# Patient Record
Sex: Female | Born: 1945 | Race: White | Hispanic: No | Marital: Married | Smoking: Never smoker
Health system: Southern US, Community
[De-identification: ages and names within clinical notes are randomized; demographics above are authoritative.]

## PROBLEM LIST (undated history)

## (undated) DIAGNOSIS — K589 Irritable bowel syndrome without diarrhea: Secondary | ICD-10-CM

## (undated) DIAGNOSIS — G43909 Migraine, unspecified, not intractable, without status migrainosus: Secondary | ICD-10-CM

## (undated) DIAGNOSIS — L309 Dermatitis, unspecified: Secondary | ICD-10-CM

## (undated) DIAGNOSIS — Z955 Presence of coronary angioplasty implant and graft: Secondary | ICD-10-CM

## (undated) DIAGNOSIS — I341 Nonrheumatic mitral (valve) prolapse: Secondary | ICD-10-CM

## (undated) DIAGNOSIS — E785 Hyperlipidemia, unspecified: Secondary | ICD-10-CM

## (undated) DIAGNOSIS — K219 Gastro-esophageal reflux disease without esophagitis: Secondary | ICD-10-CM

## (undated) DIAGNOSIS — C4490 Unspecified malignant neoplasm of skin, unspecified: Secondary | ICD-10-CM

## (undated) DIAGNOSIS — N189 Chronic kidney disease, unspecified: Secondary | ICD-10-CM

## (undated) DIAGNOSIS — K5909 Other constipation: Secondary | ICD-10-CM

## (undated) DIAGNOSIS — I10 Essential (primary) hypertension: Secondary | ICD-10-CM

## (undated) DIAGNOSIS — I2510 Atherosclerotic heart disease of native coronary artery without angina pectoris: Secondary | ICD-10-CM

## (undated) DIAGNOSIS — D649 Anemia, unspecified: Secondary | ICD-10-CM

## (undated) DIAGNOSIS — K279 Peptic ulcer, site unspecified, unspecified as acute or chronic, without hemorrhage or perforation: Secondary | ICD-10-CM

## (undated) HISTORY — DX: Atherosclerotic heart disease of native coronary artery without angina pectoris: I25.10

## (undated) HISTORY — DX: Unspecified malignant neoplasm of skin, unspecified: C44.90

## (undated) HISTORY — DX: Presence of coronary angioplasty implant and graft: Z95.5

## (undated) HISTORY — PX: CHOLECYSTECTOMY: SHX55

## (undated) HISTORY — DX: Essential (primary) hypertension: I10

## (undated) HISTORY — DX: Peptic ulcer, site unspecified, unspecified as acute or chronic, without hemorrhage or perforation: K27.9

## (undated) HISTORY — DX: Anemia, unspecified: D64.9

## (undated) HISTORY — DX: Dermatitis, unspecified: L30.9

## (undated) HISTORY — PX: ABDOMINAL HYSTERECTOMY: SHX81

## (undated) HISTORY — DX: Gastro-esophageal reflux disease without esophagitis: K21.9

## (undated) HISTORY — PX: APPENDECTOMY: SHX54

## (undated) HISTORY — DX: Other constipation: K59.09

## (undated) HISTORY — DX: Nonrheumatic mitral (valve) prolapse: I34.1

## (undated) HISTORY — DX: Migraine, unspecified, not intractable, without status migrainosus: G43.909

## (undated) HISTORY — PX: TENDON REPAIR: SHX5111

## (undated) HISTORY — DX: Hyperlipidemia, unspecified: E78.5

## (undated) HISTORY — DX: Chronic kidney disease, unspecified: N18.9

## (undated) HISTORY — PX: TRIGGER FINGER RELEASE: SHX641

---

## 1987-05-10 DIAGNOSIS — K279 Peptic ulcer, site unspecified, unspecified as acute or chronic, without hemorrhage or perforation: Secondary | ICD-10-CM

## 1987-05-10 HISTORY — DX: Peptic ulcer, site unspecified, unspecified as acute or chronic, without hemorrhage or perforation: K27.9

## 1996-05-09 HISTORY — PX: HAND SURGERY: SHX662

## 2002-05-09 DIAGNOSIS — K589 Irritable bowel syndrome without diarrhea: Secondary | ICD-10-CM

## 2002-05-09 HISTORY — DX: Irritable bowel syndrome, unspecified: K58.9

## 2003-06-30 DIAGNOSIS — K219 Gastro-esophageal reflux disease without esophagitis: Secondary | ICD-10-CM | POA: Insufficient documentation

## 2004-05-09 HISTORY — PX: SIGMOID RESECTION / RECTOPEXY: SUR1294

## 2004-08-07 ENCOUNTER — Emergency Department: Payer: Self-pay | Admitting: Unknown Physician Specialty

## 2004-08-12 ENCOUNTER — Inpatient Hospital Stay: Payer: Self-pay | Admitting: Endocrinology

## 2004-09-09 ENCOUNTER — Inpatient Hospital Stay: Payer: Self-pay | Admitting: Surgery

## 2005-05-09 HISTORY — PX: CARDIAC SURGERY: SHX584

## 2005-09-28 ENCOUNTER — Other Ambulatory Visit: Payer: Self-pay

## 2005-09-28 ENCOUNTER — Ambulatory Visit: Payer: Self-pay | Admitting: Cardiology

## 2005-09-28 DIAGNOSIS — I2510 Atherosclerotic heart disease of native coronary artery without angina pectoris: Secondary | ICD-10-CM | POA: Insufficient documentation

## 2006-02-07 ENCOUNTER — Ambulatory Visit: Payer: Self-pay | Admitting: Family Medicine

## 2006-05-09 HISTORY — PX: CORONARY ARTERY BYPASS GRAFT: SHX141

## 2006-11-13 ENCOUNTER — Ambulatory Visit: Payer: Self-pay | Admitting: Cardiology

## 2006-11-28 ENCOUNTER — Ambulatory Visit: Payer: Self-pay | Admitting: Thoracic Surgery (Cardiothoracic Vascular Surgery)

## 2006-11-30 ENCOUNTER — Encounter: Payer: Self-pay | Admitting: Thoracic Surgery (Cardiothoracic Vascular Surgery)

## 2006-11-30 ENCOUNTER — Ambulatory Visit (HOSPITAL_COMMUNITY)
Admission: RE | Admit: 2006-11-30 | Discharge: 2006-11-30 | Payer: Self-pay | Admitting: Thoracic Surgery (Cardiothoracic Vascular Surgery)

## 2006-12-04 ENCOUNTER — Inpatient Hospital Stay (HOSPITAL_COMMUNITY)
Admission: RE | Admit: 2006-12-04 | Discharge: 2006-12-08 | Payer: Self-pay | Admitting: Thoracic Surgery (Cardiothoracic Vascular Surgery)

## 2006-12-04 ENCOUNTER — Ambulatory Visit: Payer: Self-pay | Admitting: Thoracic Surgery (Cardiothoracic Vascular Surgery)

## 2006-12-21 ENCOUNTER — Ambulatory Visit: Payer: Self-pay | Admitting: Thoracic Surgery (Cardiothoracic Vascular Surgery)

## 2006-12-21 ENCOUNTER — Encounter
Admission: RE | Admit: 2006-12-21 | Discharge: 2006-12-21 | Payer: Self-pay | Admitting: Thoracic Surgery (Cardiothoracic Vascular Surgery)

## 2007-08-02 DIAGNOSIS — R931 Abnormal findings on diagnostic imaging of heart and coronary circulation: Secondary | ICD-10-CM | POA: Insufficient documentation

## 2007-08-21 ENCOUNTER — Ambulatory Visit: Payer: Self-pay | Admitting: Thoracic Surgery (Cardiothoracic Vascular Surgery)

## 2008-02-19 ENCOUNTER — Inpatient Hospital Stay: Payer: Self-pay | Admitting: Internal Medicine

## 2008-02-22 ENCOUNTER — Emergency Department: Payer: Self-pay | Admitting: Emergency Medicine

## 2008-08-07 IMAGING — CR DG CHEST 1V PORT
1 series · 1 of 1 positions shown · non-contrast
Comparison: 11/30/2006.

Exam: Chest, one view.

HISTORY: A status post CABG.

[view not recorded]
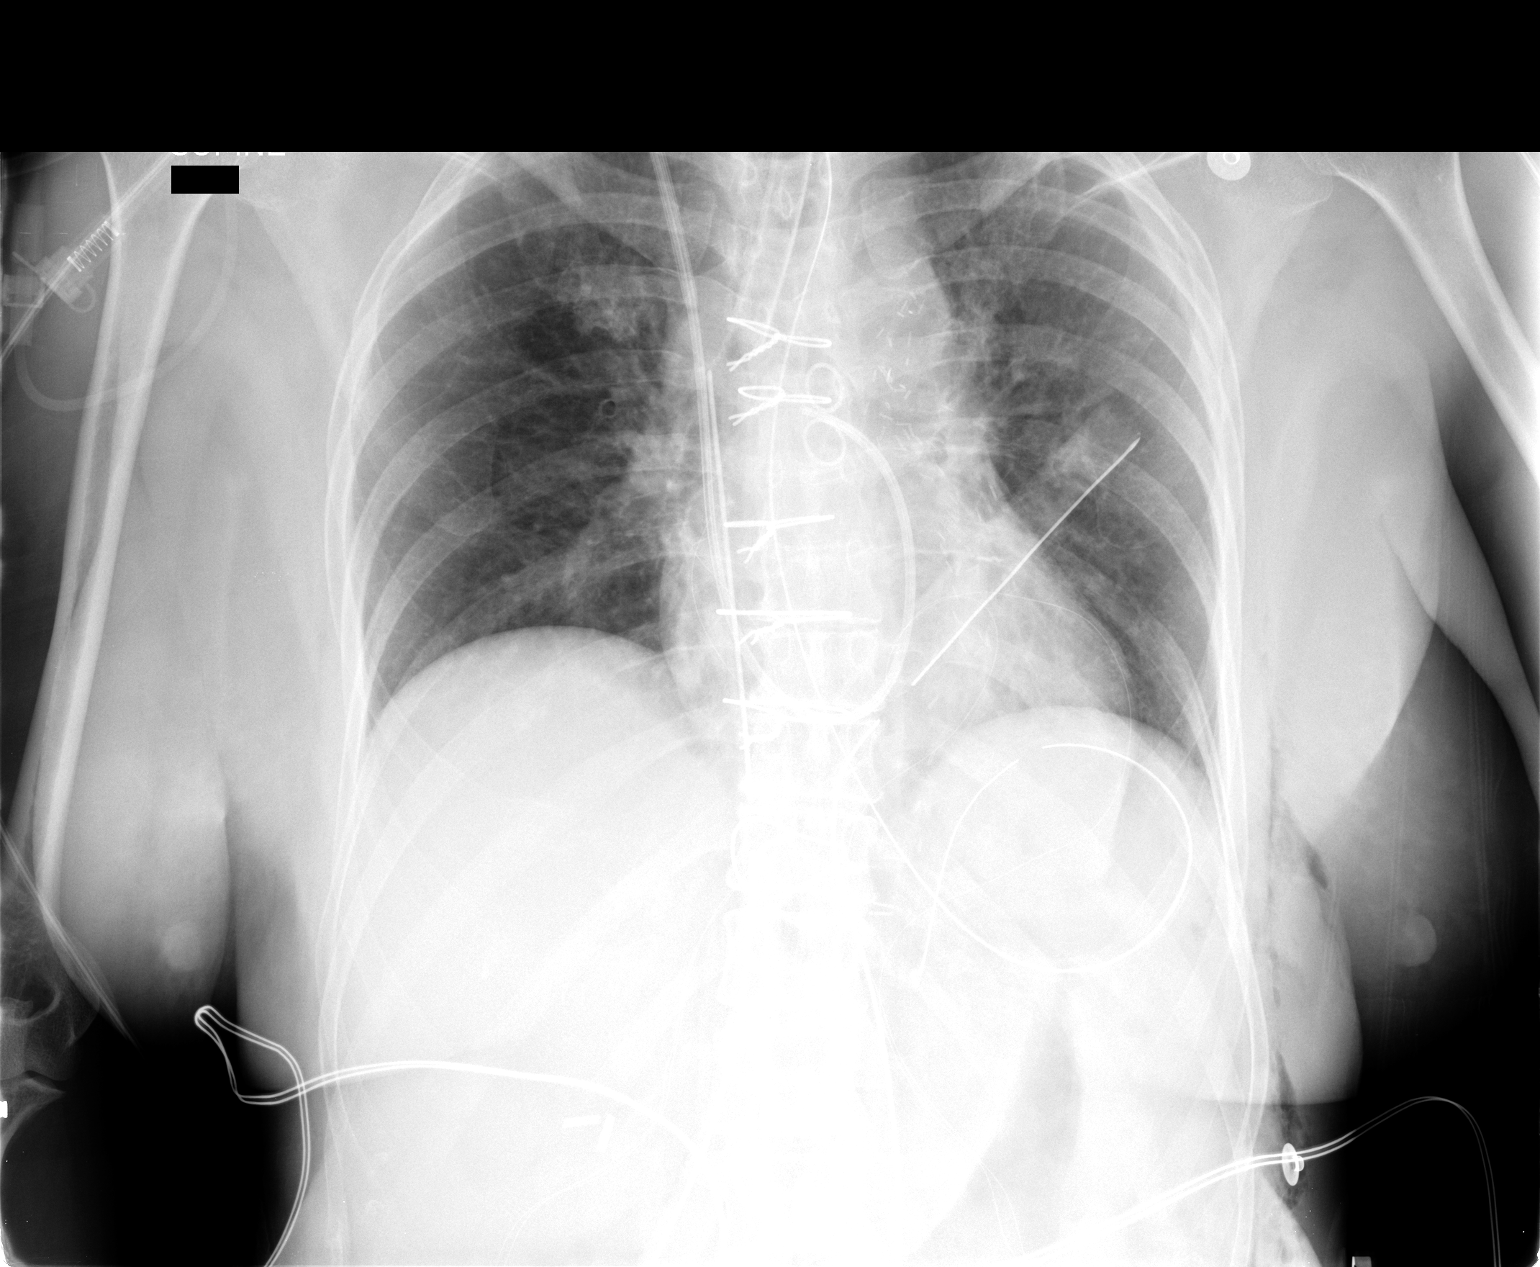

[1 of 1 positions shown; findings below may reference images not displayed]

FINDINGS: The ET tube tip is just above the carina and directed towards the
right mainstem bronchus.

Swan-Ganz catheter tip is in the main pulmonary artery

There is a mediastinal drain and left-sided chest tube in place. No pneumothorax
is identified.

Heart size is normal there is no effusions. No pulmonary edema or airspace
disease is identified.
IMPRESSION: 1. Expected postoperative changes from a CABG procedure.
2. Left-sided chest tube without pneumothorax.

## 2008-08-10 IMAGING — CR DG CHEST 2V
2 series · 2 of 2 positions shown · non-contrast
Comparison: none

CLINICAL DATA: CABG.  
 CHEST - 2 VIEWS: 
 Comparison is made to yesterday?s exam.

[w chest pa]
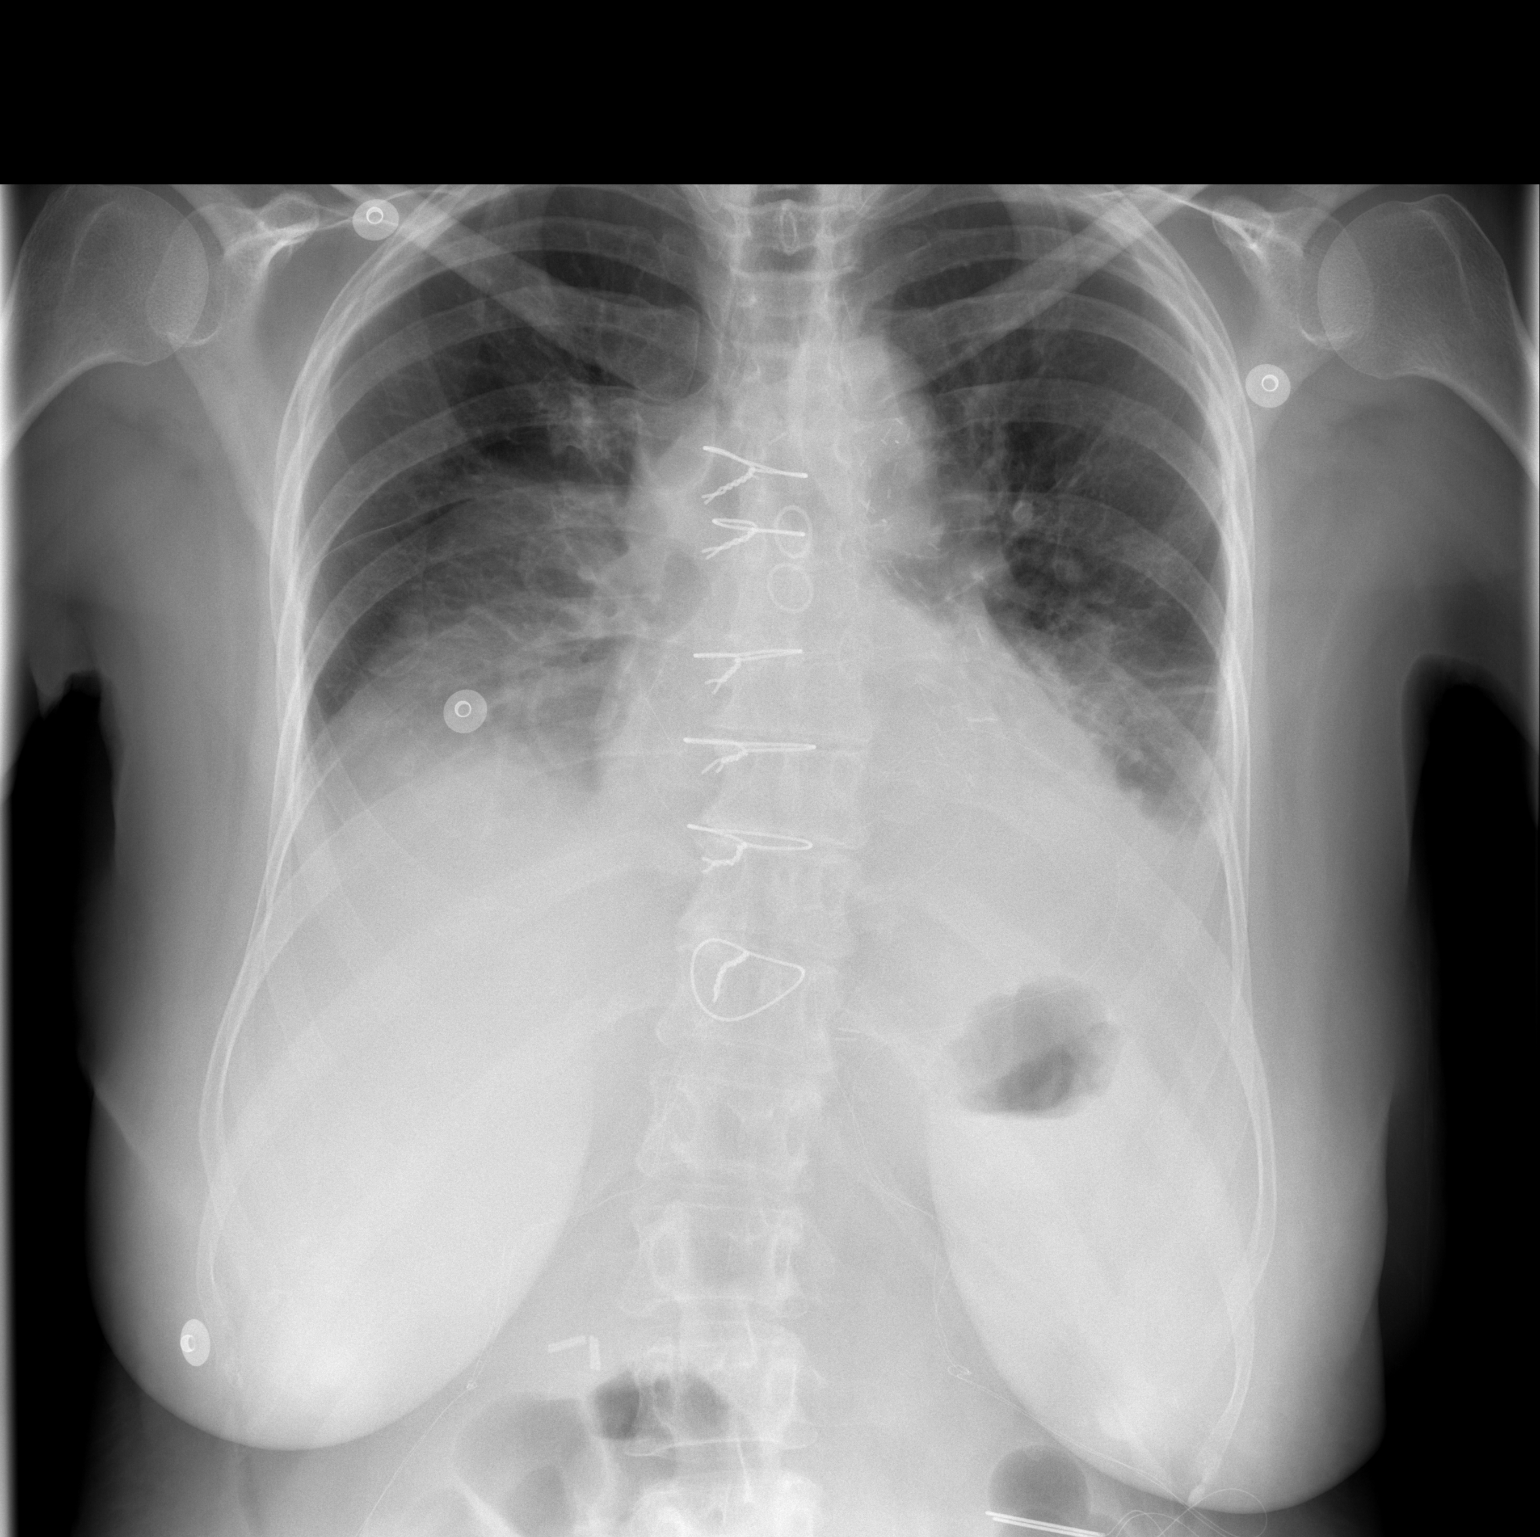

[w chest lat]
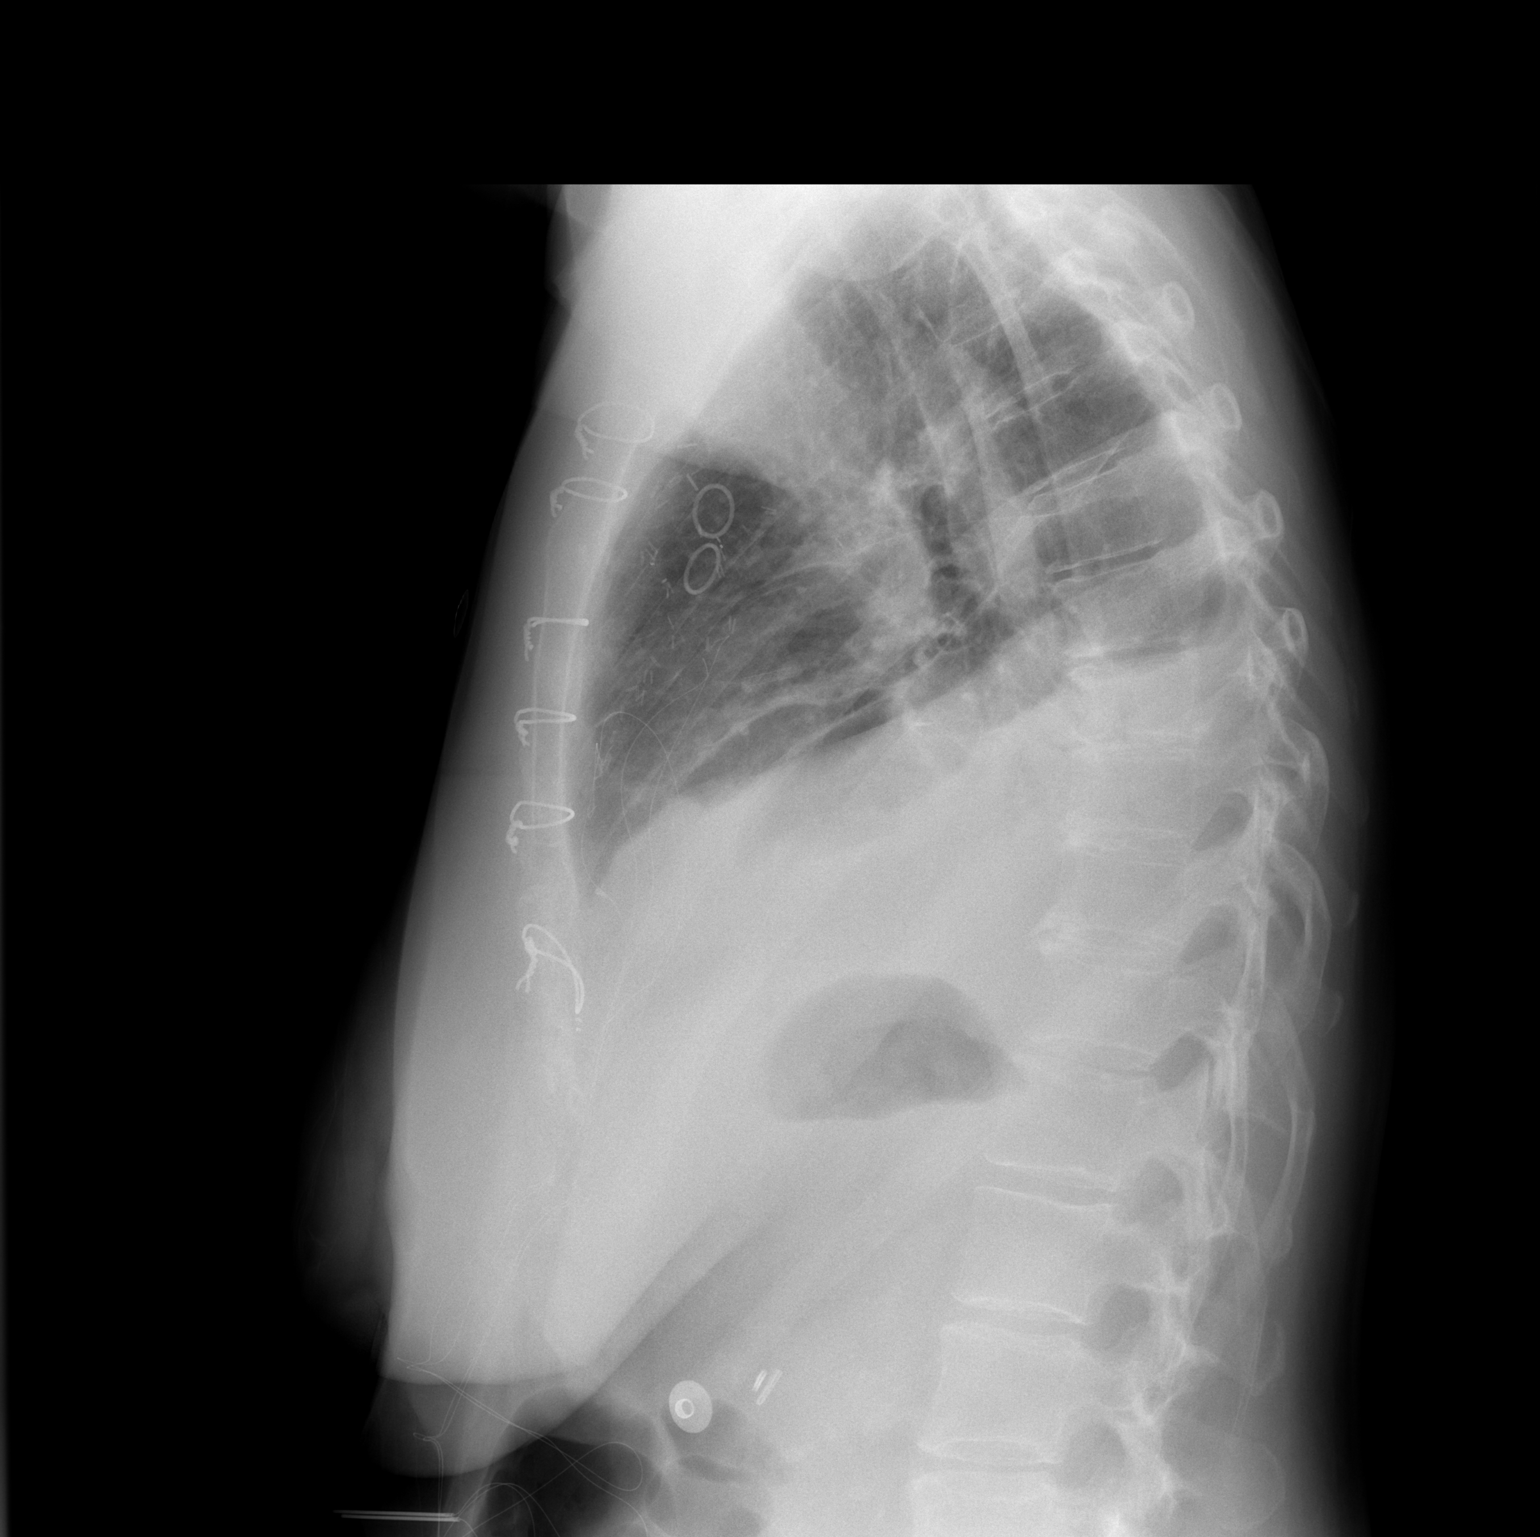

[2 of 2 positions shown; findings below may reference images not displayed]

FINDINGS: The catheter sheath has been removed.  Bilateral lower lobar atelectasis and small pleural effusions are again appreciated.  No pneumothorax.  IMPRESSION: 
 Bilateral lower lobar atelectasis/consolidations and pleural effusions persist.

## 2008-10-13 ENCOUNTER — Ambulatory Visit: Payer: Self-pay | Admitting: Unknown Physician Specialty

## 2008-11-13 ENCOUNTER — Ambulatory Visit: Payer: Self-pay | Admitting: Family Medicine

## 2008-12-01 ENCOUNTER — Ambulatory Visit: Payer: Self-pay | Admitting: Unknown Physician Specialty

## 2009-05-12 ENCOUNTER — Ambulatory Visit: Payer: Self-pay | Admitting: Oncology

## 2009-08-14 ENCOUNTER — Ambulatory Visit: Payer: Self-pay | Admitting: Nephrology

## 2010-02-16 ENCOUNTER — Ambulatory Visit: Payer: Self-pay | Admitting: Internal Medicine

## 2010-05-09 HISTORY — PX: CATARACT EXTRACTION: SUR2

## 2010-05-12 ENCOUNTER — Ambulatory Visit: Payer: Self-pay | Admitting: Oncology

## 2010-05-18 ENCOUNTER — Ambulatory Visit: Payer: Self-pay | Admitting: Unknown Physician Specialty

## 2010-05-19 LAB — PATHOLOGY REPORT

## 2010-06-09 ENCOUNTER — Ambulatory Visit: Payer: Self-pay | Admitting: Oncology

## 2010-07-08 ENCOUNTER — Ambulatory Visit: Payer: Self-pay | Admitting: Oncology

## 2010-07-14 ENCOUNTER — Ambulatory Visit: Payer: Self-pay | Admitting: Unknown Physician Specialty

## 2010-09-21 NOTE — Letter (Signed)
December 21, 2006   Isaias Cowman, M.D.   Re:  MALEENA, KAISER              DOB:  [birth year]   Dear Cristie Hem:   I saw Jontel Hartshorn in the office today.  As you know, she is a very  nice 65 year old woman who was recently found to have an ostial 70% left  main stenosis at catheterization.  I did coronary bypass grafting x3,  grafting her LAD, diagonal, and obtuse marginal on July 28th.  She  returned to the office today for followup, and is really doing well at  this point in time.  She is having some difficulty with her lack of  energy and stamina, but I reassured her that this should improve  dramatically over the next 2 to 3 weeks.  Overall, I think she is on  course for a normal recovery.   Thank you very much for allowing me to see Ms. Reidel, and to  participate in her care.  I would be happy to see her again at any time  if I could be of any further assistance.  Please do not hesitate to  contact me.   Revonda Standard Roxan Hockey, M.D.  Electronically Signed   SCH/MEDQ  D:  12/21/2006  T:  12/22/2006  Job:  [id number]   cc:   Juluis Pitch

## 2010-09-21 NOTE — Assessment & Plan Note (Signed)
OFFICE VISIT   [name], [name]  DOB:  12/09/1945                                        December 21, 2006  CHART #:  [id number]   [name] is a 65 year old woman who underwent coronary artery bypass  grafting x3 on July 28.  She had left main disease.  We did a left  mammary to her LAD, a saphenous vein graft to her first diagonal, as  well as a saphenous vein graft to her first obtuse marginal.  The vein  was harvested endoscopically.  She did well postoperatively and did not  have any significant complications.  She was mildly fluid overloaded and  was discharged home on Lasix for a week.   Since that time, she states that she is having minimal incisional pain  but still is taking 1-2 pain tablets a day.  She also describes a funny  breathing issue that occurs sporadically where she takes short, gasping  breaths for a few seconds and then it resolves spontaneously.  She says  this has happened a couple of times a day for the past 3 to 4 days.   PHYSICAL EXAMINATION:  GENERAL:  Ms. [name] is a 65 year old white  female in no acute distress.  VITAL SIGNS:  Blood pressure 128/76, pulse 76, respirations 18, O2  saturation 99% on room air.  RESPIRATORY:  She has decreased breath sounds at the left base,  otherwise lungs are clear.  CARDIAC:  Regular rate and rhythm, normal S1 and S2.  Her incisions are  healing well with no sign of infection. Her sternum is stable.  She has  no peripheral edema.   Chest x-ray shows a small left effusion.   IMPRESSION:  Ms. [name] really is doing quite well at this point in  time.  She is just about 2-1/2 weeks out from surgery.  She is very  anxious but I tried to reassure her that all of her symptoms were normal  for someone after undergoing a major operation and the she is still very  early in the process of recovery and it would take really several more  months for her to fully recover; although I suspect that  within the next  2 to 3 weeks you will see a significant increase in her energy level and  stamina.   I gave her a prescription for oxycodone 5 mg tablets, 1 to 2 as needed  for pain, 20 tablets.  She was instructed to call during office hours if  she needs additional pain medication; we would be happy to call that in  for her.  She has an appointment to see Dr. Saralyn Pilar next Wednesday.  At this point, with her wounds healing well, unless she has additional  problems, I will not ask her to come back to [facility] to see me but to  continue to follow up with Dr. Saralyn Pilar, but I would be happy to see  her at any time if I can be of any further assistance with her care.   Revonda Standard Roxan Hockey, M.D.  Electronically Signed   SCH/MEDQ  D:  12/21/2006  T:  12/22/2006  Job:  [id number]   cc:   Isaias Cowman, MD  Juluis Pitch

## 2010-09-21 NOTE — H&P (Signed)
NAMEDAMON, RENNISON            ACCOUNT NO.:  [id number]   MEDICAL RECORD NO.:  [id number]          PATIENT TYPE:  OUT   LOCATION:  VASC                         FACILITY:  [place]   PHYSICIAN:  Glynda Jaeger, M.D.  DATE OF BIRTH:  May 04, 1946   DATE OF ADMISSION:  11/30/2006  DATE OF DISCHARGE:  11/30/2006                              HISTORY & PHYSICAL   PROCEDURE:  Intraoperative transesophageal echocardiography.   Ms. [name] is a 65 year old white female with a history of  coronary artery disease and shortness of breath with dyspnea on  exertion.  She was also felt to possibly have mitral valve prolapse.  Intraoperative transesophageal echocardiography which was requested to  evaluate the mitral valve to assess for any other valvular pathology and  to assess left ventricular and right ventricular function.   The patient was brought to the operating room at [facility] and  general anesthesia was induced without difficulty.  The trachea was  intubated without difficulty.  The transesophageal echocardiography  probe was then inserted into the esophagus without difficulty.   IMPRESSION:  1. Mitral valve:  The mitral valve appeared normal.  There was no      evidence of prolapse or fluttering.  There was trace mitral      insufficiency with a central jet.  There was mild mitral annular      calcification.  There was normal leaflet excursion.  2. Aortic valve:  The aortic valve opened normally.  There was mild      thickening of the leaflets.  There was no evidence of aortic      insufficiency.  3. Left ventricle:  There was normal left ventricular function.  Left      ventricular wall thickness measured 0.95 cm in diastole at the mid      papillary level of the anterior and posterior walls.  There was      good contractility in all segments.  Interrogated ejection fraction      was estimated at 60%.  There was no thrombus noted in the left      ventricular  apex.  4. Right ventricle:  The right ventricular function appeared normal.      There was good contractility of the right ventricular free wall and      normal right ventricular size.  5. Tricuspid valve:  There was 2+ tricuspid insufficiency, which      appeared to be in a central jet, but the tricuspid valve      structurally appeared to be intact.  6. Interatrial septum:  The interatrial septum was intact without      evidence of patent foramen ovale by color Doppler and bubble study.  7. Left atrium:  The left atrium showed no evidence of thrombus either      in the left atrial cavity or left atrial appendage.  8. Ascending aorta:  The ascending aorta appeared to have a normal      contour, mild thickening of the wall but no evidence of significant      atheromatous  disease.  9. Descending aorta:  The descending aorta appeared normal and      measured 1.95 cm in diameter with no significant atheromatous      disease.          ______________________________  Glynda Jaeger, M.D.    DCJ/MEDQ  D:  12/04/2006  T:  12/05/2006  Job:  [id number]

## 2010-09-21 NOTE — Op Note (Signed)
[name], [name]            ACCOUNT NO.:  [id number]   MEDICAL RECORD NO.:  [id number]          PATIENT TYPE:  INP   LOCATION:  2312                         FACILITY:  [facility]   PHYSICIAN:  Revonda Standard. Roxan Hockey, M.D.DATE OF BIRTH:  Aug 09, 1945   DATE OF PROCEDURE:  12/04/2006  DATE OF DISCHARGE:                               OPERATIVE REPORT   PREOPERATIVE DIAGNOSIS:  Left main coronary disease with progressive  angina.   POSTOPERATIVE DIAGNOSIS:  Left main coronary disease with progressive  angina.   PROCEDURE:  The median sternotomy, off-pump coronary bypass grafting x3  (left internal mammary artery to LAD, saphenous vein graft to first  diagonal, saphenous vein graft to obtuse marginal one), endoscopic vein  harvest left thigh.   SURGEON:  Dr. Roxan Hockey   ASSISTANT:  Janese Banks, SA   ANESTHESIA:  General.   FINDINGS:  Small but good-quality targets.  Transesophageal  echocardiography revealed no evidence of mitral prolapse.  There was  trace mitral regurgitation, 2+ tricuspid regurgitation.   CLINICAL NOTE:  The patient is a 65 year old woman who presents with  progressive angina.  She has preexisting coronary disease and has  previously had a stent to her LAD.  She underwent cardiac  catheterization was found to have a 70% ostial left main stenosis.  There also was significant stenosis in the takeoff of the first diagonal  branch of the LAD.  The patient was advised to undergo coronary bypass  grafting for survival benefit and relief of symptoms in the setting of  left main disease.  Indications, risks, benefits and alternatives were  discussed in detail with the patient as was the possibility of  proceeding with off-pump grafting if technically possible. The patient  understood, accepted risks of surgery agreed to proceed.   OPERATIVE NOTE:  The patient was brought to the preop holding area on  December 04, 2006.  There lines placed by anesthesia for  monitoring  arterial, central venous and pulmonary arterial pressure.  ECG leads  placed for continuous telemetry.  Intravenous antibiotics were  administered.  She was taken to the operating room, anesthetized and  intubated.  A Foley catheter was placed.  The chest, abdomen and legs  were prepped and draped in the usual sterile fashion.  Transesophageal  echocardiography was performed by Dr. Roberts Gaudy and revealed good  left ventricular function good right ventricular function with no  significant prolapse of the mitral valve.  There was trace mitral  insufficiency.  There was 2+ tricuspid insufficiency.   A median sternotomy was performed and the left internal mammary artery  was harvested using standard technique.  Simultaneously incision in the  medial aspect of the left leg at the level of the knee and greater  saphenous vein was harvested from the left thigh endoscopically.  The  saphenous vein was of good quality as was the mammary artery. The  heparin dose was administered prior to dividing the distal end of the  mammary artery with good flow through the cut end of the vessel.   The pericardium was opened.  The ascending aorta was inspected and was  free of atherosclerotic disease.  The heart itself was normal size with  good left ventricular function.  The patient was placed in steep  Trendelenburg position.  Deep pericardial stay sutures were placed and  covered with Rumel tourniquets to prevent epicardial laceration.  Traction on these prolapsed the heart anteriorly and out of the chest.  The patient tolerated this well hemodynamically. Using the Guidant  Expose off-pump grafting system the following distal anastomoses were  performed.   First the apex of the heart was retracted anteriorly and to the right  exposing the posterior lateral aspect of the heart.  The stabilizing bar  was placed at site selected for anastomosis on the obtuse marginal. The  arteriotomy was  performed, a 2-mm shunt would not pass but 1.5 mm shunt  passed in the vessel easily. Reversed saphenous vein graft was  anastomosed end-to-side with a running 7-0 Prolene suture and there was  good hemostasis.  Good stabilization.  The patient tolerated this well  hemodynamically with no signs of ischemia. At completion anastomosis the  shunt was removed and heparinized saline was infused through the graft  with excellent flow. There was good hemostasis.   Next the apex of the heart was retracted more inferiorly to expose the  anterolateral wall and first diagonal branch LAD was identified and was  a small vessel, too small for a shunt to be used.  Therefore a Silastic  tape was placed proximally.  Traction was placed on the tape to allow  visualization and arteriotomy was performed.  This was a 1 mm vessel.  The vein graft was of good quality and was anastomosed end-to-side with  running 7-0 Prolene suture.  The anastomosis was probed proximally,  distally. At completion there was adequate flow through the graft and  good hemostasis at the anastomosis.   Next the left internal mammary artery was brought through a window in  the pericardium.  The distal end was beveled and was anastomosed end-to-  side to the LAD.  The LAD was stabilized. An arteriotomy was performed  with 1.5 mm shunt was placed in the vessel.  The vessel barely  accommodated the 1.5 mm shunt and the anastomosis was performed with  running 0 Prolene suture.  An attempt to remove the shunt revealed the  suture had traversed the shunt.  Therefore the suture was removed.  The  anastomosis was taken down the mammary was recut. The shunt was removed  Silastic tapes were placed proximally and distally and the anastomosis  was reperformed with a running 8-0 Prolene suture.  This time without a  shunt in place. The anastomosis and mammary probed easily with a 1.5-mm  probe at the completion of the anastomosis. The anastomosis  had good  hemostasis.  The mammary pedicle was tacked epicardial surface with 6-0  Prolene sutures.   The heart then was allowed to rest in its natural position.  The patient  had no hemodynamic instability or signs of ischemia during any the  anastomoses. The vein grafts then were cut to length and the proximal  vein graft anastomoses were performed to 4.3 mm punch aortotomies using  the heart string three proximal anastomotic device.  The patient  remained in Trendelenburg position until after the completion of the  proximal anastomoses and de-airing of the vein grafts. All proximal and  distal anastomoses were inspected for hemostasis, epicardial pacing  wires were placed on the right ventricle and right atrium.  A test dose  of  protamine was administered and was well tolerated. The remaining  protamine was administered without incident.  The pericardium was  reapproximated over the heart with interrupted 3-0 silk sutures.  It  came together easily without tension. The left pleural and single  mediastinal chest tubes were placed through separate subcostal  incisions.  The sternum was closed with interrupted heavy gauge  stainless steel wires.  The patient transiently had drop in the cardiac  output with otherwise normal hemodynamics. She was however bradycardic  and she was atrially paced at 90 beats per minute. Volume administration  resulted in  marked improvement in her cardiac index without need for inotropic  support. The remaining incisions closed in standard fashion.  Subcuticular closure used for the skin.  All sponge, needle and sponge  counts were correct at the end of procedure.  The patient is taken from  the operating room to the surgical intensive care unit in good  condition.      Revonda Standard Roxan Hockey, M.D.  Electronically Signed     SCH/MEDQ  D:  12/04/2006  T:  12/05/2006  Job:  [id number]   cc:   Isaias Cowman, M.D.

## 2010-09-21 NOTE — Consult Note (Signed)
NEW PATIENT CONSULTATION   [name], [name]  DOB:  [birth year]                                        November 28, 2006  CHART #:  [id number]   PRIMARY CARDIOLOGIST:  Dr. Isaias Cowman, [facility].   CHIEF COMPLAINT:  Chest tightness.   HISTORY OF PRESENT ILLNESS:  The patient is a 65 year old, white female  with a history of coronary artery disease dating back to March 2007.  At  that time, she had chest pain symptoms and underwent evaluation  culminating in a bare metal stent placement in the LAD on July 29, 2005.  Most recently she has developed symptoms of increasing chest  tightness symptoms with minimal activity.  She also describes left hand  numbness and tingling and pain that does radiate to the left arm and  sometimes to the jaw region.  This is also associated with shortness of  breath, dyspnea on exertion and two-pillow orthopnea.  She denies  paroxysmal nocturnal dyspnea.  She has a nonproductive cough  intermittently.  She also admits to polyuria and nocturia.  She denies  diaphoresis associated with the symptoms, but does have occasional  nausea and vomiting.  She has persistent reflux symptoms.  She denies  TIA or cerebrovascular accident symptoms.  She denies palpitations,  denies history of arrhythmias.  She does admit to increasing fatigue.  She underwent cardiac catheterization by Dr. Saralyn Pilar on November 13, 2006.  This revealed an ostial 70% left main lesion.  Additionally, there was a  60% lesion diagonal-1 coronary artery.  There was also a proximal 30%,  nonobstructive right coronary lesion.  The stent was felt to be patent.  Due to the increasing symptoms and the severity of the left main  anatomy, she has been recommended surgical revascularization and she was  referred to Dr. Modesto Charon for evaluation.  Dr. Roxan Hockey has  evaluated the patient and her studies and agrees with recommendations to  proceed with surgery.   PAST MEDICAL HISTORY:  1. Coronary artery disease as described above.  2. Hypertension.  3. Hyperlipidemia.  4. Irritable bowel syndrome.  5. History of a heart murmur with a definitive diagnosis in 2005, of      mitral valve prolapse.  She is uncertain of the actual method of      this diagnosis, but states that her heart murmur symptoms date back      to 1991.   PAST SURGICAL HISTORY:  1. Partial sigmoid resection in 2006.  2. Total hysterectomy in 1982.  3. Left carpal tunnel in 2003.  4. Right carpal tunnel in 2004.  5. Cholecystectomy.  6. Three surgeries for trigger fingers bilateral hands.   ALLERGIES:  PENICILLIN which causes a rash.  COMPAZINE she reports  worsening nausea and vomiting.  REGLAN causes neurological symptoms.  She also reports intolerance to all STATINS primarily muscle cramping in  the lower extremities.   CURRENT MEDICATIONS:  1. Aspirin 81 mg daily.  2. Magnesium supplements daily.  3. Metoprolol ER 50 mg daily.  4. MiraLax daily.  5. Omeprazole 20 mg p.r.n.  6. Triamterene/hydrochlorothiazide 37.5/25 mg daily.  7. Vitamin B and vitamin C daily.   REVIEW OF SYSTEMS:  See the history of present illness for pertinent  positives and negative.  Otherwise, it is notable for persistent reflux  symptoms and  chronic constipation.   SOCIAL HISTORY:  She is married with no children.  No tobacco use.  No  alcohol use.  She is currently unemployed.  She formerly worked in  Veterinary surgeon type work.   FAMILY HISTORY:  Mother deceased at age 73 from cerebrovascular  accident.  Father deceased at age 75.  He had both extracranial  cerebrovascular occlusive disease as well as a history of coronary  revascularization and history of stroke.  She has one brother who is  alive.  He does have diabetes mellitus.   PHYSICAL EXAMINATION:  Vital Signs:  Blood pressure 129/76, heart rate  62, respirations 18, oxygen saturations 98% on room air.  General:  This  is a  healthy-appearing, 65 year old, Caucasian female in no acute  distress.  HEENT:  Normocephalic, atraumatic.  Pupils equal round and  reactive to light.  Extraocular movements intact.  Oral mucosa is pink  and moist.  There are no lesions.  Sclerae nonicteric.  She does have  partial top dentition.  Neck:  Supple, no jugular venous distention.  No  bruits.  No lymphadenopathy.  Pulmonary:  Symmetrical on inspiration,  unlabored.  Clear breath sounds without wheezes, rhonchi or crackles.  Cardiac:  Regular rate and rhythm without murmurs, rubs or gallops.  Abdomen:  Soft, nontender, nondistended, normoactive bowel sounds, no  masses, no bruits, no hepatosplenomegaly.  Genitalia/Rectal:  Deferred.  Extremities:  No edema, no varicosities, no venous stasis changes.  She  has a palpable dorsalis pedis and posterior tibial bilaterally.  Neurologic:  Nonfocal.  She is alert and oriented x4.  Gait is steady.  Muscle strength is 5+ and equal bilaterally.  All findings are  symmetrical.   ASSESSMENT:  Left main coronary artery disease with unstable angina  symptoms.   PLAN:  Coronary artery bypass graft by Dr. Roxan Hockey early next week.  She will also undergo a transesophageal echocardiogram at that time to  better evaluate her valves and if there are any findings that require  surgical intervention, this will be included in her procedure.  Dr.  Roxan Hockey has evaluated the patient and discussed all risks, benefits  and alternatives and she agrees to proceed.   John Giovanni, P.A.-C.   Loren Racer  D:  11/28/2006  T:  11/28/2006  Job:  [id number]

## 2010-09-21 NOTE — Letter (Signed)
November 28, 2006   Dr. Peggye Form  [place], [facility]   Re:  [name], [name]              DOB:  Sep 24, 1945   Dear [name]:   I saw [name] in the office today.  As you know, she is a very  nice 65 year old woman who has recently been having significant  exertional anginal symptoms.  At catheterization, she had an ostial 70%  left main stenosis with damping of the catheter.  I agree with your  assessment that coronary bypass grafting is indicated, and we have  scheduled her for surgery on Monday, July 28.  She tells me that she has  had a history of mitral valve prolapse, so we will do a TEE  intraoperatively.  Although, I suspect we will not need to do anything  about the valve at the time of surgery.  Again, thank you very much for  sending [name] to see [name], and I will keep you informed of our  progress.   Sincerely,   Revonda Standard. Roxan Hockey, MD   Revonda Standard Roxan Hockey, M.D.  Electronically Signed   SCH/MEDQ  D:  11/28/2006  T:  11/29/2006  Job:  [id number]

## 2010-09-21 NOTE — Discharge Summary (Signed)
[name], [name]            ACCOUNT NO.:  [id number]   MEDICAL RECORD NO.:  [id number]          PATIENT TYPE:  INP   LOCATION:  2039                         FACILITY:  [facility]   PHYSICIAN:  Revonda Standard. 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   IN-HOSPITAL DIAGNOSES:  1. Acute blood loss anemia postoperatively.  2. Volume overload postoperatively.   SECONDARY DIAGNOSES:  1. History of coronary artery disease dating back March 2007, status      post bare metal stent placement in left anterior descending on      July 29, 2005.  2. Hypertension.  3. Hyperlipidemia.  4. Irritable bowel syndrome.  5. History of heart murmur with a definite diagnosis 2005 of mitral      valve prolapse.  6. Status post partial sigmoid resection in 2006.  7. Status post total hysterectomy in 1982.  8. Status post left carpal tunnel in 2003.  9. Status post right carpal tunnel in 2004.  10.Status post cholecystectomy.  11.Status post three surgeries for trigger fingers bilateral hand.   IN-HOSPITAL OPERATIONS AND PROCEDURES:  Off-pump coronary artery bypass  grafting x3 using a left internal mammary artery to left anterior  descending, saphenous vein graft to first diagonal, saphenous vein graft  to obtuse marginal one.  Endoscopic vein harvesting to left side.   HISTORY AND PHYSICAL AND HOSPITAL COURSE:  The patient is a 59-year  female who presents with progressive angina.  She has pre-existing  coronary disease and has previously had a stent to her LAD.  The patient  underwent cardiac catheterization and was found to have 70% ostial left  main stenosis.  There also was significant stenosis in the takeoff of  the first diagonal branch of the LAD.  Following catheterization, the  patient was evaluated by Dr. Roxan Hockey.   Dr. Roxan Hockey discussed  with the patient undergoing coronary artery bypass grafting.  Risks and  benefits were discussed with the patient.  The patient acknowledged her  understanding and agreed to proceed.  Surgery was scheduled for December 04, 2006.  For details of the patient's past medical history and physical  exam, please see dictated H&P.   The patient was taken to the operating room November 26, 2006, where she  underwent off-pump coronary artery bypass grafting x3 using a left  internal mammary artery to left anterior descending, saphenous vein  graft to first diagonal, saphenous vein graft to obtuse marginal one.  The patient had endoscopic vein harvesting from the left thigh.  The  patient tolerated this procedure well and was transferred to the  intensive care unit in stable condition.  Postoperatively, the patient  was noted to be hemodynamically stable.  She was extubated the evening  of surgery.  Post extubation, the patient noted to be alert and oriented  x4.  The patient's postoperative course was pretty much unremarkable.  The patient was able to be weaned from all drips on postop day #1.  The  chest tubes and Swan-Ganz catheter were discontinued postop day #1.  The  patient was out of bed, ambulating with assistance and tolerating diet  well.  She was able to be transferred out to 2000 postop day #2.  The  patient did develop acute blood loss anemia postoperatively.  Hemoglobin  and hematocrit were 8.8 and 26% postoperative day one.  The patient did  not require any transfusion and hemoglobin and hematocrit were  monitored.  Noted that they did remain stable and by postop day #3  slight decrease, but patient asymptomatic with 8.4 and 24.9%.  Postoperatively, the patient was also need to be volume overloaded.  The  patient was started on diuretics postoperatively.  She is still  currently 10 kg above her preoperative weight.  Plan to continue  diuretics at discharge.   Preoperatively, the patient diagnosed with  urinary tract infection.  She was started on ciprofloxacin and continued  postoperatively for seven-day total course.  The patient remained in  normal sinus rhythm postoperatively.  Her pulmonary status was stable.  She was able to be weaned off oxygen, sating greater than 90% on room  air.  The patient's incisions were clean, dry and intact and healing  well.  She was out of bed, ambulating well.  The patient was tolerating  diet well.  Vital signs were noted to remained stable and she remained  afebrile.  Blood sugars were followed postoperatively.  They did remain  stable.  The patient's hemoglobin A1c preoperatively was 5.5.   LABORATORY DATA:  Postop day #3 showed a white count 7.7, hemoglobin of  8.4, hematocrit 24.9, platelet count 161.  Sodium of 140, potassium 3.8,  chloride of 102, bicarbonate 31, BUN of 12, creatinine of 1.26, glucose  113.   Patient is tentatively ready for discharge home in the next one to two  days pending she remains stable.   FOLLOW-UP APPOINTMENTS:  Follow-up appointment has been arranged with  Dr. Roxan Hockey for December 21, 2006, at 1:00 p.m..  The patient will  need to obtain a PA and lateral chest x-ray 30 minutes prior to this  appointment.  The patient will need to follow up with Dr. Leafy Half in 2  weeks.  She will need to contact his office to arrange this appointment.   ACTIVITY:  Patient instructed no driving until released to do so, no  heavy lifting over 10 pounds.  She is told to ambulate three to four  times per day, progress as tolerated.  Continue breathing exercises.   INCISIONAL CARE:  The patient is told to shower, washing her incisions  using soap and water.  She is to contact the office if she develops any  drainage or opening from any of her incision sites.   DIET:  The patient educated on diet to be low-fat, low-salt.   DISCHARGE MEDICATIONS:  1. Aspirin 325 mg daily.  2. Toprol XL 25  mg daily.  3. Prilosec as needed.  4. Ciprofloxacin 500 mg b.i.d. x2 days.  5. Lasix 40 mg daily x7 days.  6. Potassium chloride 20 mEq daily x7 days.  7. MiraLax 17 grams daily.  8. Vitamin B daily.  9. Vitamin C daily.  10.Magnesium oxide 400 mg daily.  11.Dulcolax as used at home.  12.Oxycodone 5 mg 1-2 tablets q.4-6h. p.r.n. pain.      Darlin Coco, PA      Revonda Standard. Roxan Hockey, M.D.  Electronically Signed    KMD/MEDQ  D:  12/07/2006  T:  12/08/2006  Job:  [id number]   cc:   [employer]

## 2010-10-13 ENCOUNTER — Ambulatory Visit: Payer: Self-pay | Admitting: Oncology

## 2010-11-07 ENCOUNTER — Ambulatory Visit: Payer: Self-pay | Admitting: Oncology

## 2011-02-21 LAB — POCT I-STAT 4, (NA,K, GLUC, HGB,HCT)
Glucose, Bld: 86
Glucose, Bld: 87
Glucose, Bld: 95
Glucose, Bld: 95
HCT: 15 — ABNORMAL LOW
HCT: 18 — ABNORMAL LOW
HCT: 19 — ABNORMAL LOW
HCT: 26 — ABNORMAL LOW
Hemoglobin: 5.1 — CL
Hemoglobin: 6.1 — CL
Hemoglobin: 6.5 — CL
Operator id: 241461
Operator id: 3402
Potassium: 2.9 — ABNORMAL LOW
Potassium: 3.2 — ABNORMAL LOW
Potassium: 3.5
Potassium: 3.7
Sodium: 141
Sodium: 143
Sodium: 143

## 2011-02-21 LAB — I-STAT EC8
Acid-base deficit: 2
BUN: 13
BUN: 13
Bicarbonate: 23.7
Chloride: 101
Chloride: 107
Glucose, Bld: 128 — ABNORMAL HIGH
HCT: 23 — ABNORMAL LOW
HCT: 24 — ABNORMAL LOW
Hemoglobin: 7.8 — CL
Hemoglobin: 8.2 — ABNORMAL LOW
Operator id: 252761
Operator id: 274841
Potassium: 3.5
Sodium: 140
Sodium: 141
TCO2: 25
pCO2 arterial: 45.4 — ABNORMAL HIGH
pH, Arterial: 7.326 — ABNORMAL LOW

## 2011-02-21 LAB — CBC
HCT: 24.9 — ABNORMAL LOW
HCT: 25 — ABNORMAL LOW
HCT: 25.5 — ABNORMAL LOW
Hemoglobin: 12.1
Hemoglobin: 7.2 — CL
Hemoglobin: 8.4 — ABNORMAL LOW
Hemoglobin: 8.5 — ABNORMAL LOW
Hemoglobin: 8.6 — ABNORMAL LOW
MCHC: 33.6
MCHC: 33.6
MCHC: 34
MCHC: 34.5
MCV: 84
MCV: 87.6
MCV: 87.8
MCV: 88
Platelets: 158
Platelets: 161
Platelets: 182
Platelets: 190
RBC: 2.49 — ABNORMAL LOW
RBC: 2.83 — ABNORMAL LOW
RBC: 2.86 — ABNORMAL LOW
RBC: 2.91 — ABNORMAL LOW
RBC: 4.21
RDW: 12.3
RDW: 12.6
RDW: 13.3
RDW: 13.3
RDW: 13.6
WBC: 7.3
WBC: 7.7
WBC: 8.3
WBC: 8.7

## 2011-02-21 LAB — TYPE AND SCREEN: Antibody Screen: NEGATIVE

## 2011-02-21 LAB — APTT: aPTT: 22 — ABNORMAL LOW

## 2011-02-21 LAB — POCT I-STAT 3, ART BLOOD GAS (G3+)
Acid-base deficit: 2
Acid-base deficit: 6 — ABNORMAL HIGH
Bicarbonate: 20
Bicarbonate: 20.9
O2 Saturation: 100
O2 Saturation: 84
Operator id: 288291
Patient temperature: 33.1
TCO2: 21
TCO2: 22
TCO2: 22
pCO2 arterial: 25.3 — ABNORMAL LOW
pCO2 arterial: 28.3 — ABNORMAL LOW
pCO2 arterial: 41.3
pH, Arterial: 7.295 — ABNORMAL LOW
pH, Arterial: 7.46 — ABNORMAL HIGH
pH, Arterial: 7.504 — ABNORMAL HIGH
pO2, Arterial: 176 — ABNORMAL HIGH
pO2, Arterial: 208 — ABNORMAL HIGH
pO2, Arterial: 54 — ABNORMAL LOW

## 2011-02-21 LAB — BASIC METABOLIC PANEL
BUN: 12
BUN: 12
CO2: 30
CO2: 31
Chloride: 109
Creatinine, Ser: 1.06
GFR calc Af Amer: 55 — ABNORMAL LOW
GFR calc Af Amer: 58 — ABNORMAL LOW
GFR calc non Af Amer: 45 — ABNORMAL LOW
GFR calc non Af Amer: 44 — ABNORMAL LOW
GFR calc non Af Amer: 48 — ABNORMAL LOW
Glucose, Bld: 100 — ABNORMAL HIGH
Glucose, Bld: 113 — ABNORMAL HIGH
Potassium: 3.6
Potassium: 3.7
Potassium: 3.8
Sodium: 141
Sodium: 139

## 2011-02-21 LAB — URINE MICROSCOPIC-ADD ON

## 2011-02-21 LAB — MAGNESIUM
Magnesium: 1.9
Magnesium: 2.3

## 2011-02-21 LAB — BASIC METABOLIC PANEL WITH GFR
BUN: 13
CO2: 30
Calcium: 8.7
Chloride: 102
Creatinine, Ser: 1.2
GFR calc non Af Amer: 46 — ABNORMAL LOW
Glucose, Bld: 108 — ABNORMAL HIGH
Potassium: 3.7
Sodium: 139

## 2011-02-21 LAB — CREATININE, SERUM
Creatinine, Ser: 0.98
GFR calc Af Amer: >60
GFR calc non Af Amer: 58 — ABNORMAL LOW

## 2011-02-21 LAB — BLOOD GAS, ARTERIAL
Drawn by: 206361
FIO2: 0.21
pCO2 arterial: 42.6
pH, Arterial: 7.374
pO2, Arterial: 89.9

## 2011-02-21 LAB — URINALYSIS, ROUTINE W REFLEX MICROSCOPIC
Glucose, UA: NEGATIVE
Ketones, ur: NEGATIVE
Nitrite: NEGATIVE
Specific Gravity, Urine: 1.015
pH: 6

## 2011-02-21 LAB — POCT I-STAT GLUCOSE
Glucose, Bld: 86
Operator id: 3402

## 2011-02-21 LAB — COMPREHENSIVE METABOLIC PANEL
ALT: 11
AST: 19
Alkaline Phosphatase: 51
CO2: 21
GFR calc Af Amer: 59 — ABNORMAL LOW
Glucose, Bld: 89
Potassium: 4.2
Sodium: 138
Total Protein: 7

## 2011-02-21 LAB — ABO/RH: ABO/RH(D): A POS

## 2011-02-21 LAB — HEMOGLOBIN A1C: Hgb A1c MFr Bld: 5.5

## 2011-02-22 ENCOUNTER — Ambulatory Visit: Payer: Self-pay | Admitting: Internal Medicine

## 2011-05-10 HISTORY — PX: SKIN CANCER EXCISION: SHX779

## 2011-05-10 HISTORY — PX: CORONARY ANGIOPLASTY WITH STENT PLACEMENT: SHX49

## 2011-11-23 ENCOUNTER — Ambulatory Visit: Payer: Self-pay | Admitting: Cardiology

## 2011-11-23 LAB — CK TOTAL AND CKMB (NOT AT ARMC)
CK, Total: 47 U/L (ref 21–215)
CK-MB: 1.3 ng/mL (ref 0.5–3.6)

## 2011-11-24 LAB — BASIC METABOLIC PANEL
BUN: 15 mg/dL (ref 7–18)
Calcium, Total: 8.2 mg/dL — ABNORMAL LOW (ref 8.5–10.1)
EGFR (African American): >60
EGFR (Non-African Amer.): 56 — ABNORMAL LOW
Glucose: 90 mg/dL (ref 65–99)
Osmolality: 285 (ref 275–301)
Potassium: 3.9 mmol/L (ref 3.5–5.1)
Sodium: 143 mmol/L (ref 136–145)

## 2012-03-27 ENCOUNTER — Ambulatory Visit: Payer: Self-pay | Admitting: Internal Medicine

## 2012-04-25 ENCOUNTER — Ambulatory Visit: Payer: Self-pay | Admitting: Cardiology

## 2012-04-25 LAB — BASIC METABOLIC PANEL
Anion Gap: 10 (ref 7–16)
Co2: 22 mmol/L (ref 21–32)
Creatinine: 1.26 mg/dL (ref 0.60–1.30)
EGFR (African American): 52 — ABNORMAL LOW
Osmolality: 283 (ref 275–301)

## 2012-12-27 ENCOUNTER — Ambulatory Visit: Payer: Self-pay | Admitting: Orthopedic Surgery

## 2013-04-01 ENCOUNTER — Ambulatory Visit: Payer: Self-pay | Admitting: Internal Medicine

## 2013-05-06 ENCOUNTER — Ambulatory Visit: Payer: Self-pay | Admitting: Cardiology

## 2013-05-23 ENCOUNTER — Ambulatory Visit: Payer: Self-pay | Admitting: Cardiology

## 2013-05-23 LAB — CK TOTAL AND CKMB (NOT AT ARMC)
CK, Total: 41 U/L (ref 21–215)
CK-MB: 0.9 ng/mL (ref 0.5–3.6)

## 2013-05-24 LAB — BASIC METABOLIC PANEL
ANION GAP: 4 — AB (ref 7–16)
BUN: 21 mg/dL — AB (ref 7–18)
CALCIUM: 7.9 mg/dL — AB (ref 8.5–10.1)
Chloride: 97 mmol/L — ABNORMAL LOW (ref 98–107)
Co2: 36 mmol/L — ABNORMAL HIGH (ref 21–32)
Creatinine: 1.32 mg/dL — ABNORMAL HIGH (ref 0.60–1.30)
EGFR (African American): 49 — ABNORMAL LOW
GFR CALC NON AF AMER: 42 — AB
GLUCOSE: 91 mg/dL (ref 65–99)
Osmolality: 276 (ref 275–301)
Potassium: 2.3 mmol/L — CL (ref 3.5–5.1)
Sodium: 137 mmol/L (ref 136–145)

## 2013-06-20 ENCOUNTER — Encounter: Payer: Self-pay | Admitting: Cardiology

## 2013-07-07 ENCOUNTER — Encounter: Payer: Self-pay | Admitting: Cardiology

## 2013-08-07 ENCOUNTER — Encounter: Payer: Self-pay | Admitting: Cardiology

## 2013-09-06 ENCOUNTER — Encounter: Payer: Self-pay | Admitting: Cardiology

## 2013-09-16 DIAGNOSIS — N184 Chronic kidney disease, stage 4 (severe): Secondary | ICD-10-CM | POA: Insufficient documentation

## 2013-09-16 DIAGNOSIS — K589 Irritable bowel syndrome without diarrhea: Secondary | ICD-10-CM | POA: Insufficient documentation

## 2013-09-16 DIAGNOSIS — N189 Chronic kidney disease, unspecified: Secondary | ICD-10-CM | POA: Insufficient documentation

## 2013-09-16 DIAGNOSIS — N390 Urinary tract infection, site not specified: Secondary | ICD-10-CM | POA: Insufficient documentation

## 2013-09-16 DIAGNOSIS — D649 Anemia, unspecified: Secondary | ICD-10-CM | POA: Insufficient documentation

## 2013-10-07 ENCOUNTER — Encounter: Payer: Self-pay | Admitting: Cardiology

## 2013-12-11 DIAGNOSIS — IMO0001 Reserved for inherently not codable concepts without codable children: Secondary | ICD-10-CM | POA: Insufficient documentation

## 2013-12-11 DIAGNOSIS — E785 Hyperlipidemia, unspecified: Secondary | ICD-10-CM | POA: Insufficient documentation

## 2013-12-11 DIAGNOSIS — I25718 Atherosclerosis of autologous vein coronary artery bypass graft(s) with other forms of angina pectoris: Secondary | ICD-10-CM | POA: Insufficient documentation

## 2013-12-11 DIAGNOSIS — Z789 Other specified health status: Secondary | ICD-10-CM | POA: Insufficient documentation

## 2013-12-11 DIAGNOSIS — I25810 Atherosclerosis of coronary artery bypass graft(s) without angina pectoris: Secondary | ICD-10-CM | POA: Insufficient documentation

## 2013-12-11 DIAGNOSIS — Z955 Presence of coronary angioplasty implant and graft: Secondary | ICD-10-CM | POA: Insufficient documentation

## 2013-12-11 DIAGNOSIS — T50905A Adverse effect of unspecified drugs, medicaments and biological substances, initial encounter: Secondary | ICD-10-CM | POA: Insufficient documentation

## 2013-12-11 DIAGNOSIS — I10 Essential (primary) hypertension: Secondary | ICD-10-CM | POA: Insufficient documentation

## 2013-12-11 DIAGNOSIS — R1110 Vomiting, unspecified: Secondary | ICD-10-CM

## 2014-04-16 ENCOUNTER — Ambulatory Visit: Payer: Self-pay | Admitting: Nephrology

## 2014-04-18 ENCOUNTER — Ambulatory Visit: Payer: Self-pay | Admitting: Internal Medicine

## 2014-08-26 NOTE — Discharge Summary (Signed)
PATIENT NAME:  [name], LOFT MR#:  [id number] DATE OF BIRTH:  [birth year]  DATE OF ADMISSION:  11/23/2011 DATE OF DISCHARGE:  11/24/2011  FINAL DIAGNOSES:  1. Coronary artery disease.  2. Gastroesophageal reflux disease.   DISCHARGE MEDICATIONS:  1. Enteric aspirin 325 mg daily.  2. Plavix 75 mg daily.  3. Metoprolol succinate 25 mg daily.  4. Furosemide 40 mg daily.  5. Hyoscyamine 0.125 mg t.i.d. p.r.n.  6. Magnesium 500 mg daily.  7. Dulcolax 5 mg q.h. p.r.n.  8. Dexilant 60 mg daily.  9. Ranitidine 150 mg at bedtime.  10. Ondansetron 4 mg q.8 p.r.n.  11. Probiotic 1 capsule daily.   PROCEDURES:  1. Cardiac catheterization with selective coronary arteriography on 10/24/2011.  2. Percutaneous coronary intervention with coronary stent mid right coronary artery 11/23/2011.   HISTORY: Please see admission history and physical.   HOSPITAL COURSE: The patient underwent elective cardiac catheterization on 11/22/2011. Coronary arteriography revealed patent LIMA to LAD, patent saphenous vein graft to first obtuse marginal branch and occluded saphenous vein graft to first diagonal branch. There was a 75% stenosis in mid right coronary artery. The patient underwent percutaneous coronary intervention. The patient received a 2.25 x 16 mm drug-eluting Promus stent with an excellent angiographic result. Postprocedural CPK-MB were 47 and 1.3, respectively. On the morning of    11/24/2011 the patient was ambulating without difficulty and was discharged home in stable condition. She is scheduled to see me in follow-up in one week.   ____________________________ Isaias Cowman, MD ap:ap D: 11/24/2011 08:00:32 ET T: 11/24/2011 14:41:17 ET JOB#: [id number]  cc: Isaias Cowman, MD, <Dictator> Isaias Cowman MD ELECTRONICALLY SIGNED 12/06/2011 8:26

## 2014-08-30 NOTE — Discharge Summary (Signed)
PATIENT NAME:  [name], [name] MR#:  [id number] DATE OF BIRTH:  1945/07/29  DATE OF ADMISSION:  05/23/2013 DATE OF DISCHARGE:  05/24/2013  PRIMARY CARE PHYSICIAN:  Dr. Ginette Pitman.    FINAL DIAGNOSES: 1.  Coronary artery disease.  2. Hypertension.  3.  Hyperlipidemia.  4.  Statin intolerance.   MEDICATIONS:   1.  Aspirin 325 mg daily. 2.  Clopidogrel 75 mg daily. 3.  Furosemide 40 mg daily. 4.  Isosorbide mononitrate 60 mg daily. 5.  Metoprolol succinate 50 mg daily. 6.  Potassium chloride 10 mEq daily. 7.  Ranexa 500 mg b.i.d. 8.  Dulcolax 5 mg daily p.r.n. 9.  Dexilant 60 mg daily. 10.  Probiotic 1 capsule daily.  12.  Magnesium gluconate 1 tab daily. 13.  Promethazine 25 mg p.r.n. 14.  Nitroglycerin 0.4 mg p.r.n.   PROCEDURES: Percutaneous coronary intervention with coronary stent 05/23/2013.   HISTORY OF PRESENT ILLNESS: Please see admission H and [place] COURSE: The patient underwent elective percutaneous coronary intervention on 05/23/2013. The patient received a 2.75 x 12 mm Xience EX coronary stent in the ostium of a protected left main coronary artery. The patient tolerated the procedure well. Post procedural CPK-MB were 41 and 0.9 respectively. Following the procedure, the patient had an uneventful course in the CCU without recurrent chest pain. On the morning of 01/16/ 2015, the patient was ambulating without difficulty and was discharged home. She is scheduled to see me in follow-up in one week.    ____________________________ Isaias Cowman, MD ap:dp D: 05/24/2013 08:40:07 ET T: 05/24/2013 09:04:59 ET JOB#: [id number]  cc: Isaias Cowman, MD, <Dictator> Isaias Cowman MD ELECTRONICALLY SIGNED 06/25/2013 12:27

## 2014-11-04 ENCOUNTER — Encounter (INDEPENDENT_AMBULATORY_CARE_PROVIDER_SITE_OTHER): Payer: Self-pay

## 2014-11-04 ENCOUNTER — Ambulatory Visit (INDEPENDENT_AMBULATORY_CARE_PROVIDER_SITE_OTHER): Payer: Medicare Other | Admitting: Urology

## 2014-11-04 ENCOUNTER — Encounter: Payer: Self-pay | Admitting: Urology

## 2014-11-04 ENCOUNTER — Telehealth: Payer: Self-pay | Admitting: Urology

## 2014-11-04 VITALS — BP 129/72 | HR 74 | Ht 60.0 in | Wt 102.1 lb

## 2014-11-04 DIAGNOSIS — K279 Peptic ulcer, site unspecified, unspecified as acute or chronic, without hemorrhage or perforation: Secondary | ICD-10-CM | POA: Insufficient documentation

## 2014-11-04 DIAGNOSIS — N952 Postmenopausal atrophic vaginitis: Secondary | ICD-10-CM | POA: Diagnosis not present

## 2014-11-04 DIAGNOSIS — T7840XA Allergy, unspecified, initial encounter: Secondary | ICD-10-CM | POA: Insufficient documentation

## 2014-11-04 DIAGNOSIS — I341 Nonrheumatic mitral (valve) prolapse: Secondary | ICD-10-CM | POA: Insufficient documentation

## 2014-11-04 DIAGNOSIS — R339 Retention of urine, unspecified: Secondary | ICD-10-CM | POA: Diagnosis not present

## 2014-11-04 DIAGNOSIS — R310 Gross hematuria: Secondary | ICD-10-CM | POA: Diagnosis not present

## 2014-11-04 DIAGNOSIS — Z889 Allergy status to unspecified drugs, medicaments and biological substances status: Secondary | ICD-10-CM | POA: Insufficient documentation

## 2014-11-04 LAB — MICROSCOPIC EXAMINATION: Bacteria, UA: NONE SEEN

## 2014-11-04 LAB — URINALYSIS, COMPLETE
BILIRUBIN UA: NEGATIVE
Glucose, UA: NEGATIVE
Ketones, UA: NEGATIVE
Leukocytes, UA: NEGATIVE
NITRITE UA: NEGATIVE
PH UA: 5.5 (ref 5.0–7.5)
Protein, UA: NEGATIVE
RBC, UA: NEGATIVE
Specific Gravity, UA: 1.025 (ref 1.005–1.030)
UUROB: 0.2 mg/dL (ref 0.2–1.0)

## 2014-11-04 LAB — BLADDER SCAN AMB NON-IMAGING: SCAN RESULT: 0

## 2014-11-04 NOTE — Telephone Encounter (Signed)
Please call in the compounded estrogen for the patient to [facility].

## 2014-11-04 NOTE — Progress Notes (Signed)
11/04/2014 11:20 AM   Ellene Route 24-Dec-1945 [id number]  Referring provider: Murlean Iba, MD [address] [place], [place] [address]  Chief Complaint  Patient presents with  . Hematuria    referred by Dr. Candiss Norse nephrologist for hematuria     HPI: [name] is a 69 year old white female who is referred to [name] for microscopic hematuria.  Patient states she had microscopic hematuria on two occasions in the last 6 months.  She has not experienced any gross hematuria.  She has seen blood on the toilet paper after she wiped two months ago.  She has never smoked, she does not have a h/o recurrent UTI's, she does not have a history of stones, but she has been diagnosed with gout.  She has not experienced any flank pain, but she does experience suprapubic pain off and on during the day for the last several months.  She has a FM Hx of stones with her father and brothers having stones.    One month ago, she developed a red and swollen perineum.  She described it as an "orangutan's butt."  Her husband was prescribed a cream for balanitis.  She used the cream as well and the swelling in her perineum abated.    PMH: Past Medical History  Diagnosis Date  . Acid reflux   . CAD (coronary artery disease)   . Chronic kidney disease     Surgical History: Past Surgical History  Procedure Laterality Date  . Abdominal hysterectomy    . Tendon repair      right elbow  . Hand surgery  1998  . Trigger finger release    . Cholecystectomy    . Sigmoid resection / rectopexy  2006  . Cardiac surgery  2007    cardiac stent place  . Coronary artery bypass graft      triple  . Cataract extraction  2012  . Skin cancer excision  2013  . Coronary angioplasty with stent placement  2013    Home Medications:    Medication List       This list is accurate as of: 11/04/14 11:20 AM.  Always use your most recent med list.               aspirin 325 MG tablet  Take 325 mg by  mouth.     clopidogrel 75 MG tablet  Commonly known as:  PLAVIX  TAKE 1 TABLET BY MOUTH EVERY DAY     [employer] STOOL SOFTENER 50 MG capsule  Generic drug:  docusate sodium  Take 50 mg by mouth.     DEXILANT 60 MG capsule  Generic drug:  dexlansoprazole  Take 60 mg by mouth.     DULCOLAX 5 MG EC tablet  Generic drug:  bisacodyl  Take 40 mg by mouth.     furosemide 40 MG tablet  Commonly known as:  LASIX  Take 40 mg by mouth.     hyoscyamine 0.125 MG SL tablet  Commonly known as:  LEVSIN SL  0.125 mg.     isosorbide mononitrate 60 MG 24 hr tablet  Commonly known as:  IMDUR  TAKE 1 TABLET BY MOUTH EVERY MORNING     Magnesium Oxide 500 MG Tabs  Take 400 mg by mouth.     metoprolol succinate 25 MG 24 hr tablet  Commonly known as:  TOPROL-XL  Take 25 mg by mouth.     nitroGLYCERIN 0.4 MG SL tablet  Commonly  known as:  NITROSTAT  Place under the tongue.     potassium chloride 10 MEQ CR capsule  Commonly known as:  MICRO-K  Take by mouth.     promethazine 25 MG tablet  Commonly known as:  PHENERGAN  Take 25 mg by mouth.     riboflavin 100 MG Tabs tablet  Commonly known as:  VITAMIN B-2  Take 100 mg by mouth.     senna 8.6 MG tablet  Commonly known as:  SENOKOT  Take by mouth.     SUPER PROBIOTIC Caps  Take by mouth.     TYLENOL 500 MG tablet  Generic drug:  acetaminophen  Take 500 mg by mouth.     Vitamin D (Ergocalciferol) 50000 UNITS Caps capsule  Commonly known as:  DRISDOL  Take by mouth.        Allergies:  Allergies  Allergen Reactions  . Ezetimibe     Other reaction(s): Muscle Pain  . Statins     Other reaction(s): Muscle Pain  . Cephalexin Rash  . Metoclopramide Rash    Elevates BP, face draws to one side  . Penicillin G Rash  . Penicillins Rash  . Prochlorperazine Diarrhea and Nausea Only    GI Upset    Family History: Family History  Problem Relation Age of Onset  . Kidney Stones Father   . Kidney Stones Brother   . Prostate  cancer Father   . Heart disease Father   . Heart disease Mother     Social History:  reports that she has never smoked. She does not have any smokeless tobacco history on file. She reports that she does not use illicit drugs. Her alcohol history is not on file.  ROS: Urological Symptom Review  Patient is experiencing the following symptoms: Frequent urination Burning/pain with urination   Review of Systems  Gastrointestinal (upper)  : Nausea Vomiting Indigestion/heartburn  Gastrointestinal (lower) : Constipation  Constitutional : Negative for symptoms  Skin: Negative for skin symptoms  Eyes: Negative for eye symptoms  Ear/Nose/Throat : Negative for Ear/Nose/Throat symptoms  Hematologic/Lymphatic: Easy bruising  Cardiovascular : Leg swelling Chest pain  Respiratory : Shortness of breath  Endocrine: Negative for endocrine symptoms  Musculoskeletal: Negative for musculoskeletal symptoms  Neurological: Headaches  Psychologic: Negative for psychiatric symptoms   Physical Exam: BP 129/72 mmHg  Pulse 74  Ht 5' (1.524 m)  Wt 102 lb 1.6 oz (46.312 kg)  BMI 19.94 kg/m2  LMP 11/03/1980  Constitutional:  Alert and oriented, No acute distress. HEENT: [place] AT, moist mucus membranes.  Trachea midline, no masses. Cardiovascular: No clubbing, cyanosis, or edema. Respiratory: Normal respiratory effort, no increased work of breathing. GI: Abdomen is soft, nontender, nondistended, no abdominal masses GU: No CVA tenderness. Atrophic external genitalia.  Labia are fused together anterior ly.  Atrophic vaginal mucosa.  Perineum with satellite lesion around the anus. Skin: No rashes, bruises or suspicious lesions. Lymph: No cervical or inguinal adenopathy. Neurologic: Grossly intact, no focal deficits, moving all 4 extremities. Psychiatric: Normal mood and affect.  Laboratory Data: Results for orders placed or performed in visit on 11/04/14  Microscopic  Examination  Result Value Ref Range   WBC, UA 0-5 0 -  5 /hpf   RBC, UA 0-2 0 -  2 /hpf   Epithelial Cells (non renal) 0-10 0 - 10 /hpf   Casts Present (A) None seen /lpf   Cast Type Hyaline casts N/A   Crystals Present (A) N/A   Crystal Type  Calcium Oxalate N/A   Bacteria, UA None seen None seen/Few  Urinalysis, Complete  Result Value Ref Range   Specific Gravity, UA 1.025 1.005 - 1.030   pH, UA 5.5 5.0 - 7.5   Color, UA Yellow Yellow   Appearance Ur Clear Clear   Leukocytes, UA Negative Negative   Protein, UA Negative Negative/Trace   Glucose, UA Negative Negative   Ketones, UA Negative Negative   RBC, UA Negative Negative   Bilirubin, UA Negative Negative   Urobilinogen, Ur 0.2 0.2 - 1.0 mg/dL   Nitrite, UA Negative Negative   Microscopic Examination See below:   Bladder Scan (Post Void Residual) in office  Result Value Ref Range   Scan Result 0    Lab Results  Component Value Date   WBC 7.7 12/07/2006   HGB 8.4* 12/07/2006   HCT 24.9* 12/07/2006   MCV 87.8 12/07/2006   PLT 161 12/07/2006    Lab Results  Component Value Date   CREATININE 1.32* 05/24/2013    No results found for: PSA  No results found for: TESTOSTERONE  Lab Results  Component Value Date   HGBA1C  11/30/2006    5.5 (NOTE)   The ADA recommends the following therapeutic goals for glycemic   control related to Hgb A1C measurement:   Goal of Therapy:   < 7.0% Hgb A1C   Action Suggested:  > 8.0% Hgb A1C   Ref:  Diabetes Care, 22, Suppl. 1, 1999    Urinalysis    Component Value Date/Time   COLORURINE YELLOW 11/30/2006 1230   APPEARANCEUR CLOUDY* 11/30/2006 1230   LABSPEC 1.015 11/30/2006 1230   PHURINE 6.0 11/30/2006 1230   GLUCOSEU NEGATIVE 11/30/2006 1230   HGBUR NEGATIVE 11/30/2006 1230   BILIRUBINUR NEGATIVE 11/30/2006 1230   KETONESUR NEGATIVE 11/30/2006 1230   PROTEINUR NEGATIVE 11/30/2006 1230   UROBILINOGEN 0.2 11/30/2006 1230   NITRITE NEGATIVE 11/30/2006 1230   LEUKOCYTESUR  SMALL* 11/30/2006 1230    Pertinent Imaging: REASON FOR EXAM:    Hematuria CKD3 COMMENTS: PROCEDURE:     [name]  - US KIDNEY  - Apr 16 2014 10:05AM CLINICAL DATA:  Hematuria.  Chronic renal disease. EXAM: RENAL/URINARY TRACT ULTRASOUND COMPLETE COMPARISON:  CT 10/13/2008. FINDINGS: Right Kidney: Length: 8.6 cm. Mild renal cortical thinning. Echogenicity within normal limits. No mass or hydronephrosis visualized. Left Kidney: Length: 8.8 cm. Mild renal cortical thinning. Echogenicity within normal limits. No mass or hydronephrosis visualized. Bladder: Appears normal for degree of bladder distention. IMPRESSION: Mild renal cortical thinning, otherwise negative exam. No hydronephrosis. No bladder distention . Electronically Signed By: [place]:    1. Gross hematuria:  We cannot pursue a CT urogram due to patient's CKD. Her recent estimated glomerular filtration rate was 39 on 10/07/2014.  She had a normal renal ultrasound in September 2015. We'll obtain a noncontrast CT scan of the abdomen and pelvis. Pending these findings, we will pursue a cystoscopy with bilateral retrogrades.  - Urinalysis, Complete  2. Atrophic vaginitis:  Patient was given a sample of vaginal estrogen cream (Premarin) and instructed to apply 0.5mg  (pea-sized amount)  just inside the vaginal introitus with a finger-tip every night for two weeks and then Monday, Wednesday and Friday nights.  I explained to the patient that vaginally administered estrogen, which causes only a slight increase in the blood estrogen levels, have fewer contraindications and adverse systemic effects that oral HT.  We will reexamine her vaginal mucosa when she returns  for her CT report.  - Bladder Scan (Post Void Residual) in office   No Follow-up on file.  Zara Council, [facility] [address], [place], [place] [address] [phone]

## 2014-11-04 NOTE — Telephone Encounter (Signed)
Medication called into pharmacy. Pharmacy will call pt when medication is ready. Cw,lpn  

## 2014-11-04 NOTE — Telephone Encounter (Signed)
Please call Dr. Nolon Lennert Singh's office for the microscopic urinalysis performed in December 2015.

## 2014-11-07 ENCOUNTER — Telehealth: Payer: Self-pay

## 2014-11-07 DIAGNOSIS — N390 Urinary tract infection, site not specified: Secondary | ICD-10-CM

## 2014-11-07 LAB — CULTURE, URINE COMPREHENSIVE

## 2014-11-07 MED ORDER — SULFAMETHOXAZOLE-TRIMETHOPRIM 800-160 MG PO TABS
1.0000 | ORAL_TABLET | Freq: Two times a day (BID) | ORAL | Status: AC
Start: 2014-11-07 — End: 2014-11-14

## 2014-11-07 NOTE — Telephone Encounter (Signed)
Spoke with pt in reference to infection and needing a cath specimen after abt therapy. Pt voiced understanding. Pt is coming 11/17/14 @ 10:30am. Medication called into pt pharmacy. Cw,lpn

## 2014-11-07 NOTE — Telephone Encounter (Signed)
-----   Message from Nori Riis, PA-C sent at 11/06/2014 10:23 PM EDT ----- Patient has a +UCx.  They need to start Bactrim DS one tablet twice daily for seven days and then we need to check a cath specimen in 3 to 5 days after they complete their antibiotics.

## 2014-11-08 ENCOUNTER — Other Ambulatory Visit: Payer: Self-pay | Admitting: Urology

## 2014-11-11 ENCOUNTER — Telehealth: Payer: Self-pay

## 2014-11-11 ENCOUNTER — Ambulatory Visit
Admission: RE | Admit: 2014-11-11 | Discharge: 2014-11-11 | Disposition: A | Discharge status: Home / Self Care | Payer: Medicare Other | Source: Ambulatory Visit | Attending: Urology | Admitting: Urology

## 2014-11-11 DIAGNOSIS — R310 Gross hematuria: Secondary | ICD-10-CM

## 2014-11-11 DIAGNOSIS — Z9049 Acquired absence of other specified parts of digestive tract: Secondary | ICD-10-CM | POA: Diagnosis not present

## 2014-11-11 DIAGNOSIS — I700 Atherosclerosis of aorta: Secondary | ICD-10-CM | POA: Insufficient documentation

## 2014-11-11 DIAGNOSIS — Z90710 Acquired absence of both cervix and uterus: Secondary | ICD-10-CM | POA: Diagnosis not present

## 2014-11-11 NOTE — Telephone Encounter (Signed)
Spoke with pt in reference to results. Pt was transferred to the front to make an appt. Cw,lpn

## 2014-11-11 NOTE — Telephone Encounter (Signed)
-----   Message from Nori Riis, PA-C sent at 11/11/2014  4:07 PM EDT ----- Patient's non contrast CT did not identify any stones.  She will need a RUS and an appointment to discuss cystoscopy with retrogrades to complete her hematuria work up.

## 2014-11-17 ENCOUNTER — Ambulatory Visit: Payer: Medicare Other

## 2014-11-17 DIAGNOSIS — R319 Hematuria, unspecified: Principal | ICD-10-CM

## 2014-11-17 DIAGNOSIS — N390 Urinary tract infection, site not specified: Secondary | ICD-10-CM

## 2014-11-17 NOTE — Progress Notes (Signed)
In and Out Catheterization  Patient is present today for a I & O catheterization due to recent urinary tract infection. Patient was cleaned and prepped in a sterile fashion with betadine and Lidocaine 2% jelly was instilled into the urethra.  A 14 FR red rubber cath was inserted no complications were noted , 20 ml of urine return was noted, urine was yellow in color. A clean urine sample was collected for urine culture. Bladder was drained  and catheter was removed with out difficulty.    Preformed by: Bonnye Fava, CMA

## 2014-11-17 NOTE — Addendum Note (Signed)
Addended by: Wilson Singer on: 11/17/2014 11:16 AM   Modules accepted: Orders

## 2014-11-20 ENCOUNTER — Telehealth: Payer: Self-pay | Admitting: *Deleted

## 2014-11-20 LAB — CULTURE, URINE COMPREHENSIVE

## 2014-11-20 NOTE — Telephone Encounter (Signed)
Spoke with patient and gave results. Urine Culture had no growth.

## 2014-11-26 ENCOUNTER — Encounter: Payer: Self-pay | Admitting: Urology

## 2014-11-26 ENCOUNTER — Telehealth: Payer: Self-pay

## 2014-11-26 NOTE — Telephone Encounter (Signed)
Pt called wanting an oral antifungal for possible yeast. Per Larene Beach pt should use an otc monistat until next appt. Pt voiced understanding.

## 2014-12-01 ENCOUNTER — Encounter: Payer: Self-pay | Admitting: Urology

## 2014-12-01 ENCOUNTER — Ambulatory Visit (INDEPENDENT_AMBULATORY_CARE_PROVIDER_SITE_OTHER): Payer: Medicare Other | Admitting: Urology

## 2014-12-01 VITALS — BP 122/68 | HR 61 | Resp 18 | Ht 60.0 in | Wt 102.8 lb

## 2014-12-01 DIAGNOSIS — R310 Gross hematuria: Secondary | ICD-10-CM | POA: Diagnosis not present

## 2014-12-01 LAB — MICROSCOPIC EXAMINATION
Bacteria, UA: NONE SEEN
RBC, UA: NONE SEEN /hpf (ref 0–?)

## 2014-12-01 LAB — URINALYSIS, COMPLETE
BILIRUBIN UA: NEGATIVE
Glucose, UA: NEGATIVE
Leukocytes, UA: NEGATIVE
NITRITE UA: NEGATIVE
PH UA: 5.5 (ref 5.0–7.5)
RBC, UA: NEGATIVE
Specific Gravity, UA: 1.025 (ref 1.005–1.030)
Urobilinogen, Ur: 0.2 mg/dL (ref 0.2–1.0)

## 2014-12-01 NOTE — Progress Notes (Signed)
12/01/2014 11:24 AM   [name] Jul 17, 1945 [id number]  Referring provider: Tracie Harrier, MD [place], [place] [id number]  Chief Complaint  Patient presents with  . Hematuria    HPI: Patient presents today for review of her CT scan results and to discuss her symptoms after using her vaginal estrogen cream.    She feels she had subsequently developed a vaginal yeast infection since her last visit with [name]. She used over-the-counter Monistat suppositories over the weekend. The vaginal discharge that she was experiencing has abated.  The directions for the Monistat cream advised her not to use any other creams while using this medication, so she has not used her vaginal estrogen cream since Thursday, 10/28/2014.  She denies any gross hematuria or dysuria at this time.  She is still experiencing suprapubic pain and pressure. Her UA today was unremarkable.  Her PVR at her visit 2 weeks ago was 0 mL.  PMH: Past Medical History  Diagnosis Date  . Acid reflux   . CAD (coronary artery disease)   . Chronic kidney disease     Surgical History: Past Surgical History  Procedure Laterality Date  . Abdominal hysterectomy    . Tendon repair      right elbow  . Hand surgery  1998  . Trigger finger release    . Cholecystectomy    . Sigmoid resection / rectopexy  2006  . Cardiac surgery  2007    cardiac stent place  . Coronary artery bypass graft      triple  . Cataract extraction  2012  . Skin cancer excision  2013  . Coronary angioplasty with stent placement  2013    Home Medications:    Medication List       This list is accurate as of: 12/01/14 11:24 AM.  Always use your most recent med list.               aspirin 325 MG tablet  Take 325 mg by mouth.     clopidogrel 75 MG tablet  Commonly known as:  PLAVIX  TAKE 1 TABLET BY MOUTH EVERY DAY     [employer] STOOL SOFTENER 50 MG capsule  Generic drug:  docusate sodium  Take 50 mg by mouth.     DEXILANT 60 MG capsule  Generic drug:  dexlansoprazole  Take 60 mg by mouth.     DULCOLAX 5 MG EC tablet  Generic drug:  bisacodyl  Take 40 mg by mouth.     furosemide 40 MG tablet  Commonly known as:  LASIX  Take 40 mg by mouth.     hyoscyamine 0.125 MG SL tablet  Commonly known as:  LEVSIN SL  0.125 mg.     isosorbide mononitrate 60 MG 24 hr tablet  Commonly known as:  IMDUR  TAKE 1 TABLET BY MOUTH EVERY MORNING     MAGNESIUM CITRATE PO  Take 200 mg by mouth daily.     metoprolol succinate 25 MG 24 hr tablet  Commonly known as:  TOPROL-XL  Take 25 mg by mouth.     nitroGLYCERIN 0.4 MG SL tablet  Commonly known as:  NITROSTAT  Place under the tongue.     potassium chloride 10 MEQ CR capsule  Commonly known as:  MICRO-K  Take by mouth.     promethazine 25 MG tablet  Commonly known as:  PHENERGAN  Take 25 mg by mouth.     riboflavin 100 MG Tabs tablet  Commonly known as:  VITAMIN B-2  Take 100 mg by mouth.     senna 8.6 MG tablet  Commonly known as:  SENOKOT  Take by mouth.     SUPER PROBIOTIC Caps  Take by mouth.     TYLENOL 500 MG tablet  Generic drug:  acetaminophen  Take 500 mg by mouth.     Vitamin D (Ergocalciferol) 50000 UNITS Caps capsule  Commonly known as:  DRISDOL  Take by mouth.        Allergies:  Allergies  Allergen Reactions  . Ezetimibe     Other reaction(s): Muscle Pain  . Statins     Other reaction(s): Muscle Pain  . Cephalexin Rash  . Metoclopramide Rash    Elevates BP, face draws to one side  . Penicillin G Rash  . Penicillins Rash  . Prochlorperazine Diarrhea and Nausea Only    GI Upset    Family History: Family History  Problem Relation Age of Onset  . Kidney Stones Father   . Kidney Stones Brother   . Prostate cancer Father   . Heart disease Father   . Heart disease Mother     Social History:  reports that she has never smoked. She does not have any smokeless tobacco history on file. She reports that she does  not use illicit drugs. Her alcohol history is not on file.  ROS: UROLOGY Frequent Urination?: No Hard to postpone urination?: No Burning/pain with urination?: No Get up at night to urinate?: No Leakage of urine?: No Urine stream starts and stops?: No Trouble starting stream?: No Do you have to strain to urinate?: No Blood in urine?: No Urinary tract infection?: No Sexually transmitted disease?: No Injury to kidneys or bladder?: No Painful intercourse?: No Weak stream?: No Currently pregnant?: No Vaginal bleeding?: No Last menstrual period?: n  Gastrointestinal Nausea?: No Vomiting?: No Indigestion/heartburn?: No Diarrhea?: No Constipation?: No  Constitutional Fever: No Night sweats?: No Weight loss?: No Fatigue?: No  Skin Skin rash/lesions?: No Itching?: No  Eyes Blurred vision?: No Double vision?: No  Ears/Nose/Throat Sore throat?: No Sinus problems?: No  Hematologic/Lymphatic Swollen glands?: No Easy bruising?: No  Cardiovascular Leg swelling?: No Chest pain?: No  Respiratory Cough?: No Shortness of breath?: No  Endocrine Excessive thirst?: No  Musculoskeletal Back pain?: Yes Joint pain?: No  Neurological Headaches?: Yes Dizziness?: No  Psychologic Depression?: No Anxiety?: No  Physical Exam: BP 122/68 mmHg  Pulse 61  Resp 18  Ht 5' (1.524 m)  Wt 102 lb 12.8 oz (46.63 kg)  BMI 20.08 kg/m2  LMP 11/03/1980  GU:  Atrophic external genitalia.  Labia are still fused together anterior ly.  Normal urethral meatus. No urethral masses and/or tenderness. No bladder fullness or masses. No vaginal lesions or discharge. Normal rectal tone, no masses. Normal anus and perineum.  The rash with satellite lesions that was previously found in the perineum and anal region has abated.  Laboratory Data: Results for orders placed or performed in visit on 12/01/14  Microscopic Examination  Result Value Ref Range   WBC, UA 0-5 0 -  5 /hpf   RBC, UA  None seen 0 -  2 /hpf   Epithelial Cells (non renal) 0-10 0 - 10 /hpf   Casts Present (A) None seen /lpf   Cast Type Hyaline casts N/A   Bacteria, UA None seen None seen/Few   Urinalysis Comments Comment   Urinalysis, Complete  Result Value Ref Range   Specific Gravity, UA 1.025  1.005 - 1.030   pH, UA 5.5 5.0 - 7.5   Color, UA Yellow Yellow   Appearance Ur Clear Clear   Leukocytes, UA Negative Negative   Protein, UA 1+ (A) Negative/Trace   Glucose, UA Negative Negative   Ketones, UA Trace (A) Negative   RBC, UA Negative Negative   Bilirubin, UA Negative Negative   Urobilinogen, Ur 0.2 0.2 - 1.0 mg/dL   Nitrite, UA Negative Negative   Microscopic Examination See below:    Lab Results  Component Value Date   WBC 7.7 12/07/2006   HGB 8.4* 12/07/2006   HCT 24.9* 12/07/2006   MCV 87.8 12/07/2006   PLT 161 12/07/2006    Lab Results  Component Value Date   CREATININE 1.32* 05/24/2013    No results found for: PSA  No results found for: TESTOSTERONE  Lab Results  Component Value Date   HGBA1C  11/30/2006    5.5 (NOTE)   The ADA recommends the following therapeutic goals for glycemic   control related to Hgb A1C measurement:   Goal of Therapy:   < 7.0% Hgb A1C   Action Suggested:  > 8.0% Hgb A1C   Ref:  Diabetes Care, 22, Suppl. 1, 1999    Urinalysis    Component Value Date/Time   COLORURINE YELLOW 11/30/2006 1230   APPEARANCEUR CLOUDY* 11/30/2006 1230   LABSPEC 1.015 11/30/2006 1230   PHURINE 6.0 11/30/2006 1230   GLUCOSEU Negative 11/04/2014 1121   HGBUR NEGATIVE 11/30/2006 1230   BILIRUBINUR Negative 11/04/2014 1121   BILIRUBINUR NEGATIVE 11/30/2006 1230   KETONESUR NEGATIVE 11/30/2006 1230   PROTEINUR NEGATIVE 11/30/2006 1230   UROBILINOGEN 0.2 11/30/2006 1230   NITRITE Negative 11/04/2014 1121   NITRITE NEGATIVE 11/30/2006 1230   LEUKOCYTESUR Negative 11/04/2014 1121   LEUKOCYTESUR SMALL* 11/30/2006 1230    Pertinent Imaging: CLINICAL DATA: Pelvic  pain  EXAM: CT ABDOMEN AND PELVIS WITHOUT CONTRAST  TECHNIQUE: Multidetector CT imaging of the abdomen and pelvis was performed following the standard protocol without IV contrast.  COMPARISON: 10/13/2008  FINDINGS: Lower chest: No pleural or pericardial effusion noted.  Hepatobiliary: No suspicious liver abnormality. Previous cholecystectomy. No biliary dilatation.  Pancreas: Normal appearance of the pancreas.  Spleen: The spleen is negative.  Adrenals/Urinary Tract: The adrenal glands are both normal. The right kidney is normal. The left kidney is also normal. There is no hydronephrosis or hydroureter identified.  Stomach/Bowel: The stomach is normal. The small bowel loops have a normal course and caliber. No obstruction. Normal appearance of the proximal colon. Postoperative changes involving the distal colon and rectum noted.  Vascular/Lymphatic: Calcified atherosclerotic disease involves the abdominal aorta. No aneurysm. No enlarged retroperitoneal or mesenteric adenopathy. No enlarged pelvic or inguinal lymph nodes.  Reproductive: Previous hysterectomy. No adnexal mass.  Other: There is no ascites or focal fluid collections within the abdomen or pelvis. Surgical clips are identified within the pelvis.  Musculoskeletal: Review of the visualized bony structures is significant for lumbar spondylosis. There is an anterolisthesis of L5 on S1.  IMPRESSION: 1. No evidence for nephrolithiasis or hydronephrosis.  2. Previous hysterectomy. 3. Prior sigmoid colon resection with colorectal anastomosis. 4. Aortic atherosclerosis.   Electronically Signed  By: Kerby Moors M.D.  On: 11/11/2014 15:22   Assessment & Plan:   1. Gross hematuria:   CT did not demonstrate any GU pathology at this time, but it is limited due to the contrast not being used because of patient's CKD.  We will obtain a RUS and cystoscopy  with RTG's to complete the  hematuria work up.  I described to the patient how the procedure is performed and the risk associated with the procedure, such as: Infection, bleeding, uncomfortableness for the first few days after the procedure, the possibility of a biopsy of an area of concern in the ureters or bladder and the possibility of a stent placement.  I also explained the risks of general anesthesia, such as: MI, CVA, paralysis, coma and/or death.  - Urinalysis, Complete  2. Atrophic vaginitis: Patient will continue the vaginal estrogen cream (Premarin) and instructed to apply 0.5mg  (pea-sized amount) just inside the vaginal introitus with a finger-tip on Monday, Wednesday and Friday nights.  No Follow-up on file.  Zara Council, [facility] [address], [place], [place] [address] [phone]

## 2014-12-02 ENCOUNTER — Encounter: Payer: Self-pay | Admitting: Urology

## 2014-12-03 ENCOUNTER — Encounter: Payer: Self-pay | Admitting: Urology

## 2014-12-08 ENCOUNTER — Encounter: Payer: Self-pay | Admitting: Urology

## 2014-12-08 ENCOUNTER — Telehealth: Payer: Self-pay | Admitting: Urology

## 2014-12-08 NOTE — Telephone Encounter (Signed)
Sent the pt a message through My Chart.

## 2014-12-08 NOTE — Telephone Encounter (Signed)
I sent the pt a message through My Chart to remind her of stopping her Plavix and ASA.

## 2014-12-08 NOTE — Telephone Encounter (Signed)
Attempted to contact pt, no answer to notify her of cardiac clearance and the need to stop ASA & Plavix prior to procedure.

## 2014-12-09 ENCOUNTER — Telehealth: Payer: Self-pay

## 2014-12-17 DIAGNOSIS — R319 Hematuria, unspecified: Secondary | ICD-10-CM | POA: Insufficient documentation

## 2014-12-22 ENCOUNTER — Encounter
Admission: RE | Admit: 2014-12-22 | Discharge: 2014-12-22 | Disposition: A | Discharge status: Home / Self Care | Payer: Medicare Other | Source: Ambulatory Visit | Attending: Urology | Admitting: Urology

## 2014-12-22 DIAGNOSIS — Z01810 Encounter for preprocedural cardiovascular examination: Secondary | ICD-10-CM | POA: Diagnosis present

## 2014-12-22 HISTORY — DX: Irritable bowel syndrome without diarrhea: K58.9

## 2014-12-22 NOTE — Patient Instructions (Addendum)
  Your procedure is scheduled on: Monday December 29, 2014. Report to Same Day Surgery. To find out your arrival time please call [phone] between 1PM - 3PM on  Friday August 19,2016 .  Remember: Instructions that are not followed completely may result in serious medical risk, up to and including death, or upon the discretion of your surgeon and anesthesiologist your surgery may need to be rescheduled.    _x___ 1. Do not eat food or drink liquids after midnight. No gum chewing or hard candies.     ____ 2. No Alcohol for 24 hours before or after surgery.   ____ 3. Bring all medications with you on the day of surgery if instructed.    _x___ 4. Notify your doctor if there is any change in your medical condition     (cold, fever, infections).     Do not wear jewelry, make-up, hairpins, clips or nail polish.  Do not wear lotions, powders, or perfumes. You may wear deodorant.  Do not shave 48 hours prior to surgery. Men may shave face and neck.  Do not bring valuables to the hospital.    [facility] is not responsible for any belongings or valuables.               Contacts, dentures or bridgework may not be worn into surgery.  Leave your suitcase in the car. After surgery it may be brought to your room.  For patients admitted to the hospital, discharge time is determined by your treatment team.   Patients discharged the day of surgery will not be allowed to drive home.    Please read over the following fact sheets that you were given:   [facility] Preparing for Surgery  _x__ Take these medicines the morning of surgery with A SIP OF WATER:    1. isosorbide mononitrate (IMDUR)  2. dexlansoprazole (DEXILANT)    ____ Fleet Enema (as directed)   ____ Use CHG Soap as directed  ____ Use inhalers on the day of surgery  ____ Stop metformin 2 days prior to surgery    ____ Take 1/2 of usual insulin dose the night before surgery and none on the morning of surgery.   __x__ Stop Plavix  & aspirin on Wednesday December 24, 2014.  ____ Stop Anti-inflammatories on Does not apply. Tylenol OK to take for pain.   ____ Stop supplements until after surgery.    ____ Bring C-Pap to the hospital.

## 2014-12-22 NOTE — Pre-Procedure Instructions (Addendum)
Spoke with [name] at Dr. Guinevere Ferrari office regarding recent lab work done at Dr. Linton Ham office, CBC with diff and met B.  OK to use those labs,  Do need to do urine culture.

## 2014-12-24 LAB — URINE CULTURE: Culture: 20000

## 2014-12-29 ENCOUNTER — Ambulatory Visit
Admission: RE | Admit: 2014-12-29 | Discharge: 2014-12-29 | Disposition: A | Discharge status: Home / Self Care | Payer: Medicare Other | Source: Ambulatory Visit | Attending: Urology | Admitting: Urology

## 2014-12-29 ENCOUNTER — Encounter: Payer: Self-pay | Admitting: *Deleted

## 2014-12-29 ENCOUNTER — Telehealth: Payer: Self-pay

## 2014-12-29 ENCOUNTER — Ambulatory Visit: Payer: Medicare Other | Admitting: Anesthesiology

## 2014-12-29 ENCOUNTER — Encounter
Admission: RE | Disposition: A | Discharge status: Home / Self Care | Payer: Self-pay | Source: Ambulatory Visit | Attending: Urology

## 2014-12-29 DIAGNOSIS — K219 Gastro-esophageal reflux disease without esophagitis: Secondary | ICD-10-CM | POA: Diagnosis not present

## 2014-12-29 DIAGNOSIS — N3289 Other specified disorders of bladder: Secondary | ICD-10-CM | POA: Insufficient documentation

## 2014-12-29 DIAGNOSIS — Z888 Allergy status to other drugs, medicaments and biological substances status: Secondary | ICD-10-CM | POA: Insufficient documentation

## 2014-12-29 DIAGNOSIS — Z9049 Acquired absence of other specified parts of digestive tract: Secondary | ICD-10-CM | POA: Insufficient documentation

## 2014-12-29 DIAGNOSIS — Z881 Allergy status to other antibiotic agents status: Secondary | ICD-10-CM | POA: Insufficient documentation

## 2014-12-29 DIAGNOSIS — R310 Gross hematuria: Secondary | ICD-10-CM | POA: Diagnosis not present

## 2014-12-29 DIAGNOSIS — Z955 Presence of coronary angioplasty implant and graft: Secondary | ICD-10-CM | POA: Diagnosis not present

## 2014-12-29 DIAGNOSIS — Z8249 Family history of ischemic heart disease and other diseases of the circulatory system: Secondary | ICD-10-CM | POA: Insufficient documentation

## 2014-12-29 DIAGNOSIS — N3081 Other cystitis with hematuria: Secondary | ICD-10-CM | POA: Insufficient documentation

## 2014-12-29 DIAGNOSIS — Z90710 Acquired absence of both cervix and uterus: Secondary | ICD-10-CM | POA: Diagnosis not present

## 2014-12-29 DIAGNOSIS — Z951 Presence of aortocoronary bypass graft: Secondary | ICD-10-CM | POA: Diagnosis not present

## 2014-12-29 DIAGNOSIS — Z841 Family history of disorders of kidney and ureter: Secondary | ICD-10-CM | POA: Diagnosis not present

## 2014-12-29 DIAGNOSIS — K589 Irritable bowel syndrome without diarrhea: Secondary | ICD-10-CM | POA: Diagnosis not present

## 2014-12-29 DIAGNOSIS — Z8042 Family history of malignant neoplasm of prostate: Secondary | ICD-10-CM | POA: Insufficient documentation

## 2014-12-29 DIAGNOSIS — Z85828 Personal history of other malignant neoplasm of skin: Secondary | ICD-10-CM | POA: Diagnosis not present

## 2014-12-29 DIAGNOSIS — I2510 Atherosclerotic heart disease of native coronary artery without angina pectoris: Secondary | ICD-10-CM | POA: Diagnosis not present

## 2014-12-29 DIAGNOSIS — Z9889 Other specified postprocedural states: Secondary | ICD-10-CM | POA: Diagnosis not present

## 2014-12-29 DIAGNOSIS — Z9849 Cataract extraction status, unspecified eye: Secondary | ICD-10-CM | POA: Diagnosis not present

## 2014-12-29 DIAGNOSIS — N183 Chronic kidney disease, stage 3 (moderate): Secondary | ICD-10-CM | POA: Diagnosis not present

## 2014-12-29 DIAGNOSIS — Z880 Allergy status to penicillin: Secondary | ICD-10-CM | POA: Diagnosis not present

## 2014-12-29 DIAGNOSIS — Z91048 Other nonmedicinal substance allergy status: Secondary | ICD-10-CM | POA: Diagnosis not present

## 2014-12-29 DIAGNOSIS — N390 Urinary tract infection, site not specified: Secondary | ICD-10-CM

## 2014-12-29 DIAGNOSIS — N3080 Other cystitis without hematuria: Secondary | ICD-10-CM

## 2014-12-29 HISTORY — PX: CYSTOSCOPY WITH BIOPSY: SHX5122

## 2014-12-29 HISTORY — PX: CYSTOSCOPY W/ RETROGRADES: SHX1426

## 2014-12-29 SURGERY — CYSTOSCOPY, WITH RETROGRADE PYELOGRAM
Anesthesia: General | Wound class: Clean Contaminated

## 2014-12-29 MED ORDER — DEXAMETHASONE SODIUM PHOSPHATE 4 MG/ML IJ SOLN
INTRAMUSCULAR | Status: DC | PRN
  Administered 2014-12-29: 10 mg via INTRAVENOUS

## 2014-12-29 MED ORDER — SODIUM CHLORIDE 0.9 % IV SOLN
INTRAVENOUS | Status: DC
  Administered 2014-12-29 (×2): via INTRAVENOUS

## 2014-12-29 MED ORDER — FENTANYL CITRATE (PF) 100 MCG/2ML IJ SOLN
INTRAMUSCULAR | Status: DC | PRN
  Administered 2014-12-29: 50 ug via INTRAVENOUS

## 2014-12-29 MED ORDER — ONDANSETRON HCL 4 MG/2ML IJ SOLN
4.0000 mg | Freq: Once | INTRAMUSCULAR | Status: AC | PRN
  Administered 2014-12-29: 4 mg via INTRAVENOUS

## 2014-12-29 MED ORDER — CIPROFLOXACIN IN D5W 400 MG/200ML IV SOLN: 400.0000 mg | Freq: Two times a day (BID) | INTRAVENOUS | Status: DC

## 2014-12-29 MED ORDER — PROPOFOL 10 MG/ML IV BOLUS
INTRAVENOUS | Status: DC | PRN
  Administered 2014-12-29: 170 mg via INTRAVENOUS

## 2014-12-29 MED ORDER — IOTHALAMATE MEGLUMINE 43 % IV SOLN
INTRAVENOUS | Status: DC | PRN
  Administered 2014-12-29: 15 mL

## 2014-12-29 MED ORDER — FENTANYL CITRATE (PF) 100 MCG/2ML IJ SOLN
INTRAMUSCULAR | Status: AC
  Filled 2014-12-29: qty 2

## 2014-12-29 MED ORDER — CIPROFLOXACIN IN D5W 400 MG/200ML IV SOLN
INTRAVENOUS | Status: AC
  Administered 2014-12-29: 400 mg via INTRAVENOUS
  Filled 2014-12-29: qty 200

## 2014-12-29 MED ORDER — OXYCODONE-ACETAMINOPHEN 5-325 MG PO TABS: 1.0000 | ORAL_TABLET | Freq: Four times a day (QID) | ORAL | Status: DC | PRN

## 2014-12-29 MED ORDER — SULFAMETHOXAZOLE-TRIMETHOPRIM 800-160 MG PO TABS: 1.0000 | ORAL_TABLET | Freq: Two times a day (BID) | ORAL | Status: AC

## 2014-12-29 MED ORDER — BELLADONNA ALKALOIDS-OPIUM 16.2-60 MG RE SUPP
RECTAL | Status: DC | PRN
  Administered 2014-12-29: 1 via RECTAL

## 2014-12-29 MED ORDER — ONDANSETRON HCL 4 MG/2ML IJ SOLN
INTRAMUSCULAR | Status: AC
  Administered 2014-12-29: 4 mg via INTRAVENOUS
  Filled 2014-12-29: qty 2

## 2014-12-29 MED ORDER — BELLADONNA ALKALOIDS-OPIUM 16.2-60 MG RE SUPP
RECTAL | Status: AC
  Filled 2014-12-29: qty 1

## 2014-12-29 MED ORDER — FENTANYL CITRATE (PF) 100 MCG/2ML IJ SOLN
25.0000 ug | INTRAMUSCULAR | Status: DC | PRN
  Administered 2014-12-29 (×4): 25 ug via INTRAVENOUS

## 2014-12-29 MED ORDER — BUPIVACAINE HCL 0.5 % IJ SOLN
INTRAMUSCULAR | Status: DC | PRN
  Administered 2014-12-29: 30 mL

## 2014-12-29 MED ORDER — ONDANSETRON HCL 4 MG/2ML IJ SOLN
INTRAMUSCULAR | Status: DC | PRN
  Administered 2014-12-29: 4 mg via INTRAVENOUS

## 2014-12-29 MED ORDER — BUPIVACAINE HCL (PF) 0.5 % IJ SOLN
INTRAMUSCULAR | Status: AC
  Filled 2014-12-29: qty 30

## 2014-12-29 MED ORDER — STERILE WATER FOR IRRIGATION IR SOLN
Status: DC | PRN
  Administered 2014-12-29: 300 mL

## 2014-12-29 MED ORDER — EPHEDRINE SULFATE 50 MG/ML IJ SOLN
INTRAMUSCULAR | Status: DC | PRN
  Administered 2014-12-29 (×2): 5 mg via INTRAVENOUS

## 2014-12-29 MED ORDER — MIDAZOLAM HCL 2 MG/2ML IJ SOLN
INTRAMUSCULAR | Status: DC | PRN
Start: 2014-12-29 — End: 2014-12-29
  Administered 2014-12-29: 2 mg via INTRAVENOUS

## 2014-12-29 SURGICAL SUPPLY — 32 items
BAG DRAIN CYSTO-URO LG1000N (MISCELLANEOUS) ×3 IMPLANT
BAG URO DRAIN 2000ML W/SPOUT (MISCELLANEOUS) ×3 IMPLANT
CATH FOL LEG HOLDER (MISCELLANEOUS) ×3 IMPLANT
CATH FOLEY 2WAY  5CC 16FR (CATHETERS)
CATH URETL 5X70 OPEN END (CATHETERS) ×3 IMPLANT
CATH URTH 16FR FL 2W BLN LF (CATHETERS) IMPLANT
CONRAY 43 FOR UROLOGY 50M (MISCELLANEOUS) ×3 IMPLANT
DRESSING TELFA 4X3 1S ST N-ADH (GAUZE/BANDAGES/DRESSINGS) ×3 IMPLANT
ELECT URO BUGBEE 5F (MISCELLANEOUS) ×3
ELECTRODE URO BUGBEE 5F (MISCELLANEOUS) ×2 IMPLANT
GLOVE BIO SURGEON STRL SZ7 (GLOVE) ×6 IMPLANT
GLOVE BIO SURGEON STRL SZ7.5 (GLOVE) ×3 IMPLANT
GOWN STRL REUS W/ TWL LRG LVL3 (GOWN DISPOSABLE) ×2 IMPLANT
GOWN STRL REUS W/ TWL XL LVL3 (GOWN DISPOSABLE) ×2 IMPLANT
GOWN STRL REUS W/TWL LRG LVL3 (GOWN DISPOSABLE) ×1
GOWN STRL REUS W/TWL XL LVL3 (GOWN DISPOSABLE) ×1
GUIDEWIRE STR ZIPWIRE 035X150 (MISCELLANEOUS) IMPLANT
JELLY LUB 2OZ STRL (MISCELLANEOUS) ×1
JELLY LUBE 2OZ STRL (MISCELLANEOUS) ×2 IMPLANT
KIT RM TURNOVER CYSTO AR (KITS) ×3 IMPLANT
PACK CYSTO AR (MISCELLANEOUS) ×3 IMPLANT
PAD GROUND ADULT SPLIT (MISCELLANEOUS) IMPLANT
PREP PVP WINGED SPONGE (MISCELLANEOUS) ×3 IMPLANT
SET CYSTO W/LG BORE CLAMP LF (SET/KITS/TRAYS/PACK) ×3 IMPLANT
SET IRRIG Y TYPE TUR BLADDER L (SET/KITS/TRAYS/PACK) ×3 IMPLANT
SOL .9 NS 3000ML IRR  AL (IV SOLUTION) ×1
SOL .9 NS 3000ML IRR UROMATIC (IV SOLUTION) ×2 IMPLANT
SOL PREP PVP 2OZ (MISCELLANEOUS) ×3
SOLUTION PREP PVP 2OZ (MISCELLANEOUS) ×2 IMPLANT
SYRINGE IRR TOOMEY STRL 70CC (SYRINGE) ×3 IMPLANT
WATER STERILE IRR 1000ML POUR (IV SOLUTION) ×3 IMPLANT
WATER STERILE IRR 3000ML UROMA (IV SOLUTION) ×3 IMPLANT

## 2014-12-29 NOTE — Discharge Instructions (Addendum)
General Anesthesia °General anesthesia is a sleep-like state of non-feeling produced by medicines (anesthetics). General anesthesia prevents you from being alert and feeling pain during a medical procedure. Your caregiver may recommend general anesthesia if your procedure: °· Is long. °· Is painful or uncomfortable. °· Would be frightening to see or hear. °· Requires you to be still. °· Affects your breathing. °· Causes significant blood loss. °LET YOUR CAREGIVER KNOW ABOUT: °· Allergies to food or medicine. °· Medicines taken, including vitamins, herbs, eyedrops, over-the-counter medicines, and creams. °· Use of steroids (by mouth or creams). °· Previous problems with anesthetics or numbing medicines, including problems experienced by relatives. °· History of bleeding problems or blood clots. °· Previous surgeries and types of anesthetics received. °· Possibility of pregnancy, if this applies. °· Use of cigarettes, alcohol, or illegal drugs. °· Any health condition(s), especially diabetes, sleep apnea, and high blood pressure. °RISKS AND COMPLICATIONS °General anesthesia rarely causes complications. However, if complications do occur, they can be life threatening. Complications include: °· A lung infection. °· A stroke. °· A heart attack. °· Waking up during the procedure. When this occurs, the patient may be unable to move and communicate that he or she is awake. The patient may feel severe pain. °Older adults and adults with serious medical problems are more likely to have complications than adults who are young and healthy. Some complications can be prevented by answering all of your caregiver's questions thoroughly and by following all pre-procedure instructions. It is important to tell your caregiver if any of the pre-procedure instructions, especially those related to diet, were not followed. Any food or liquid in the stomach can cause problems when you are under general anesthesia. °BEFORE THE  PROCEDURE °· Ask your caregiver if you will have to spend the night at the hospital. If you will not have to spend the night, arrange to have an adult drive you and stay with you for 24 hours. °· Follow your caregiver's instructions if you are taking dietary supplements or medicines. Your caregiver may tell you to stop taking them or to reduce your dosage. °· Do not smoke for as long as possible before your procedure. If possible, stop smoking 3-6 weeks before the procedure. °· Do not take new dietary supplements or medicines within 1 week of your procedure unless your caregiver approves them. °· Do not eat within 8 hours of your procedure or as directed by your caregiver. Drink only clear liquids, such as water, black coffee (without milk or cream), and fruit juices (without pulp). °· Do not drink within 3 hours of your procedure or as directed by your caregiver. °· You may brush your teeth on the morning of the procedure, but make sure to spit out the toothpaste and water when finished. °PROCEDURE  °You will receive anesthetics through a mask, through an intravenous (IV) access tube, or through both. A doctor who specializes in anesthesia (anesthesiologist) or a nurse who specializes in anesthesia (nurse anesthetist) or both will stay with you throughout the procedure to make sure you remain unconscious. He or she will also watch your blood pressure, pulse, and oxygen levels to make sure that the anesthetics do not cause any problems. Once you are asleep, a breathing tube or mask may be used to help you breathe. °AFTER THE PROCEDURE °You will wake up after the procedure is complete. You may be in the room where the procedure was performed or in a recovery area. You may have a sore throat if   a breathing tube was used. You may also feel:  Dizzy.  Weak.  Drowsy.  Confused.  Nauseous.  Cold. These are all normal responses and can be expected to last for up to 24 hours after the procedure is complete. A  caregiver will tell you when you are ready to go home. This will usually be when you are fully awake and in stable condition. Document Released: 08/02/2007 Document Revised: 09/09/2013 Document Reviewed: 08/24/2011 [facility] Patient Information 2015 [place], [place]. This information is not intended to replace advice given to you by your health care provider. Make sure you discuss any questions you have with your health care provider. Cystoscopy, Care After Refer to this sheet in the next few weeks. These instructions provide you with information on caring for yourself after your procedure. Your caregiver may also give you more specific instructions. Your treatment has been planned according to current medical practices, but problems sometimes occur. Call your caregiver if you have any problems or questions after your procedure. HOME CARE INSTRUCTIONS  Things you can do to ease any discomfort after your procedure include:  Drinking enough water and fluids to keep your urine clear or pale yellow.  Taking a warm bath to relieve any burning feelings. SEEK IMMEDIATE MEDICAL CARE IF:   You have an increase in blood in your urine.  You notice blood clots in your urine.  You have difficulty passing urine.  You have the chills.  You have abdominal pain.  You have a fever or persistent symptoms for more than 2-3 days.  You have a fever and your symptoms suddenly get worse. MAKE SURE YOU:   Understand these instructions.  Will watch your condition.  Will get help right away if you are not doing well or get worse. Document Released: 11/12/2004 Document Revised: 12/26/2012 Document Reviewed: 10/17/2011 [facility] Patient Information 2015 [place], [place]. This information is not intended to replace advice given to you by your health care provider. Make sure you discuss any questions you have with your health care provider.

## 2014-12-29 NOTE — Telephone Encounter (Signed)
-----   Message from Nori Riis, PA-C sent at 12/28/2014  9:50 PM EDT ----- Patient has a +UCx.  They need to start Bactrim one twice daily for seven days and then we need to check a clean catch specimen in 3 to 5 days after they complete their antibiotics.  She is having cystoscopy with RTG's on Monday.

## 2014-12-29 NOTE — Op Note (Signed)
Postoperative note  Preop diagnosis gross hematuria  Postoperative same with small cystic solid lesion in the trigone  Cystoscopy bilateral retrograde pyelogram with bladder biopsy and fulguration of possible tumor  Anesthesia Gen.  Estimated blood loss 0  Patient sterilely prepped and draped in the supine lithotomy position and after an appropriate timeout procedures (21 [place] Stortz cystoscope sheath with 30 lens was inserted through the urethra into the bladder and the trigone the small white cystic type lesion was seen appears solid and is biopsied with a Stortz rigid biopsy forceps. The base of this is fulgurated with a 5 [place] Bugbee electrode. Bilateral retrograde pyelograms were done utilizing a 5 French ureteral catheter. No tumors meniscus gross abnormalities are seen in either ureter and the architecture of both kidneys is normal. The end of the procedure patient's bladder is emptied and 30 mL a half percent plain Marcaine were placed in the bladder and a B&O suppository was placed in the rectum rectal exam is negative tumors mass screws of fixation of the pelvis.

## 2014-12-29 NOTE — OR Nursing (Signed)
Pt still slightly nauseated but wanting to go home.

## 2014-12-29 NOTE — Anesthesia Preprocedure Evaluation (Signed)
Anesthesia Evaluation  Patient identified by MRN, date of birth, ID band Patient awake    Reviewed: Allergy & Precautions, H&P , NPO status , Patient's Chart, lab work & pertinent test results, reviewed documented beta blocker date and time   History of Anesthesia Complications Negative for: history of anesthetic complications  Airway Mallampati: II  TM Distance: >3 FB Neck ROM: full    Dental no notable dental hx. (+) Partial Upper, Poor Dentition   Pulmonary neg pulmonary ROS,  breath sounds clear to auscultation  Pulmonary exam normal       Cardiovascular Exercise Tolerance: Good hypertension, On Home Beta Blockers - angina+ CAD, + Past MI, + Cardiac Stents (Last stent in January of 2015, 3 totals stents) and + CABG Normal cardiovascular exam- dysrhythmias + Valvular Problems/Murmurs MVP Rhythm:regular Rate:Normal     Neuro/Psych negative neurological ROS  negative psych ROS   GI/Hepatic Neg liver ROS, PUD, GERD-  Medicated,  Endo/Other  negative endocrine ROS  Renal/GU CRFRenal disease  negative genitourinary   Musculoskeletal   Abdominal   Peds  Hematology negative hematology ROS (+)   Anesthesia Other Findings Past Medical History:   Acid reflux                                                  CAD (coronary artery disease)                                Chronic kidney disease                                         Comment:stage 3 chronic kidney disease   IBS (irritable bowel syndrome)                  2004         Reproductive/Obstetrics negative OB ROS                             Anesthesia Physical Anesthesia Plan  ASA: III  Anesthesia Plan: General   Post-op Pain Management:    Induction:   Airway Management Planned:   Additional Equipment:   Intra-op Plan:   Post-operative Plan:   Informed Consent: I have reviewed the patients History and Physical, chart, labs  and discussed the procedure including the risks, benefits and alternatives for the proposed anesthesia with the patient or authorized representative who has indicated his/her understanding and acceptance.   Dental Advisory Given  Plan Discussed with: Anesthesiologist, CRNA and Surgeon  Anesthesia Plan Comments:         Anesthesia Quick Evaluation

## 2014-12-29 NOTE — Transfer of Care (Signed)
Immediate Anesthesia Transfer of Care Note  Patient: [name]  Procedure(s) Performed: Procedure(s): CYSTOSCOPY WITH RETROGRADE PYELOGRAM (Bilateral) CYSTOSCOPY WITH BIOPSY (N/A) CYSTOSCOPY WITH STENT PLACEMENT (Bilateral)  Patient Location: PACU  Anesthesia Type:General  Level of Consciousness: sedated and responds to stimulation  Airway & Oxygen Therapy: Patient Spontanous Breathing and Patient connected to nasal cannula oxygen  Post-op Assessment: Report given to RN and Post -op Vital signs reviewed and stable  Post vital signs: Reviewed and stable  Last Vitals:  Filed Vitals:   12/29/14 1407  BP: 119/64  Pulse: 86  Temp: 36.5 C  Resp: 23    Complications: No apparent anesthesia complications

## 2014-12-29 NOTE — Anesthesia Procedure Notes (Signed)
Procedure Name: LMA Insertion Date/Time: 12/29/2014 1:31 PM Performed by: Jonna Clark Pre-anesthesia Checklist: Patient identified, Patient being monitored, Timeout performed, Emergency Drugs available and Suction available Patient Re-evaluated:Patient Re-evaluated prior to inductionOxygen Delivery Method: Circle system utilized Preoxygenation: Pre-oxygenation with 100% oxygen Intubation Type: IV induction Ventilation: Mask ventilation without difficulty LMA: LMA inserted LMA Size: 3.0 Tube type: Oral Number of attempts: 1 Placement Confirmation: positive ETCO2 and breath sounds checked- equal and bilateral Tube secured with: Tape Dental Injury: Teeth and Oropharynx as per pre-operative assessment

## 2014-12-29 NOTE — H&P (Signed)
@ENCDATE @ 11:35 AM   [name] 02/23/1946 [id number]  Referring provider: No referring provider defined for this encounter.  No chief complaint on file.   [id number] hematuria without ability to image patient HPI   PMH: Past Medical History  Diagnosis Date  . Acid reflux   . CAD (coronary artery disease)   . Chronic kidney disease     stage 3 chronic kidney disease  . IBS (irritable bowel syndrome) 2004    Surgical History: Past Surgical History  Procedure Laterality Date  . Abdominal hysterectomy    . Tendon repair      right elbow  . Hand surgery  1998  . Trigger finger release    . Cholecystectomy    . Sigmoid resection / rectopexy  2006  . Cardiac surgery  2007    cardiac stent place  . Coronary artery bypass graft      triple  . Cataract extraction  2012  . Skin cancer excision  2013  . Coronary angioplasty with stent placement  2013    Home Medications:    Medication List    Notice    Cannot display discharge medications because the patient has not yet been admitted.      Allergies:  Allergies  Allergen Reactions  . Prochlorperazine Nausea And Vomiting    Severe vomiting  . Ezetimibe     Other reaction(s): Muscle Pain  . Statins     Other reaction(s): Muscle Pain  . Tape Other (See Comments)    Pulls my skin off when the tape is removed. Use paper tape only.  . Cephalexin Rash  . Metoclopramide Rash    Elevates BP, face draws to one side  . Penicillin G Rash  . Penicillins Rash    Family History: Family History  Problem Relation Age of Onset  . Kidney Stones Father   . Kidney Stones Brother   . Prostate cancer Father   . Heart disease Father   . Heart disease Mother     Social History:  reports that she has never smoked. She does not have any smokeless tobacco history on file. She reports that she does not drink alcohol or use illicit drugs.  ROS:  GU: no urgency frequency  Previous UTI    GI : No reflux, nausea  or vomiting   Cardiac: No chest pain or peripheral edema   Respiratory: No cyanosis, SOB or asthma   Skin:  No rashes itching   Lymphatic:  No lymph node swelling                       Physical Exam: LMP 11/03/1980  Constitutional:  Alert and oriented, No acute distress. HEENT: [place] AT, moist mucus membranes.  Trachea midline, no masses. Cardiovascular: No clubbing, cyanosis, or edema.HS RRR Respiratory: Normal respiratory effort, no increased work of breathing. GI: Abdomen is soft, nontender, nondistended, no abdominal masses GU: No CVA tenderness.  Skin: No rashes, bruises or suspicious lesions. Lymph: No cervical or inguinal adenopathy. Neurologic: Grossly intact, no focal deficits, moving all 4 extremities. Psychiatric: Normal mood and affect.  Laboratory Data: Lab Results  Component Value Date   WBC 7.7 12/07/2006   HGB 8.4* 12/07/2006   HCT 24.9* 12/07/2006   MCV 87.8 12/07/2006   PLT 161 12/07/2006    Lab Results  Component Value Date   CREATININE 1.32* 05/24/2013    No results found for: PSA  No results found for: TESTOSTERONE  Lab  Results  Component Value Date   HGBA1C  11/30/2006    5.5 (NOTE)   The ADA recommends the following therapeutic goals for glycemic   control related to Hgb A1C measurement:   Goal of Therapy:   < 7.0% Hgb A1C   Action Suggested:  > 8.0% Hgb A1C   Ref:  Diabetes Care, 22, Suppl. 1, 1999    Urinalysis    Component Value Date/Time   COLORURINE YELLOW 11/30/2006 1230   APPEARANCEUR CLOUDY* 11/30/2006 1230   LABSPEC 1.015 11/30/2006 1230   PHURINE 6.0 11/30/2006 1230   GLUCOSEU Negative 12/01/2014 [place] 11/30/2006 1230   BILIRUBINUR Negative 12/01/2014 [place] 11/30/2006 1230   KETONESUR NEGATIVE 11/30/2006 1230   PROTEINUR NEGATIVE 11/30/2006 1230   UROBILINOGEN 0.2 11/30/2006 1230   NITRITE Negative 12/01/2014 1045   NITRITE NEGATIVE 11/30/2006 1230   LEUKOCYTESUR Negative  12/01/2014 1045   LEUKOCYTESUR SMALL* 11/30/2006 1230    Pertinent Imaging: None because of decreased renal function  Assessment and Plan: Gross Hematuria  Bilateral retrogrades      Problem List Items Addressed This Visit    None      No Follow-up on file.  Collier Flowers, [facility] [address], [place], [place] [address] [phone]

## 2014-12-29 NOTE — Telephone Encounter (Signed)
LMOM-medication sent to pharmacy 

## 2014-12-30 ENCOUNTER — Other Ambulatory Visit: Payer: Self-pay

## 2014-12-30 DIAGNOSIS — Z09 Encounter for follow-up examination after completed treatment for conditions other than malignant neoplasm: Secondary | ICD-10-CM

## 2014-12-30 MED ORDER — SULFAMETHOXAZOLE-TRIMETHOPRIM 800-160 MG PO TABS: 1.0000 | ORAL_TABLET | Freq: Every day | ORAL | Status: AC

## 2014-12-30 NOTE — Telephone Encounter (Signed)
-----   Message from Nori Riis, PA-C sent at 12/28/2014  9:50 PM EDT ----- Patient has a +UCx.  They need to start Bactrim one twice daily for seven days and then we need to check a clean catch specimen in 3 to 5 days after they complete their antibiotics.  She is having cystoscopy with RTG's on Monday.

## 2014-12-30 NOTE — Progress Notes (Signed)
Per Dr. Elnoria Howard pt was supposed to have bactrim once dialy for 60 days. [facility] called stating they never received the script. New script was sent.

## 2014-12-30 NOTE — Telephone Encounter (Signed)
Spoke with pt in reference to infection. Pt stated Dr. Elnoria Howard and Larene Beach both called in bactrim. Per Dr. Elnoria Howard pt should take bactrim once a day for 60 days. Pt voiced understanding.

## 2015-01-01 NOTE — Anesthesia Postprocedure Evaluation (Signed)
  Anesthesia Post-op Note  Patient: [name]  Procedure(s) Performed: Procedure(s): CYSTOSCOPY WITH RETROGRADE PYELOGRAM (Bilateral) CYSTOSCOPY WITH BIOPSY (N/A)  Anesthesia type:General  Patient location: PACU  Post pain: Pain level controlled  Post assessment: Post-op Vital signs reviewed, Patient's Cardiovascular Status Stable, Respiratory Function Stable, Patent Airway and No signs of Nausea or vomiting  Post vital signs: Reviewed and stable  Last Vitals:  Filed Vitals:   12/29/14 1622  BP: 129/57  Pulse: 66  Temp:   Resp: 16    Level of consciousness: awake, alert  and patient cooperative  Complications: No apparent anesthesia complications

## 2015-01-02 LAB — SURGICAL PATHOLOGY

## 2015-01-13 ENCOUNTER — Ambulatory Visit (INDEPENDENT_AMBULATORY_CARE_PROVIDER_SITE_OTHER): Payer: Medicare Other | Admitting: Urology

## 2015-01-13 VITALS — BP 127/70 | HR 82 | Ht 60.0 in | Wt 101.8 lb

## 2015-01-13 DIAGNOSIS — R310 Gross hematuria: Secondary | ICD-10-CM

## 2015-01-13 LAB — URINALYSIS, COMPLETE
Bilirubin, UA: POSITIVE — AB
GLUCOSE, UA: NEGATIVE
KETONES UA: NEGATIVE
Nitrite, UA: NEGATIVE
PROTEIN UA: NEGATIVE
SPEC GRAV UA: 1.02 (ref 1.005–1.030)
Urobilinogen, Ur: 0.2 mg/dL (ref 0.2–1.0)
pH, UA: 5.5 (ref 5.0–7.5)

## 2015-01-13 MED ORDER — NITROFURANTOIN MONOHYD MACRO 100 MG PO CAPS: 100.0000 mg | ORAL_CAPSULE | Freq: Every day | ORAL | Status: DC

## 2015-01-13 NOTE — Progress Notes (Signed)
01/13/2015 9:39 AM   Ellene Route 1946/03/23 [id number]  Referring provider: Tracie Harrier, MD [place], [place] [address]  Chief Complaint  Patient presents with  . 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 Presently no complaint of urgency frequency dysuria or pain has been on Bactrim DS twice a day. HPI   PMH: Past Medical History  Diagnosis Date  . Acid reflux   . CAD (coronary artery disease)   . Chronic kidney disease     stage 3 chronic kidney disease  . IBS (irritable bowel syndrome) 2004    Surgical History: Past Surgical History  Procedure Laterality Date  . Abdominal hysterectomy    . Tendon repair      right elbow  . Hand surgery  1998  . Trigger finger release    . Cholecystectomy    . Sigmoid resection / rectopexy  2006  . Cardiac surgery  2007    cardiac stent place  . Coronary artery bypass graft      triple  . Cataract extraction  2012  . Skin cancer excision  2013  . Coronary angioplasty with stent placement  2013  . Cystoscopy w/ retrogrades Bilateral 12/29/2014    Procedure: CYSTOSCOPY WITH RETROGRADE PYELOGRAM;  Surgeon: Collier Flowers, MD;  Location: [facility] ORS;  Service: Urology;  Laterality: Bilateral;  . Cystoscopy with biopsy N/A 12/29/2014    Procedure: CYSTOSCOPY WITH BIOPSY;  Surgeon: Collier Flowers, MD;  Location: [facility] ORS;  Service: Urology;  Laterality: N/A;    Home Medications:    Medication List       This list is accurate as of: 01/13/15  9:39 AM.  Always use your most recent med list.               aspirin 325 MG tablet  Take 325 mg by mouth daily with lunch.     clopidogrel 75 MG tablet  Commonly known as:  PLAVIX  TAKE 1 TABLET BY MOUTH EVERY DAY at lunch time     [facility] STOOL SOFTENER 50 MG capsule  Generic drug:  docusate sodium  Take 50 mg by mouth daily after lunch.     DEXILANT  60 MG capsule  Generic drug:  dexlansoprazole  Take 60 mg by mouth every morning.     DULCOLAX 5 MG EC tablet  Generic drug:  bisacodyl  Take 40 mg by mouth at bedtime. Takes 8 tablets every night to prevent a bowel obstruction.     furosemide 40 MG tablet  Commonly known as:  LASIX  Take 40 mg by mouth daily with lunch. As needed.     hyoscyamine 0.125 MG SL tablet  Commonly known as:  LEVSIN SL  0.125 mg 2 (two) times daily before lunch and supper.     isosorbide mononitrate 60 MG 24 hr tablet  Commonly known as:  IMDUR  TAKE 1 TABLET BY MOUTH EVERY MORNING     MAGNESIUM CITRATE PO  Take 200 mg by mouth PC lunch.     metoprolol succinate 25 MG 24 hr tablet  Commonly known as:  TOPROL-XL  Take 25 mg by mouth daily with lunch.     nitroGLYCERIN 0.4 MG SL tablet  Commonly known as:  NITROSTAT  Place 0.4 mg under the tongue every 5 (five) minutes as needed.     oxyCODONE-acetaminophen  5-325 MG per tablet  Commonly known as:  ROXICET  Take 1 tablet by mouth every 6 (six) hours as needed for moderate pain or severe pain.     potassium chloride 10 MEQ CR capsule  Commonly known as:  MICRO-K  Take 10 mEq by mouth 2 (two) times daily. 2 capsules     promethazine 25 MG tablet  Commonly known as:  PHENERGAN  Take 25 mg by mouth every 8 (eight) hours as needed.     riboflavin 100 MG Tabs tablet  Commonly known as:  VITAMIN B-2  Take 100 mg by mouth daily with lunch.     senna 8.6 MG tablet  Commonly known as:  SENOKOT  Take by mouth.     sulfamethoxazole-trimethoprim 800-160 MG per tablet  Commonly known as:  BACTRIM DS,SEPTRA DS  Take 1 tablet by mouth daily.     SUPER PROBIOTIC Caps  Take 1 capsule by mouth daily with lunch.     TYLENOL 500 MG tablet  Generic drug:  acetaminophen  Take 500 mg by mouth every 6 (six) hours as needed for moderate pain.     Vitamin D (Ergocalciferol) 50000 UNITS Caps capsule  Commonly known as:  DRISDOL  Take 50,000 Units by mouth  every 14 (fourteen) days.        Allergies:  Allergies  Allergen Reactions  . Prochlorperazine Nausea And Vomiting    Severe vomiting  . Ezetimibe     Other reaction(s): Muscle Pain  . Statins     Other reaction(s): Muscle Pain  . Tape Other (See Comments)    Pulls my skin off when the tape is removed. Use paper tape only.  . Cephalexin Rash  . Metoclopramide Rash    Elevates BP, face draws to one side  . Penicillin G Rash  . Penicillins Rash    Family History: Family History  Problem Relation Age of Onset  . Kidney Stones Father   . Kidney Stones Brother   . Prostate cancer Father   . Heart disease Father   . Heart disease Mother     Social History:  reports that she has never smoked. She does not have any smokeless tobacco history on file. She reports that she does not drink alcohol or use illicit drugs.  ROS:   GU-no urgency frequency dysuria pyuria    GI no nausea vomiting.      integumentary frequent bruising secondary probably to her anticholinergics. Cardiovascular previous history of coronary artery bypass with 3 stents and use of beta blockers for coronary artery disease.                            Physical Exam: LMP 11/03/1980  Constitutional:  Alert and oriented, No acute distress. HEENT: [place] AT, moist mucus membranes.  Trachea midline, no masses. Cardiovascular: No clubbing, cyanosis, or edema. Respiratory: Normal respiratory effort, no increased work of breathing. GI: Abdomen is soft, nontender, nondistended, no abdominal masses GU: No CVA tenderness.  Skin: No rashes, bruises or suspicious lesions. Lymph: No cervical or inguinal adenopathy. Neurologic: Grossly intact, no focal deficits, moving all 4 extremities. Psychiatric: Normal mood and affect.  Laboratory Data: Lab Results  Component Value Date   WBC 7.7 12/07/2006   HGB 8.4* 12/07/2006   HCT 24.9* 12/07/2006   MCV 87.8 12/07/2006   PLT 161 12/07/2006    Lab  Results  Component Value Date   CREATININE 1.32* 05/24/2013  No results found for: PSA  No results found for: TESTOSTERONE  Lab Results  Component Value Date   HGBA1C  11/30/2006    5.5 (NOTE)   The ADA recommends the following therapeutic goals for glycemic   control related to Hgb A1C measurement:   Goal of Therapy:   < 7.0% Hgb A1C   Action Suggested:  > 8.0% Hgb A1C   Ref:  Diabetes Care, 22, Suppl. 1, 1999    Urinalysis    Component Value Date/Time   COLORURINE YELLOW 11/30/2006 1230   APPEARANCEUR CLOUDY* 11/30/2006 1230   LABSPEC 1.015 11/30/2006 1230   PHURINE 6.0 11/30/2006 1230   GLUCOSEU Negative 12/01/2014 [place] 11/30/2006 1230   BILIRUBINUR Negative 12/01/2014 [place] 11/30/2006 1230   KETONESUR NEGATIVE 11/30/2006 1230   PROTEINUR NEGATIVE 11/30/2006 1230   UROBILINOGEN 0.2 11/30/2006 1230   NITRITE Negative 12/01/2014 1045   NITRITE NEGATIVE 11/30/2006 1230   LEUKOCYTESUR Negative 12/01/2014 1045   LEUKOCYTESUR SMALL* 11/30/2006 1230    Pertinent Imaging: GFR is presently varying between 31 and 41. 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 Path report reviewed with the patient. We will stop her Bactrim DS daily twice a day and begin her on Macrobid 100 mg daily see her in follow-up in 2-3 months for recheck of her urinalysis. Recheck in 2 months or urinalysis      Problem List Items Addressed This Visit      Genitourinary   Gross hematuria - Primary   Relevant Orders   Urinalysis, Complete      No Follow-up on file.  Collier Flowers, [facility] [address] [place], [place]  [address] [phone]

## 2015-01-15 ENCOUNTER — Encounter: Payer: Self-pay | Admitting: Urology

## 2015-02-18 NOTE — Telephone Encounter (Signed)
Everything taken care of

## 2015-05-13 ENCOUNTER — Ambulatory Visit (INDEPENDENT_AMBULATORY_CARE_PROVIDER_SITE_OTHER): Payer: Medicare Other | Admitting: Urology

## 2015-05-13 ENCOUNTER — Encounter: Payer: Self-pay | Admitting: Urology

## 2015-05-13 VITALS — BP 95/62 | HR 67 | Ht 60.0 in | Wt 103.5 lb

## 2015-05-13 DIAGNOSIS — R3129 Other microscopic hematuria: Secondary | ICD-10-CM | POA: Diagnosis not present

## 2015-05-13 DIAGNOSIS — Z87440 Personal history of urinary (tract) infections: Secondary | ICD-10-CM

## 2015-05-13 LAB — URINALYSIS, COMPLETE
Bilirubin, UA: NEGATIVE
Glucose, UA: NEGATIVE
KETONES UA: NEGATIVE
LEUKOCYTES UA: NEGATIVE
NITRITE UA: NEGATIVE
PH UA: 5.5 (ref 5.0–7.5)
Protein, UA: NEGATIVE
RBC, UA: NEGATIVE
SPEC GRAV UA: 1.01 (ref 1.005–1.030)
Urobilinogen, Ur: 0.2 mg/dL (ref 0.2–1.0)

## 2015-05-13 LAB — MICROSCOPIC EXAMINATION: Bacteria, UA: NONE SEEN

## 2015-05-13 NOTE — Progress Notes (Signed)
05/13/2015 11:38 AM   Ellene Route Jul 11, 1945 [id number]  Referring provider: Tracie Harrier, MD [address] [facility] [place], [place] [address]  Chief Complaint  Patient presents with  . Hematuria    3-31month    HPI: 70 year old female initially referred to me [facility] for workup of microscopic hematuria.  She was eventually taken to the OR on 12/29/2014 for cystoscopy with bilateral retrogrades. She was found to have a small cystic-type lesion which was biopsied and consistent with cystitis cystica. Bilateral retrogrades were negative.  She also has history of recurrent urinary tract infections, approximately 4 UTIs over the past year.  Over the past month, she reports that her renal function has decompensated and she is following up with her nephrologist in the near future. She does have a history of renal artery stenosis.  Since her last visit, she has been taking chronic daily Macrobid for UTI prophylaxis. She is not had urinary tract infection in the interim within at her preop.  No gross hematuria or voiding complaints.  She has been treated for 2 vaginal yeast infections with Monistat since her last visit. She has not been using vaginal estrogen cream.   PMH: Past Medical History  Diagnosis Date  . Acid reflux   . CAD (coronary artery disease)   . Chronic kidney disease     stage 3 chronic kidney disease  . IBS (irritable bowel syndrome) 2004    Surgical History: Past Surgical History  Procedure Laterality Date  . Abdominal hysterectomy    . Tendon repair      right elbow  . Hand surgery  1998  . Trigger finger release    . Cholecystectomy    . Sigmoid resection / rectopexy  2006  . Cardiac surgery  2007    cardiac stent place  . Coronary artery bypass graft      triple  . Cataract extraction  2012  . Skin cancer excision  2013  . Coronary angioplasty with stent placement  2013  . Cystoscopy w/ retrogrades Bilateral 12/29/2014   Procedure: CYSTOSCOPY WITH RETROGRADE PYELOGRAM;  Surgeon: Collier Flowers, MD;  Location: [facility] ORS;  Service: Urology;  Laterality: Bilateral;  . Cystoscopy with biopsy N/A 12/29/2014    Procedure: CYSTOSCOPY WITH BIOPSY;  Surgeon: Collier Flowers, MD;  Location: [facility] ORS;  Service: Urology;  Laterality: N/A;    Home Medications:    Medication List       This list is accurate as of: 05/13/15 11:38 AM.  Always use your most recent med list.               aspirin 325 MG tablet  Take 325 mg by mouth daily with lunch.     clopidogrel 75 MG tablet  Commonly known as:  PLAVIX  TAKE 1 TABLET BY MOUTH EVERY DAY at lunch time     [facility] STOOL SOFTENER 50 MG capsule  Generic drug:  docusate sodium  Take 50 mg by mouth daily after lunch.     DEXILANT 60 MG capsule  Generic drug:  dexlansoprazole  Take 60 mg by mouth every morning.     DULCOLAX 5 MG EC tablet  Generic drug:  bisacodyl  Take 40 mg by mouth at bedtime. Takes 8 tablets every night to prevent a bowel obstruction.     furosemide 40 MG tablet  Commonly known as:  LASIX  Take 40 mg by mouth daily with lunch. As needed.     hyoscyamine  0.125 MG SL tablet  Commonly known as:  LEVSIN SL  0.125 mg 2 (two) times daily before lunch and supper.     isosorbide mononitrate 60 MG 24 hr tablet  Commonly known as:  IMDUR  TAKE 1 TABLET BY MOUTH EVERY MORNING     MAGNESIUM CITRATE PO  Take 200 mg by mouth PC lunch.     metoprolol succinate 25 MG 24 hr tablet  Commonly known as:  TOPROL-XL  Take 25 mg by mouth daily with lunch.     nitroGLYCERIN 0.4 MG SL tablet  Commonly known as:  NITROSTAT  Place 0.4 mg under the tongue every 5 (five) minutes as needed.     potassium chloride 10 MEQ CR capsule  Commonly known as:  MICRO-K  Take 10 mEq by mouth 2 (two) times daily. 2 capsules     promethazine 25 MG tablet  Commonly known as:  PHENERGAN  Take 25 mg by mouth every 8 (eight) hours as needed.     riboflavin 100 MG Tabs tablet   Commonly known as:  VITAMIN B-2  Take 100 mg by mouth daily with lunch.     senna 8.6 MG tablet  Commonly known as:  SENOKOT  Take by mouth.     SUPER PROBIOTIC Caps  Take 1 capsule by mouth daily with lunch.     TYLENOL 500 MG tablet  Generic drug:  acetaminophen  Take 500 mg by mouth every 6 (six) hours as needed for moderate pain.     Vitamin D (Ergocalciferol) 50000 units Caps capsule  Commonly known as:  DRISDOL  Take 50,000 Units by mouth every 14 (fourteen) days.        Allergies:  Allergies  Allergen Reactions  . Prochlorperazine Nausea And Vomiting    Severe vomiting  . Ezetimibe     Other reaction(s): Muscle Pain  . Statins     Other reaction(s): Muscle Pain  . Tape Other (See Comments)    Pulls my skin off when the tape is removed. Use paper tape only.  . Cephalexin Rash  . Metoclopramide Rash    Elevates BP, face draws to one side  . Penicillin G Rash  . Penicillins Rash    Family History: Family History  Problem Relation Age of Onset  . Kidney Stones Father   . Kidney Stones Brother   . Prostate cancer Father   . Heart disease Father   . Heart disease Mother     Social History:  reports that she has never smoked. She does not have any smokeless tobacco history on file. She reports that she does not drink alcohol or use illicit drugs.  ROS: UROLOGY Frequent Urination?: No Hard to postpone urination?: No Burning/pain with urination?: No Get up at night to urinate?: No Leakage of urine?: No Urine stream starts and stops?: No Trouble starting stream?: No Do you have to strain to urinate?: No Blood in urine?: No Urinary tract infection?: No Sexually transmitted disease?: No Injury to kidneys or bladder?: No Painful intercourse?: No Weak stream?: No Currently pregnant?: No Vaginal bleeding?: No Last menstrual period?: n  Gastrointestinal Nausea?: No Vomiting?: No Indigestion/heartburn?: Yes Diarrhea?: No Constipation?:  No  Constitutional Fever: No Night sweats?: No Weight loss?: No Fatigue?: No  Skin Skin rash/lesions?: No Itching?: No  Eyes Blurred vision?: No Double vision?: No  Ears/Nose/Throat Sore throat?: No Sinus problems?: No  Hematologic/Lymphatic Swollen glands?: No Easy bruising?: Yes  Cardiovascular Leg swelling?: No Chest pain?: No  Respiratory Cough?: Yes Shortness of breath?: Yes  Endocrine Excessive thirst?: No  Musculoskeletal Back pain?: Yes Joint pain?: No  Neurological Headaches?: Yes Dizziness?: Yes  Psychologic Depression?: No Anxiety?: No  Physical Exam: BP 95/62 mmHg  Pulse 67  Ht 5' (1.524 m)  Wt 103 lb 8 oz (46.947 kg)  BMI 20.21 kg/m2  LMP 11/03/1980  Constitutional:  Alert and oriented, No acute distress. HEENT: [place] AT, moist mucus membranes.  Trachea midline, no masses. Cardiovascular: No clubbing, cyanosis, or edema. Respiratory: Normal respiratory effort, no increased work of breathing. GI: Abdomen is soft, nontender, nondistended, no abdominal masses GU: No CVA tenderness or suprapubic tenderness. Skin: No rashes, bruises or suspicious lesions. Neurologic: Grossly intact, no focal deficits, moving all 4 extremities. Psychiatric: Normal mood and affect.  Laboratory Data: Lab Results  Component Value Date   WBC 7.7 12/07/2006   HGB 8.4* 12/07/2006   HCT 24.9* 12/07/2006   MCV 87.8 12/07/2006   PLT 161 12/07/2006    Lab Results  Component Value Date   CREATININE 1.32* 05/24/2013     Lab Results  Component Value Date   HGBA1C  11/30/2006    5.5 (NOTE)   The ADA recommends the following therapeutic goals for glycemic   control related to Hgb A1C measurement:   Goal of Therapy:   < 7.0% Hgb A1C   Action Suggested:  > 8.0% Hgb A1C   Ref:  Diabetes Care, 22, Suppl. 1, 1999    Urinalysis UA today negative  Pertinent Imaging: n/a  Assessment & Plan:    1. History of microscopic hematuria S/p work up including CT  stone, B RTG and cysto/ biopsy which was negative in 12/2014.  UA today negative.  No further work up indicated.   - Urinalysis, Complete  2. History of recurrent UTIs Stop abx ppx Recommend increasing water intake Recommend addition of cranberry tabs bid No currently using estrace cream but consider in future if UTI frequency increases RTC sooner if signs, symptoms of infection   Return in about 6 months (around 11/10/2015) for with [facility], recheck in recurrent UTIs.  Hollice Espy, MD  [facility] [address], [place], [place] [address] [phone]

## 2015-05-15 ENCOUNTER — Ambulatory Visit: Payer: Medicare Other | Admitting: Urology

## 2015-08-20 DIAGNOSIS — R079 Chest pain, unspecified: Secondary | ICD-10-CM | POA: Insufficient documentation

## 2015-09-14 ENCOUNTER — Inpatient Hospital Stay: Payer: Medicare Other | Attending: Oncology | Admitting: Oncology

## 2015-09-14 ENCOUNTER — Encounter: Payer: Self-pay | Admitting: Oncology

## 2015-09-14 ENCOUNTER — Ambulatory Visit: Payer: Medicare Other

## 2015-09-14 VITALS — Wt 104.3 lb

## 2015-09-14 DIAGNOSIS — Z955 Presence of coronary angioplasty implant and graft: Secondary | ICD-10-CM | POA: Diagnosis not present

## 2015-09-14 DIAGNOSIS — K589 Irritable bowel syndrome without diarrhea: Secondary | ICD-10-CM | POA: Insufficient documentation

## 2015-09-14 DIAGNOSIS — D509 Iron deficiency anemia, unspecified: Secondary | ICD-10-CM | POA: Diagnosis not present

## 2015-09-14 DIAGNOSIS — I341 Nonrheumatic mitral (valve) prolapse: Secondary | ICD-10-CM | POA: Insufficient documentation

## 2015-09-14 DIAGNOSIS — N183 Chronic kidney disease, stage 3 (moderate): Secondary | ICD-10-CM | POA: Diagnosis not present

## 2015-09-14 DIAGNOSIS — Z7982 Long term (current) use of aspirin: Secondary | ICD-10-CM | POA: Diagnosis not present

## 2015-09-14 DIAGNOSIS — Z79899 Other long term (current) drug therapy: Secondary | ICD-10-CM

## 2015-09-14 DIAGNOSIS — K219 Gastro-esophageal reflux disease without esophagitis: Secondary | ICD-10-CM | POA: Diagnosis not present

## 2015-09-14 DIAGNOSIS — E785 Hyperlipidemia, unspecified: Secondary | ICD-10-CM | POA: Insufficient documentation

## 2015-09-14 DIAGNOSIS — K5909 Other constipation: Secondary | ICD-10-CM | POA: Diagnosis not present

## 2015-09-14 DIAGNOSIS — I2510 Atherosclerotic heart disease of native coronary artery without angina pectoris: Secondary | ICD-10-CM | POA: Insufficient documentation

## 2015-09-14 DIAGNOSIS — I129 Hypertensive chronic kidney disease with stage 1 through stage 4 chronic kidney disease, or unspecified chronic kidney disease: Secondary | ICD-10-CM | POA: Insufficient documentation

## 2015-09-14 DIAGNOSIS — G43909 Migraine, unspecified, not intractable, without status migrainosus: Secondary | ICD-10-CM | POA: Insufficient documentation

## 2015-09-14 DIAGNOSIS — Z951 Presence of aortocoronary bypass graft: Secondary | ICD-10-CM | POA: Diagnosis not present

## 2015-09-14 DIAGNOSIS — Z85828 Personal history of other malignant neoplasm of skin: Secondary | ICD-10-CM | POA: Insufficient documentation

## 2015-09-14 DIAGNOSIS — Z8711 Personal history of peptic ulcer disease: Secondary | ICD-10-CM | POA: Diagnosis not present

## 2015-09-14 LAB — CBC
HEMATOCRIT: 38.5 % (ref 35.0–47.0)
HEMOGLOBIN: 12.8 g/dL (ref 12.0–16.0)
MCH: 26.8 pg (ref 26.0–34.0)
MCHC: 33.1 g/dL (ref 32.0–36.0)
MCV: 80.8 fL (ref 80.0–100.0)
Platelets: 366 10*3/uL (ref 150–440)
RBC: 4.77 MIL/uL (ref 3.80–5.20)
RDW: 14.8 % — ABNORMAL HIGH (ref 11.5–14.5)
WBC: 6.2 10*3/uL (ref 3.6–11.0)

## 2015-09-14 LAB — LACTATE DEHYDROGENASE: LDH: 158 U/L (ref 98–192)

## 2015-09-14 LAB — IRON AND TIBC
Iron: 31 ug/dL (ref 28–170)
SATURATION RATIOS: 7 % — AB (ref 10.4–31.8)
TIBC: 466 ug/dL — ABNORMAL HIGH (ref 250–450)
UIBC: 435 ug/dL

## 2015-09-14 LAB — DAT, POLYSPECIFIC AHG (ARMC ONLY): Polyspecific AHG test: NEGATIVE

## 2015-09-14 LAB — FERRITIN: Ferritin: 7 ng/mL — ABNORMAL LOW (ref 11–307)

## 2015-09-14 LAB — VITAMIN B12: Vitamin B-12: 314 pg/mL (ref 180–914)

## 2015-09-14 LAB — FOLATE: Folate: 44 ng/mL (ref >5.9)

## 2015-09-14 NOTE — Progress Notes (Signed)
Patient last seen in 2012 for anemia and iron infusions.  Has been feeling more tired that her usual also having frequent headaches.  GI MD checked labs and her ferritin and iron were low.

## 2015-09-15 LAB — PROTEIN ELECTROPHORESIS, SERUM
A/G Ratio: 1.5 (ref 0.7–1.7)
ALPHA-1-GLOBULIN: 0.3 g/dL (ref 0.0–0.4)
ALPHA-2-GLOBULIN: 0.8 g/dL (ref 0.4–1.0)
Albumin ELP: 4.2 g/dL (ref 2.9–4.4)
BETA GLOBULIN: 1.3 g/dL (ref 0.7–1.3)
GLOBULIN, TOTAL: 2.8 g/dL (ref 2.2–3.9)
Gamma Globulin: 0.4 g/dL (ref 0.4–1.8)
Total Protein ELP: 7 g/dL (ref 6.0–8.5)

## 2015-09-15 LAB — ERYTHROPOIETIN: ERYTHROPOIETIN: 10.2 m[IU]/mL (ref 2.6–18.5)

## 2015-09-15 LAB — ANA W/REFLEX: Anti Nuclear Antibody(ANA): NEGATIVE

## 2015-09-21 ENCOUNTER — Inpatient Hospital Stay: Payer: Medicare Other

## 2015-09-21 VITALS — BP 115/66 | HR 67 | Temp 98.5°F

## 2015-09-21 DIAGNOSIS — D509 Iron deficiency anemia, unspecified: Secondary | ICD-10-CM

## 2015-09-21 MED ORDER — SODIUM CHLORIDE 0.9 % IV SOLN
Freq: Once | INTRAVENOUS | Status: AC
  Administered 2015-09-21: 14:00:00 via INTRAVENOUS
  Filled 2015-09-21: qty 1000

## 2015-09-21 MED ORDER — SODIUM CHLORIDE 0.9 % IV SOLN
510.0000 mg | Freq: Once | INTRAVENOUS | Status: AC
  Administered 2015-09-21: 510 mg via INTRAVENOUS
  Filled 2015-09-21: qty 17

## 2015-09-23 NOTE — Progress Notes (Signed)
[place]  Telephone:([phone]) [phone] [phone]) [phone]  ID: [name] OB: December 13, 1945  MR#: [id number]  [id number]  Patient Care Team: Tracie Harrier, MD as PCP - General (Internal Medicine)  CHIEF COMPLAINT:  Chief Complaint  Patient presents with  . New Evaluation    INTERVAL HISTORY: Patient is a 70 year old female whose last evaluated greater than 5 years ago for iron deficiency anemia. She received IV iron at that time. Recently she has become more weak and fatigued and also having frequent headaches which she attributes to anemia. She was seen by her GI physician and found to have a decreased hemoglobin as well as iron stores. She has no recent fevers or illnesses. She denies any other neurologic complaints. She has a good appetite and denies weight loss. She has no chest pain or shortness of breath. She denies any nausea, vomiting, constipation, or diarrhea. She has no melena or hematochezia. Patient otherwise feels well and offers no further specific complaints.  REVIEW OF SYSTEMS:   Review of Systems  Constitutional: Positive for malaise/fatigue. Negative for fever and weight loss.  Respiratory: Negative for cough and shortness of breath.   Cardiovascular: Negative.  Negative for chest pain and leg swelling.  Gastrointestinal: Negative.  Negative for blood in stool and melena.  Genitourinary: Negative.  Negative for hematuria.  Neurological: Positive for weakness.  Psychiatric/Behavioral: Negative.     As per HPI. Otherwise, a complete review of systems is negatve.  PAST MEDICAL HISTORY: Past Medical History  Diagnosis Date  . Acid reflux   . CAD (coronary artery disease)   . Chronic kidney disease     stage 3 chronic kidney disease  . IBS (irritable bowel syndrome) 2004  . Peptic ulcer 1989  . Chronic constipation   . Migraine   . Mitral valve prolapse   . Eczema   . Skin cancer   . Hypertension   . Anemia   . Hyperlipidemia    . H/O heart artery stent     PAST SURGICAL HISTORY: Past Surgical History  Procedure Laterality Date  . Abdominal hysterectomy    . Tendon repair      right elbow  . Hand surgery  1998  . Trigger finger release    . Cholecystectomy    . Sigmoid resection / rectopexy  2006  . Cardiac surgery  2007    cardiac stent place  . Coronary artery bypass graft      triple  . Cataract extraction  2012  . Skin cancer excision  2013  . Coronary angioplasty with stent placement  2013  . Cystoscopy w/ retrogrades Bilateral 12/29/2014    Procedure: CYSTOSCOPY WITH RETROGRADE PYELOGRAM;  Surgeon: Collier Flowers, MD;  Location: [facility] ORS;  Service: Urology;  Laterality: Bilateral;  . Cystoscopy with biopsy N/A 12/29/2014    Procedure: CYSTOSCOPY WITH BIOPSY;  Surgeon: Collier Flowers, MD;  Location: [facility] ORS;  Service: Urology;  Laterality: N/A;    FAMILY HISTORY Family History  Problem Relation Age of Onset  . Kidney Stones Father   . Kidney Stones Brother   . Prostate cancer Father   . Heart disease Father   . Heart disease Mother        ADVANCED DIRECTIVES:    HEALTH MAINTENANCE: Social History  Substance Use Topics  . Smoking status: Never Smoker   . Smokeless tobacco: Not on file  . Alcohol Use: No     Colonoscopy:  PAP:  Bone density:  Lipid panel:  Allergies  Allergen Reactions  . Prochlorperazine Nausea And Vomiting    Severe vomiting  . Ezetimibe     Other reaction(s): Muscle Pain  . Statins     Other reaction(s): Muscle Pain  . Tape Other (See Comments)    Pulls my skin off when the tape is removed. Use paper tape only.  . Cephalexin Rash  . Metoclopramide Rash    Elevates BP, face draws to one side  . Penicillin G Rash  . Penicillins Rash    Current Outpatient Prescriptions  Medication Sig Dispense Refill  . acetaminophen (TYLENOL) 500 MG tablet Take 500 mg by mouth every 6 (six) hours as needed for moderate pain.     Marland Kitchen aspirin 325 MG tablet Take 325  mg by mouth daily with lunch.     . bisacodyl (DULCOLAX) 5 MG EC tablet Take 40 mg by mouth at bedtime. Takes 8 tablets every night to prevent a bowel obstruction.    . clopidogrel (PLAVIX) 75 MG tablet TAKE 1 TABLET BY MOUTH EVERY DAY at lunch time    . dexlansoprazole (DEXILANT) 60 MG capsule Take 60 mg by mouth every morning.     . docusate sodium (CVS STOOL SOFTENER) 50 MG capsule Take 50 mg by mouth daily after lunch.     . furosemide (LASIX) 40 MG tablet Take 40 mg by mouth daily with lunch. As needed.    . hyoscyamine (LEVSIN SL) 0.125 MG SL tablet 0.125 mg 2 (two) times daily before lunch and supper.     . isosorbide mononitrate (IMDUR) 60 MG 24 hr tablet TAKE 1 TABLET BY MOUTH EVERY MORNING    . MAGNESIUM CITRATE PO Take 200 mg by mouth PC lunch.     . metoprolol succinate (TOPROL-XL) 25 MG 24 hr tablet Take 25 mg by mouth daily with lunch.     . nitroGLYCERIN (NITROSTAT) 0.4 MG SL tablet Place 0.4 mg under the tongue every 5 (five) minutes as needed.     . potassium chloride (MICRO-K) 10 MEQ CR capsule Take 10 mEq by mouth 2 (two) times daily. 2 capsules    . Probiotic Product (SUPER PROBIOTIC) CAPS Take 1 capsule by mouth daily with lunch.     . promethazine (PHENERGAN) 25 MG tablet Take 25 mg by mouth every 8 (eight) hours as needed.     . riboflavin (VITAMIN B-2) 100 MG TABS tablet Take 100 mg by mouth daily with lunch.     . senna (SENOKOT) 8.6 MG tablet Take by mouth.    . Vitamin D, Ergocalciferol, (DRISDOL) 50000 UNITS CAPS capsule Take 50,000 Units by mouth every 14 (fourteen) days.      No current facility-administered medications for this visit.    OBJECTIVE: There were no vitals filed for this visit.   Body mass index is 20.37 kg/(m^2).    ECOG FS:0 - Asymptomatic  General: Well-developed, well-nourished, no acute distress. Eyes: Pink conjunctiva, anicteric sclera. HEENT: Normocephalic, moist mucous membranes, clear oropharnyx. Lungs: Clear to auscultation  bilaterally. Heart: Regular rate and rhythm. No rubs, murmurs, or gallops. Abdomen: Soft, nontender, nondistended. No organomegaly noted, normoactive bowel sounds. Musculoskeletal: No edema, cyanosis, or clubbing. Neuro: Alert, answering all questions appropriately. Cranial nerves grossly intact. Skin: No rashes or petechiae noted. Psych: Normal affect. Lymphatics: No cervical, calvicular, axillary or inguinal LAD.   LAB RESULTS:  Lab Results  Component Value Date   NA 137 05/24/2013   K 2.3* 05/24/2013   CL 97* 05/24/2013   CO2  36* 05/24/2013   GLUCOSE 91 05/24/2013   BUN 21* 05/24/2013   CREATININE 1.32* 05/24/2013   CALCIUM 7.9* 05/24/2013   PROT 7.0 11/30/2006   ALBUMIN 4.4 11/30/2006   AST 19 11/30/2006   ALT 11 11/30/2006   ALKPHOS 51 11/30/2006   BILITOT 0.7 11/30/2006   GFRNONAA 42* 05/24/2013   GFRAA 49* 05/24/2013    Lab Results  Component Value Date   WBC 6.2 09/14/2015   HGB 12.8 09/14/2015   HCT 38.5 09/14/2015   MCV 80.8 09/14/2015   PLT 366 09/14/2015   Lab Results  Component Value Date   IRON 31 09/14/2015   TIBC 466* 09/14/2015   IRONPCTSAT 7* 09/14/2015    Lab Results  Component Value Date   FERRITIN 7* 09/14/2015     STUDIES: No results found.  ASSESSMENT:  Iron deficiency.  PLAN:    1. Iron deficiency: Patient's iron stores are significantly decreased, although her hemoglobin is within normal limits at 12.8. She is symptomatic. The remainder of her laboratory work is either negative or within normal limits. Patient will return to clinic in one and 2 weeks to receive 510 mg IV Feraheme. She will then return to clinic in 3 months with repeat laboratory work and further evaluation.  Patient expressed understanding and was in agreement with this plan. She also understands that She can call clinic at any time with any questions, concerns, or complaints.     Lloyd Huger, MD   09/23/2015 9:16 AM

## 2015-09-28 ENCOUNTER — Inpatient Hospital Stay: Payer: Medicare Other

## 2015-09-28 VITALS — BP 109/71 | HR 80 | Temp 98.5°F | Resp 18

## 2015-09-28 DIAGNOSIS — D509 Iron deficiency anemia, unspecified: Secondary | ICD-10-CM | POA: Diagnosis not present

## 2015-09-28 MED ORDER — SODIUM CHLORIDE 0.9 % IV SOLN
Freq: Once | INTRAVENOUS | Status: AC
  Administered 2015-09-28: 13:00:00 via INTRAVENOUS
  Filled 2015-09-28: qty 1000

## 2015-09-28 MED ORDER — SODIUM CHLORIDE 0.9 % IV SOLN
510.0000 mg | Freq: Once | INTRAVENOUS | Status: AC
  Administered 2015-09-28: 510 mg via INTRAVENOUS
  Filled 2015-09-28: qty 17

## 2015-10-21 ENCOUNTER — Ambulatory Visit
Admission: RE | Admit: 2015-10-21 | Discharge: 2015-10-22 | Disposition: A | Discharge status: Home / Self Care | Payer: Medicare Other | Source: Ambulatory Visit | Attending: Cardiology | Admitting: Cardiology

## 2015-10-21 ENCOUNTER — Encounter
Admission: RE | Disposition: A | Discharge status: Home / Self Care | Payer: Self-pay | Source: Ambulatory Visit | Attending: Cardiology

## 2015-10-21 ENCOUNTER — Encounter: Payer: Self-pay | Admitting: Cardiology

## 2015-10-21 DIAGNOSIS — G43909 Migraine, unspecified, not intractable, without status migrainosus: Secondary | ICD-10-CM | POA: Diagnosis not present

## 2015-10-21 DIAGNOSIS — Z8249 Family history of ischemic heart disease and other diseases of the circulatory system: Secondary | ICD-10-CM | POA: Insufficient documentation

## 2015-10-21 DIAGNOSIS — Z833 Family history of diabetes mellitus: Secondary | ICD-10-CM | POA: Diagnosis not present

## 2015-10-21 DIAGNOSIS — I259 Chronic ischemic heart disease, unspecified: Secondary | ICD-10-CM | POA: Diagnosis not present

## 2015-10-21 DIAGNOSIS — M810 Age-related osteoporosis without current pathological fracture: Secondary | ICD-10-CM | POA: Diagnosis not present

## 2015-10-21 DIAGNOSIS — E785 Hyperlipidemia, unspecified: Secondary | ICD-10-CM | POA: Diagnosis not present

## 2015-10-21 DIAGNOSIS — N183 Chronic kidney disease, stage 3 (moderate): Secondary | ICD-10-CM | POA: Insufficient documentation

## 2015-10-21 DIAGNOSIS — Z880 Allergy status to penicillin: Secondary | ICD-10-CM | POA: Diagnosis not present

## 2015-10-21 DIAGNOSIS — I129 Hypertensive chronic kidney disease with stage 1 through stage 4 chronic kidney disease, or unspecified chronic kidney disease: Secondary | ICD-10-CM | POA: Diagnosis not present

## 2015-10-21 DIAGNOSIS — Z825 Family history of asthma and other chronic lower respiratory diseases: Secondary | ICD-10-CM | POA: Insufficient documentation

## 2015-10-21 DIAGNOSIS — Z79899 Other long term (current) drug therapy: Secondary | ICD-10-CM | POA: Insufficient documentation

## 2015-10-21 DIAGNOSIS — Z7902 Long term (current) use of antithrombotics/antiplatelets: Secondary | ICD-10-CM | POA: Insufficient documentation

## 2015-10-21 DIAGNOSIS — X58XXXA Exposure to other specified factors, initial encounter: Secondary | ICD-10-CM | POA: Insufficient documentation

## 2015-10-21 DIAGNOSIS — Z9049 Acquired absence of other specified parts of digestive tract: Secondary | ICD-10-CM | POA: Insufficient documentation

## 2015-10-21 DIAGNOSIS — I341 Nonrheumatic mitral (valve) prolapse: Secondary | ICD-10-CM | POA: Insufficient documentation

## 2015-10-21 DIAGNOSIS — Z23 Encounter for immunization: Secondary | ICD-10-CM | POA: Diagnosis not present

## 2015-10-21 DIAGNOSIS — Z955 Presence of coronary angioplasty implant and graft: Secondary | ICD-10-CM | POA: Diagnosis not present

## 2015-10-21 DIAGNOSIS — R0789 Other chest pain: Secondary | ICD-10-CM | POA: Diagnosis present

## 2015-10-21 DIAGNOSIS — T82858A Stenosis of vascular prosthetic devices, implants and grafts, initial encounter: Secondary | ICD-10-CM | POA: Diagnosis not present

## 2015-10-21 DIAGNOSIS — Z888 Allergy status to other drugs, medicaments and biological substances status: Secondary | ICD-10-CM | POA: Diagnosis not present

## 2015-10-21 DIAGNOSIS — Z85828 Personal history of other malignant neoplasm of skin: Secondary | ICD-10-CM | POA: Diagnosis not present

## 2015-10-21 DIAGNOSIS — Z8042 Family history of malignant neoplasm of prostate: Secondary | ICD-10-CM | POA: Diagnosis not present

## 2015-10-21 DIAGNOSIS — Z823 Family history of stroke: Secondary | ICD-10-CM | POA: Insufficient documentation

## 2015-10-21 DIAGNOSIS — E7800 Pure hypercholesterolemia, unspecified: Secondary | ICD-10-CM | POA: Insufficient documentation

## 2015-10-21 DIAGNOSIS — I2510 Atherosclerotic heart disease of native coronary artery without angina pectoris: Principal | ICD-10-CM | POA: Diagnosis present

## 2015-10-21 DIAGNOSIS — Z881 Allergy status to other antibiotic agents status: Secondary | ICD-10-CM | POA: Diagnosis not present

## 2015-10-21 DIAGNOSIS — Z90710 Acquired absence of both cervix and uterus: Secondary | ICD-10-CM | POA: Diagnosis not present

## 2015-10-21 DIAGNOSIS — K589 Irritable bowel syndrome without diarrhea: Secondary | ICD-10-CM | POA: Insufficient documentation

## 2015-10-21 DIAGNOSIS — Z8711 Personal history of peptic ulcer disease: Secondary | ICD-10-CM | POA: Diagnosis not present

## 2015-10-21 DIAGNOSIS — Z9889 Other specified postprocedural states: Secondary | ICD-10-CM | POA: Insufficient documentation

## 2015-10-21 DIAGNOSIS — Z7982 Long term (current) use of aspirin: Secondary | ICD-10-CM | POA: Diagnosis not present

## 2015-10-21 DIAGNOSIS — Z806 Family history of leukemia: Secondary | ICD-10-CM | POA: Insufficient documentation

## 2015-10-21 DIAGNOSIS — K219 Gastro-esophageal reflux disease without esophagitis: Secondary | ICD-10-CM | POA: Insufficient documentation

## 2015-10-21 HISTORY — PX: CARDIAC CATHETERIZATION: SHX172

## 2015-10-21 LAB — GLUCOSE, CAPILLARY: Glucose-Capillary: 100 mg/dL — ABNORMAL HIGH (ref 65–99)

## 2015-10-21 LAB — MRSA PCR SCREENING: MRSA by PCR: NEGATIVE

## 2015-10-21 SURGERY — LEFT HEART CATH AND CORONARY ANGIOGRAPHY
Anesthesia: Moderate Sedation

## 2015-10-21 MED ORDER — HYDROMORPHONE HCL 1 MG/ML IJ SOLN
INTRAMUSCULAR | Status: AC
  Administered 2015-10-21: 1 mg via INTRAVENOUS
  Filled 2015-10-21: qty 1

## 2015-10-21 MED ORDER — SODIUM CHLORIDE 0.9% FLUSH: 3.0000 mL | INTRAVENOUS | Status: DC | PRN

## 2015-10-21 MED ORDER — HEPARIN (PORCINE) IN NACL 2-0.9 UNIT/ML-% IJ SOLN
INTRAMUSCULAR | Status: AC
  Filled 2015-10-21: qty 500

## 2015-10-21 MED ORDER — SODIUM CHLORIDE 0.9 % WEIGHT BASED INFUSION: 1.0000 mL/kg/h | INTRAVENOUS | Status: DC

## 2015-10-21 MED ORDER — ASPIRIN 81 MG PO CHEW: 81.0000 mg | CHEWABLE_TABLET | ORAL | Status: DC

## 2015-10-21 MED ORDER — SODIUM CHLORIDE 0.9% FLUSH
3.0000 mL | Freq: Two times a day (BID) | INTRAVENOUS | Status: DC
  Administered 2015-10-21 (×2): 3 mL via INTRAVENOUS

## 2015-10-21 MED ORDER — ACETAMINOPHEN 325 MG PO TABS
650.0000 mg | ORAL_TABLET | ORAL | Status: DC | PRN
  Administered 2015-10-22: 650 mg via ORAL
  Filled 2015-10-21: qty 2

## 2015-10-21 MED ORDER — MIDAZOLAM HCL 2 MG/2ML IJ SOLN
INTRAMUSCULAR | Status: DC | PRN
  Administered 2015-10-21: 0.5 mg via INTRAVENOUS

## 2015-10-21 MED ORDER — SODIUM CHLORIDE 0.9 % IV SOLN: 250.0000 mL | INTRAVENOUS | Status: DC | PRN

## 2015-10-21 MED ORDER — HYDROMORPHONE HCL 1 MG/ML IJ SOLN
1.0000 mg | Freq: Once | INTRAMUSCULAR | Status: AC
  Administered 2015-10-21: 1 mg via INTRAVENOUS

## 2015-10-21 MED ORDER — FUROSEMIDE 40 MG PO TABS
40.0000 mg | ORAL_TABLET | Freq: Every day | ORAL | Status: DC
  Administered 2015-10-22: 40 mg via ORAL
  Filled 2015-10-21: qty 1

## 2015-10-21 MED ORDER — MIDAZOLAM HCL 2 MG/2ML IJ SOLN
INTRAMUSCULAR | Status: AC
  Filled 2015-10-21: qty 2

## 2015-10-21 MED ORDER — SODIUM CHLORIDE 0.9 % IV SOLN
INTRAVENOUS | Status: AC
  Administered 2015-10-21: 07:00:00 via INTRAVENOUS

## 2015-10-21 MED ORDER — PROMETHAZINE HCL 25 MG PO TABS: 25.0000 mg | ORAL_TABLET | Freq: Four times a day (QID) | ORAL | Status: DC | PRN

## 2015-10-21 MED ORDER — FENTANYL CITRATE (PF) 100 MCG/2ML IJ SOLN
INTRAMUSCULAR | Status: AC
  Filled 2015-10-21: qty 2

## 2015-10-21 MED ORDER — HYDROCODONE-ACETAMINOPHEN 5-325 MG PO TABS
2.0000 | ORAL_TABLET | Freq: Four times a day (QID) | ORAL | Status: DC | PRN
  Administered 2015-10-21: 2 via ORAL
  Filled 2015-10-21: qty 2

## 2015-10-21 MED ORDER — SENNOSIDES-DOCUSATE SODIUM 8.6-50 MG PO TABS: 3.0000 | ORAL_TABLET | Freq: Every evening | ORAL | Status: DC | PRN

## 2015-10-21 MED ORDER — POTASSIUM CHLORIDE 20 MEQ PO PACK
10.0000 meq | PACK | Freq: Every day | ORAL | Status: DC
Start: 2015-10-21 — End: 2015-10-22
  Administered 2015-10-21: 10 meq via ORAL
  Filled 2015-10-21 (×2): qty 1

## 2015-10-21 MED ORDER — SODIUM CHLORIDE 0.9% FLUSH: 3.0000 mL | Freq: Two times a day (BID) | INTRAVENOUS | Status: DC

## 2015-10-21 MED ORDER — CLOPIDOGREL BISULFATE 75 MG PO TABS
ORAL_TABLET | ORAL | Status: DC | PRN
  Administered 2015-10-21: 225 mg via ORAL

## 2015-10-21 MED ORDER — METOPROLOL SUCCINATE ER 25 MG PO TB24
25.0000 mg | ORAL_TABLET | Freq: Every day | ORAL | Status: DC
  Administered 2015-10-22: 25 mg via ORAL
  Filled 2015-10-21: qty 1

## 2015-10-21 MED ORDER — NITROGLYCERIN 5 MG/ML IV SOLN
INTRAVENOUS | Status: AC
  Filled 2015-10-21: qty 10

## 2015-10-21 MED ORDER — CLOPIDOGREL BISULFATE 75 MG PO TABS
ORAL_TABLET | ORAL | Status: AC
  Filled 2015-10-21: qty 3

## 2015-10-21 MED ORDER — IOPAMIDOL (ISOVUE-300) INJECTION 61%
INTRAVENOUS | Status: DC | PRN
Start: 2015-10-21 — End: 2015-10-21
  Administered 2015-10-21: 110 mL via INTRAVENOUS

## 2015-10-21 MED ORDER — ASPIRIN EC 325 MG PO TBEC
325.0000 mg | DELAYED_RELEASE_TABLET | Freq: Every day | ORAL | Status: DC
  Administered 2015-10-21 – 2015-10-22 (×2): 325 mg via ORAL
  Filled 2015-10-21 (×2): qty 1

## 2015-10-21 MED ORDER — CLOPIDOGREL BISULFATE 75 MG PO TABS
75.0000 mg | ORAL_TABLET | Freq: Every day | ORAL | Status: DC
  Administered 2015-10-22: 75 mg via ORAL
  Filled 2015-10-21: qty 1

## 2015-10-21 MED ORDER — PNEUMOCOCCAL VAC POLYVALENT 25 MCG/0.5ML IJ INJ
0.5000 mL | INJECTION | INTRAMUSCULAR | Status: AC
  Administered 2015-10-22: 0.5 mL via INTRAMUSCULAR
  Filled 2015-10-21: qty 0.5

## 2015-10-21 MED ORDER — SODIUM CHLORIDE 0.9 % IV SOLN
250.0000 mg | INTRAVENOUS | Status: DC | PRN
  Administered 2015-10-21: 1 mg/kg/h via INTRAVENOUS

## 2015-10-21 MED ORDER — ONDANSETRON HCL 4 MG/2ML IJ SOLN: 4.0000 mg | Freq: Four times a day (QID) | INTRAMUSCULAR | Status: DC | PRN

## 2015-10-21 MED ORDER — POLYSACCHARIDE IRON COMPLEX 150 MG PO CAPS
150.0000 mg | ORAL_CAPSULE | Freq: Every day | ORAL | Status: DC
  Filled 2015-10-21: qty 1

## 2015-10-21 MED ORDER — BIVALIRUDIN 250 MG IV SOLR
INTRAVENOUS | Status: AC
  Filled 2015-10-21: qty 250

## 2015-10-21 MED ORDER — FENTANYL CITRATE (PF) 100 MCG/2ML IJ SOLN
INTRAMUSCULAR | Status: DC | PRN
  Administered 2015-10-21: 12.5 ug via INTRAVENOUS

## 2015-10-21 MED ORDER — SODIUM CHLORIDE 0.9 % WEIGHT BASED INFUSION: 3.0000 mL/kg/h | INTRAVENOUS | Status: DC

## 2015-10-21 MED ORDER — HYDROCODONE-ACETAMINOPHEN 5-325 MG PO TABS
1.0000 | ORAL_TABLET | Freq: Four times a day (QID) | ORAL | Status: DC | PRN
  Administered 2015-10-21 (×2): 1 via ORAL
  Filled 2015-10-21 (×2): qty 1

## 2015-10-21 MED ORDER — BIVALIRUDIN BOLUS VIA INFUSION - CUPID
INTRAVENOUS | Status: DC | PRN
  Administered 2015-10-21: 33.675 mg via INTRAVENOUS

## 2015-10-21 MED ORDER — ISOSORBIDE MONONITRATE ER 60 MG PO TB24
60.0000 mg | ORAL_TABLET | Freq: Every day | ORAL | Status: DC
  Administered 2015-10-21 – 2015-10-22 (×2): 60 mg via ORAL
  Filled 2015-10-21 (×2): qty 1

## 2015-10-21 MED ORDER — NITROGLYCERIN 1 MG/10 ML FOR IR/CATH LAB
INTRA_ARTERIAL | Status: DC | PRN
  Administered 2015-10-21: 300 ug
  Administered 2015-10-21: 200 ug

## 2015-10-21 SURGICAL SUPPLY — 15 items
BALLN TREK RX 2.25X15 (BALLOONS) ×2
BALLOON TREK RX 2.25X15 (BALLOONS) ×1 IMPLANT
CATH INFINITI 5FR JL4 (CATHETERS) ×2 IMPLANT
CATH INFINITI JR4 5F (CATHETERS) ×2 IMPLANT
CATH VISTA GUIDE 6FR JR4 SH (CATHETERS) ×2 IMPLANT
DEVICE CLOSURE MYNXGRIP 6/7F (Vascular Products) ×2 IMPLANT
DEVICE INFLAT 30 PLUS (MISCELLANEOUS) ×2 IMPLANT
KIT MANI 3VAL PERCEP (MISCELLANEOUS) ×2 IMPLANT
NEEDLE PERC 18GX7CM (NEEDLE) ×2 IMPLANT
PACK CARDIAC CATH (CUSTOM PROCEDURE TRAY) ×2 IMPLANT
SHEATH AVANTI 5FR X 11CM (SHEATH) ×2 IMPLANT
SHEATH AVANTI 6FR X 11CM (SHEATH) ×2 IMPLANT
STENT XIENCE ALPINE RX 2.5X15 (Permanent Stent) ×2 IMPLANT
WIRE ASAHI PROWATER 180CM (WIRE) ×2 IMPLANT
WIRE EMERALD 3MM-J .035X150CM (WIRE) ×2 IMPLANT

## 2015-10-21 NOTE — Plan of Care (Signed)
Problem: Phase I Progression Outcomes Goal: Distal pulses equal to baseline Outcome: Progressing Pedal pulses 1 to 2+ . Goal: Vascular site scale level 0 - I Vascular Site Scale Level 0: No bruising/bleeding/hematoma Level I (Mild): Bruising/Ecchymosis, minimal bleeding/ooozing, palpable hematoma < 3 cm Level II (Moderate): Bleeding not affecting hemodynamic parameters, pseudoaneurysm, palpable hematoma > 3 cm Level III (Severe) Bleeding which affects hemodynamic parameters or retroperitoneal hemorrhage  Outcome: Progressing Level 1.  Vascular site unchanged from admit to unit. Small amount of ecchymosis and mild swelling. Minimal hematoma papated Goal: Pain controlled with appropriate interventions Outcome: Progressing Vicodin for pain right groin area effective Goal: Initial discharge plan identified Outcome: Progressing Plans to go home with husband tomorrow.  Discharge instruction started. Pt has had 4 stents now and CABG.  She is easily instructed and knows much of information Goal: Voiding-avoid urinary catheter unless indicated Outcome: Progressing voids without problems on BSC. Goal: Hemodynamically stable Outcome: Progressing VSS

## 2015-10-21 NOTE — Progress Notes (Signed)
Report given to 2A nurse . Prepare for transfer to room 245

## 2015-10-21 NOTE — Progress Notes (Signed)
Pt clinically stable post heart cath with stent placement, had hematoma shortly after rec' D into recovery thus requiring icu placement, no further bleeding after initial bleeding at 0948, distal pulses adeq. Family at bedside, report called to Taylor in ccu 16 with orders and plan reviewed, pt to icu 16

## 2015-10-21 NOTE — Progress Notes (Signed)
Pt transferred to room 245. A&Ox4, VSS, complaints of pain treated with prn medication. Pt oriented to room and unit, and educated on use of call bell, telephone, and safety. Skin assessed and telemetry verified with Marcene Brawn, Therapist, sports. Pt assisted to BR, ambulates well independently. RN will continue to monitor and treat per MD orders. Rachael Fee, RN

## 2015-10-21 NOTE — Discharge Instructions (Signed)

## 2015-10-22 DIAGNOSIS — I2510 Atherosclerotic heart disease of native coronary artery without angina pectoris: Secondary | ICD-10-CM | POA: Diagnosis not present

## 2015-10-22 LAB — BASIC METABOLIC PANEL
ANION GAP: 9 (ref 5–15)
BUN: 19 mg/dL (ref 6–20)
CALCIUM: 8.6 mg/dL — AB (ref 8.9–10.3)
CO2: 18 mmol/L — AB (ref 22–32)
Chloride: 112 mmol/L — ABNORMAL HIGH (ref 101–111)
Creatinine, Ser: 1.26 mg/dL — ABNORMAL HIGH (ref 0.44–1.00)
GFR, EST AFRICAN AMERICAN: 50 mL/min — AB (ref >60)
GFR, EST NON AFRICAN AMERICAN: 43 mL/min — AB (ref >60)
Glucose, Bld: 99 mg/dL (ref 65–99)
Potassium: 3.5 mmol/L (ref 3.5–5.1)
Sodium: 139 mmol/L (ref 135–145)

## 2015-10-22 LAB — CBC
HCT: 30.7 % — ABNORMAL LOW (ref 35.0–47.0)
HEMOGLOBIN: 10.2 g/dL — AB (ref 12.0–16.0)
MCH: 28.1 pg (ref 26.0–34.0)
MCHC: 33.3 g/dL (ref 32.0–36.0)
MCV: 84.3 fL (ref 80.0–100.0)
Platelets: 195 10*3/uL (ref 150–440)
RBC: 3.64 MIL/uL — AB (ref 3.80–5.20)
RDW: 17.4 % — ABNORMAL HIGH (ref 11.5–14.5)
WBC: 5.1 10*3/uL (ref 3.6–11.0)

## 2015-10-22 NOTE — Progress Notes (Signed)
Pt. Discharged to home via wc. Discharge instructions and medication regimen reviewed at bedside with patient and spouse. Both verbalize understanding of instructions and medication regimen. Stent card given to husband yesterday. Patient assessment unchanged from this morning. TELE and IV discontinued per policy.

## 2015-10-22 NOTE — Discharge Summary (Signed)
Physician Discharge Summary  Patient ID: [name] MRN: [id number] DOB/AGE: 1945-05-27 70 y.o.  Admit date: 10/21/2015 Discharge date: 10/22/2015  Primary Discharge Diagnosis Unstable angina Secondary Discharge Diagnosis coronary artery disease  Significant Diagnostic Studies: cardiac graphics: Cardiac catheterization and yes  Consults: None  Hospital Course: The patient underwent elective cardiac catheterization on 10/21/2015 which revealed occluded proximal LAD, occluded proximal left circumflex and high-grade 95% stenosis mid RCA. LIMA graft to the LAD was patent. Saphenous vein bypass graft to ramus branch was occluded. Saphenous vein bypass graft to the first obtuse marginal branch was patent. The patient underwent PCI, receiving a 2.5 x 15 mm Xience Alpine drug-eluting stent to mid right coronary artery with an excellent angiographic result. The patient uncomplicated hospital stay, ambulating without difficulty on the morning of 10/22/2015. The patient was discharged home in stable condition.   Discharge Exam: Blood pressure 120/53, pulse 91, temperature 99 F (37.2 C), temperature source Oral, resp. rate 16, height 5' (1.524 m), weight 46.9 kg (103 lb 6.3 oz), last menstrual period 11/03/1980, SpO2 100 %.   General appearance: alert Head: Normocephalic, without obvious abnormality Eyes: conjunctivae/corneas clear. PERRL, EOM's intact. Fundi benign. Ears: normal TM's and external ear canals both ears Nose: Nares normal. Septum midline. Mucosa normal. No drainage or sinus tenderness. Throat: lips, mucosa, and tongue normal; teeth and gums normal Neck: no adenopathy, no carotid bruit, no JVD, supple, symmetrical, trachea midline and thyroid not enlarged, symmetric, no tenderness/mass/nodules Back: symmetric, no curvature. ROM normal. No CVA tenderness. Resp: clear to auscultation bilaterally Chest wall: no tenderness Cardio: regular rate and rhythm, S1, S2 normal, no  murmur, click, rub or gallop GI: soft, non-tender; bowel sounds normal; no masses,  no organomegaly Extremities: extremities normal, atraumatic, no cyanosis or edema Pulses: 2+ and symmetric Skin: Skin color, texture, turgor normal. No rashes or lesions Labs:   Lab Results  Component Value Date   WBC 5.1 10/22/2015   HGB 10.2* 10/22/2015   HCT 30.7* 10/22/2015   MCV 84.3 10/22/2015   PLT 195 10/22/2015    Recent Labs Lab 10/22/15 0449  NA 139  K 3.5  CL 112*  CO2 18*  BUN 19  CREATININE 1.26*  CALCIUM 8.6*  GLUCOSE 99      Radiology:  EKG: Normal sinus rhythm  FOLLOW UP PLANS AND APPOINTMENTS    Medication List    TAKE these medications        aspirin 325 MG tablet  Take 325 mg by mouth daily with lunch.     clopidogrel 75 MG tablet  Commonly known as:  PLAVIX  TAKE 1 TABLET BY MOUTH EVERY DAY at lunch time     CVS STOOL SOFTENER 50 MG capsule  Generic drug:  docusate sodium  Take 50 mg by mouth daily after lunch.     DEXILANT 60 MG capsule  Generic drug:  dexlansoprazole  Take 60 mg by mouth every morning.     DULCOLAX 5 MG EC tablet  Generic drug:  bisacodyl  Take 40 mg by mouth at bedtime. Takes 8 tablets every night to prevent a bowel obstruction.     furosemide 40 MG tablet  Commonly known as:  LASIX  Take 40 mg by mouth daily with lunch. As needed.     hyoscyamine 0.125 MG SL tablet  Commonly known as:  LEVSIN SL  0.125 mg 2 (two) times daily before lunch and supper.     isosorbide mononitrate 60 MG 24 hr tablet  Commonly known as:  IMDUR  TAKE 1 TABLET BY MOUTH EVERY MORNING     MAGNESIUM CITRATE PO  Take 200 mg by mouth PC lunch.     metoprolol succinate 25 MG 24 hr tablet  Commonly known as:  TOPROL-XL  Take 25 mg by mouth daily with lunch.     nitroGLYCERIN 0.4 MG SL tablet  Commonly known as:  NITROSTAT  Place 0.4 mg under the tongue every 5 (five) minutes as needed.     potassium chloride 10 MEQ CR capsule  Commonly known  as:  MICRO-K  Take 10 mEq by mouth 2 (two) times daily. 2 capsules     promethazine 25 MG tablet  Commonly known as:  PHENERGAN  Take 25 mg by mouth every 8 (eight) hours as needed.     riboflavin 100 MG Tabs tablet  Commonly known as:  VITAMIN B-2  Take 100 mg by mouth daily with lunch.     senna 8.6 MG tablet  Commonly known as:  SENOKOT  Take by mouth.     SUPER PROBIOTIC Caps  Take 1 capsule by mouth daily with lunch.     TYLENOL 500 MG tablet  Generic drug:  acetaminophen  Take 500 mg by mouth every 6 (six) hours as needed for moderate pain.     Vitamin D (Ergocalciferol) 50000 units Caps capsule  Commonly known as:  DRISDOL  Take 50,000 Units by mouth every 14 (fourteen) days.           Follow-up Information    Follow up with Voris Tigert, MD In 1 week.   Specialty:  Cardiology   Contact information:   [facility] [place] [address] [phone]       BRING ALL MEDICATIONS WITH YOU TO FOLLOW UP APPOINTMENTS  Time spent with patient to include physician time: 25 minutes Signed:  Isaias Cowman MD, PhD, [facility] 10/22/2015, 8:14 AM

## 2015-11-10 ENCOUNTER — Other Ambulatory Visit: Payer: Self-pay | Admitting: Nurse Practitioner

## 2015-11-12 ENCOUNTER — Ambulatory Visit: Payer: Medicare Other | Admitting: Urology

## 2015-11-24 ENCOUNTER — Ambulatory Visit
Admission: RE | Admit: 2015-11-24 | Discharge: 2015-11-25 | Disposition: A | Discharge status: Home / Self Care | Payer: Medicare Other | Source: Ambulatory Visit | Attending: Cardiology | Admitting: Cardiology

## 2015-11-24 ENCOUNTER — Encounter
Admission: RE | Disposition: A | Discharge status: Home / Self Care | Payer: Self-pay | Source: Ambulatory Visit | Attending: Cardiology

## 2015-11-24 ENCOUNTER — Encounter: Payer: Self-pay | Admitting: *Deleted

## 2015-11-24 DIAGNOSIS — Z90710 Acquired absence of both cervix and uterus: Secondary | ICD-10-CM | POA: Insufficient documentation

## 2015-11-24 DIAGNOSIS — R0789 Other chest pain: Secondary | ICD-10-CM | POA: Diagnosis present

## 2015-11-24 DIAGNOSIS — K219 Gastro-esophageal reflux disease without esophagitis: Secondary | ICD-10-CM | POA: Insufficient documentation

## 2015-11-24 DIAGNOSIS — Z8262 Family history of osteoporosis: Secondary | ICD-10-CM | POA: Insufficient documentation

## 2015-11-24 DIAGNOSIS — Z7902 Long term (current) use of antithrombotics/antiplatelets: Secondary | ICD-10-CM | POA: Insufficient documentation

## 2015-11-24 DIAGNOSIS — Z880 Allergy status to penicillin: Secondary | ICD-10-CM | POA: Diagnosis not present

## 2015-11-24 DIAGNOSIS — Z8619 Personal history of other infectious and parasitic diseases: Secondary | ICD-10-CM | POA: Diagnosis not present

## 2015-11-24 DIAGNOSIS — Z85828 Personal history of other malignant neoplasm of skin: Secondary | ICD-10-CM | POA: Insufficient documentation

## 2015-11-24 DIAGNOSIS — Z951 Presence of aortocoronary bypass graft: Secondary | ICD-10-CM | POA: Insufficient documentation

## 2015-11-24 DIAGNOSIS — K581 Irritable bowel syndrome with constipation: Secondary | ICD-10-CM | POA: Diagnosis not present

## 2015-11-24 DIAGNOSIS — I25718 Atherosclerosis of autologous vein coronary artery bypass graft(s) with other forms of angina pectoris: Secondary | ICD-10-CM | POA: Insufficient documentation

## 2015-11-24 DIAGNOSIS — I259 Chronic ischemic heart disease, unspecified: Secondary | ICD-10-CM | POA: Diagnosis not present

## 2015-11-24 DIAGNOSIS — I2510 Atherosclerotic heart disease of native coronary artery without angina pectoris: Secondary | ICD-10-CM | POA: Diagnosis not present

## 2015-11-24 DIAGNOSIS — Z79899 Other long term (current) drug therapy: Secondary | ICD-10-CM | POA: Insufficient documentation

## 2015-11-24 DIAGNOSIS — E782 Mixed hyperlipidemia: Secondary | ICD-10-CM | POA: Insufficient documentation

## 2015-11-24 DIAGNOSIS — Z9049 Acquired absence of other specified parts of digestive tract: Secondary | ICD-10-CM | POA: Diagnosis not present

## 2015-11-24 DIAGNOSIS — Z888 Allergy status to other drugs, medicaments and biological substances status: Secondary | ICD-10-CM | POA: Diagnosis not present

## 2015-11-24 DIAGNOSIS — I129 Hypertensive chronic kidney disease with stage 1 through stage 4 chronic kidney disease, or unspecified chronic kidney disease: Secondary | ICD-10-CM | POA: Diagnosis not present

## 2015-11-24 DIAGNOSIS — I341 Nonrheumatic mitral (valve) prolapse: Secondary | ICD-10-CM | POA: Insufficient documentation

## 2015-11-24 DIAGNOSIS — Z8711 Personal history of peptic ulcer disease: Secondary | ICD-10-CM | POA: Insufficient documentation

## 2015-11-24 DIAGNOSIS — Z8249 Family history of ischemic heart disease and other diseases of the circulatory system: Secondary | ICD-10-CM | POA: Insufficient documentation

## 2015-11-24 DIAGNOSIS — I200 Unstable angina: Secondary | ICD-10-CM | POA: Diagnosis present

## 2015-11-24 DIAGNOSIS — E7800 Pure hypercholesterolemia, unspecified: Secondary | ICD-10-CM | POA: Insufficient documentation

## 2015-11-24 DIAGNOSIS — N183 Chronic kidney disease, stage 3 (moderate): Secondary | ICD-10-CM | POA: Diagnosis not present

## 2015-11-24 DIAGNOSIS — Z825 Family history of asthma and other chronic lower respiratory diseases: Secondary | ICD-10-CM | POA: Insufficient documentation

## 2015-11-24 DIAGNOSIS — Z833 Family history of diabetes mellitus: Secondary | ICD-10-CM | POA: Insufficient documentation

## 2015-11-24 DIAGNOSIS — Z9889 Other specified postprocedural states: Secondary | ICD-10-CM | POA: Insufficient documentation

## 2015-11-24 DIAGNOSIS — Z823 Family history of stroke: Secondary | ICD-10-CM | POA: Insufficient documentation

## 2015-11-24 DIAGNOSIS — Z7982 Long term (current) use of aspirin: Secondary | ICD-10-CM | POA: Insufficient documentation

## 2015-11-24 DIAGNOSIS — Z806 Family history of leukemia: Secondary | ICD-10-CM | POA: Insufficient documentation

## 2015-11-24 DIAGNOSIS — Z8042 Family history of malignant neoplasm of prostate: Secondary | ICD-10-CM | POA: Insufficient documentation

## 2015-11-24 HISTORY — PX: CARDIAC CATHETERIZATION: SHX172

## 2015-11-24 LAB — GLUCOSE, CAPILLARY: Glucose-Capillary: 87 mg/dL (ref 65–99)

## 2015-11-24 LAB — MRSA PCR SCREENING: MRSA BY PCR: NEGATIVE

## 2015-11-24 SURGERY — CORONARY STENT INTERVENTION
Anesthesia: Moderate Sedation | Laterality: Left

## 2015-11-24 MED ORDER — PROMETHAZINE HCL 25 MG PO TABS: 25.0000 mg | ORAL_TABLET | Freq: Three times a day (TID) | ORAL | Status: DC | PRN

## 2015-11-24 MED ORDER — BIVALIRUDIN BOLUS VIA INFUSION - CUPID
INTRAVENOUS | Status: DC | PRN
  Administered 2015-11-24: 32.625 mg via INTRAVENOUS

## 2015-11-24 MED ORDER — MIDAZOLAM HCL 2 MG/2ML IJ SOLN
INTRAMUSCULAR | Status: AC
  Filled 2015-11-24: qty 2

## 2015-11-24 MED ORDER — IOPAMIDOL (ISOVUE-300) INJECTION 61%
INTRAVENOUS | Status: DC | PRN
  Administered 2015-11-24: 100 mL via INTRA_ARTERIAL

## 2015-11-24 MED ORDER — SODIUM CHLORIDE 0.9% FLUSH: 3.0000 mL | INTRAVENOUS | Status: DC | PRN

## 2015-11-24 MED ORDER — MORPHINE SULFATE (PF) 2 MG/ML IV SOLN
2.0000 mg | INTRAVENOUS | Status: DC | PRN
  Administered 2015-11-24 – 2015-11-25 (×2): 2 mg via INTRAVENOUS
  Filled 2015-11-24 (×2): qty 1

## 2015-11-24 MED ORDER — MIDAZOLAM HCL 2 MG/2ML IJ SOLN
INTRAMUSCULAR | Status: DC | PRN
  Administered 2015-11-24: 0.5 mg via INTRAVENOUS

## 2015-11-24 MED ORDER — METOPROLOL SUCCINATE ER 25 MG PO TB24: 25.0000 mg | ORAL_TABLET | Freq: Every day | ORAL | Status: DC

## 2015-11-24 MED ORDER — SODIUM CHLORIDE 0.9 % WEIGHT BASED INFUSION
3.0000 mL/kg/h | INTRAVENOUS | Status: DC
  Administered 2015-11-24: 3 mL/kg/h via INTRAVENOUS

## 2015-11-24 MED ORDER — CLOPIDOGREL BISULFATE 75 MG PO TABS
ORAL_TABLET | ORAL | Status: AC
  Filled 2015-11-24: qty 4

## 2015-11-24 MED ORDER — SODIUM CHLORIDE 0.9 % WEIGHT BASED INFUSION: 3.0000 mL/kg/h | INTRAVENOUS | Status: AC

## 2015-11-24 MED ORDER — BIVALIRUDIN 250 MG IV SOLR
INTRAVENOUS | Status: AC
Start: 2015-11-24 — End: 2015-11-24
  Filled 2015-11-24: qty 250

## 2015-11-24 MED ORDER — ISOSORBIDE MONONITRATE ER 60 MG PO TB24: 60.0000 mg | ORAL_TABLET | Freq: Every day | ORAL | Status: DC

## 2015-11-24 MED ORDER — SODIUM CHLORIDE 0.9% FLUSH: 3.0000 mL | Freq: Two times a day (BID) | INTRAVENOUS | Status: DC

## 2015-11-24 MED ORDER — SENNOSIDES-DOCUSATE SODIUM 8.6-50 MG PO TABS
3.0000 | ORAL_TABLET | Freq: Every day | ORAL | Status: DC
  Administered 2015-11-24: 3 via ORAL
  Filled 2015-11-24: qty 3

## 2015-11-24 MED ORDER — OXYCODONE-ACETAMINOPHEN 5-325 MG PO TABS
1.0000 | ORAL_TABLET | ORAL | Status: DC | PRN
  Administered 2015-11-24: 2 via ORAL
  Filled 2015-11-24: qty 2

## 2015-11-24 MED ORDER — ONDANSETRON HCL 4 MG/2ML IJ SOLN: 4.0000 mg | Freq: Four times a day (QID) | INTRAMUSCULAR | Status: DC | PRN

## 2015-11-24 MED ORDER — ASPIRIN 81 MG PO CHEW
CHEWABLE_TABLET | ORAL | Status: AC
  Filled 2015-11-24: qty 3

## 2015-11-24 MED ORDER — ACETAMINOPHEN 325 MG PO TABS
650.0000 mg | ORAL_TABLET | ORAL | Status: DC | PRN
  Administered 2015-11-25: 650 mg via ORAL
  Filled 2015-11-24: qty 2

## 2015-11-24 MED ORDER — SODIUM CHLORIDE 0.9% FLUSH
3.0000 mL | INTRAVENOUS | Status: DC | PRN
  Administered 2015-11-24: 3 mL via INTRAVENOUS
  Filled 2015-11-24: qty 3

## 2015-11-24 MED ORDER — SODIUM CHLORIDE 0.9 % IV SOLN: 250.0000 mL | INTRAVENOUS | Status: DC | PRN

## 2015-11-24 MED ORDER — NITROGLYCERIN 5 MG/ML IV SOLN
INTRAVENOUS | Status: AC
  Filled 2015-11-24: qty 10

## 2015-11-24 MED ORDER — HEPARIN (PORCINE) IN NACL 2-0.9 UNIT/ML-% IJ SOLN
INTRAMUSCULAR | Status: AC
  Filled 2015-11-24: qty 1000

## 2015-11-24 MED ORDER — ASPIRIN 81 MG PO CHEW
CHEWABLE_TABLET | ORAL | Status: AC
  Administered 2015-11-24: 81 mg
  Filled 2015-11-24: qty 1

## 2015-11-24 MED ORDER — SODIUM CHLORIDE 0.9 % WEIGHT BASED INFUSION: 1.0000 mL/kg/h | INTRAVENOUS | Status: DC

## 2015-11-24 MED ORDER — SODIUM CHLORIDE 0.9 % IV SOLN
250.0000 mg | INTRAVENOUS | Status: DC | PRN
  Administered 2015-11-24: 1.75 mg/kg/h via INTRAVENOUS

## 2015-11-24 MED ORDER — HYDROMORPHONE HCL 1 MG/ML IJ SOLN
0.5000 mg | Freq: Once | INTRAMUSCULAR | Status: AC
  Administered 2015-11-24: 0.5 mg via INTRAVENOUS

## 2015-11-24 MED ORDER — SODIUM CHLORIDE 0.9% FLUSH
3.0000 mL | Freq: Two times a day (BID) | INTRAVENOUS | Status: DC
  Administered 2015-11-24 (×2): 3 mL via INTRAVENOUS

## 2015-11-24 MED ORDER — FENTANYL CITRATE (PF) 100 MCG/2ML IJ SOLN
INTRAMUSCULAR | Status: AC
  Filled 2015-11-24: qty 2

## 2015-11-24 MED ORDER — HYDROMORPHONE HCL 1 MG/ML IJ SOLN
INTRAMUSCULAR | Status: AC
  Administered 2015-11-24: 0.5 mg
  Filled 2015-11-24: qty 1

## 2015-11-24 MED ORDER — POTASSIUM CHLORIDE 20 MEQ PO PACK: 20.0000 meq | PACK | Freq: Two times a day (BID) | ORAL | Status: DC

## 2015-11-24 MED ORDER — ASPIRIN 81 MG PO CHEW
243.0000 mg | CHEWABLE_TABLET | Freq: Once | ORAL | Status: AC
  Administered 2015-11-24: 243 mg via ORAL

## 2015-11-24 MED ORDER — FENTANYL CITRATE (PF) 100 MCG/2ML IJ SOLN
INTRAMUSCULAR | Status: DC | PRN
  Administered 2015-11-24: 25 ug via INTRAVENOUS

## 2015-11-24 MED ORDER — CLOPIDOGREL BISULFATE 75 MG PO TABS
300.0000 mg | ORAL_TABLET | Freq: Every day | ORAL | Status: DC
  Administered 2015-11-24: 300 mg via ORAL

## 2015-11-24 MED ORDER — ASPIRIN 81 MG PO CHEW: 81.0000 mg | CHEWABLE_TABLET | ORAL | Status: DC

## 2015-11-24 MED ORDER — POTASSIUM CHLORIDE ER 10 MEQ PO CPCR
20.0000 meq | ORAL_CAPSULE | Freq: Two times a day (BID) | ORAL | Status: DC
  Administered 2015-11-24: 20 meq via ORAL
  Filled 2015-11-24: qty 2

## 2015-11-24 MED ORDER — FUROSEMIDE 40 MG PO TABS: 40.0000 mg | ORAL_TABLET | Freq: Every day | ORAL | Status: DC

## 2015-11-24 SURGICAL SUPPLY — 15 items
BALLN TREK RX 2.5X12 (BALLOONS) ×2
BALLOON TREK RX 2.5X12 (BALLOONS) ×1 IMPLANT
CATH INFINITI 5FR JL4 (CATHETERS) ×2 IMPLANT
CATH INFINITI JR4 5F (CATHETERS) ×2 IMPLANT
CATH VISTA GUIDE 6FR XB3.5 SH (CATHETERS) ×2 IMPLANT
CATH VISTA GUIDE 6FRXBLAD3.5SH (CATHETERS) ×2 IMPLANT
DEVICE CLOSURE MYNXGRIP 6/7F (Vascular Products) ×2 IMPLANT
DEVICE INFLAT 30 PLUS (MISCELLANEOUS) ×2 IMPLANT
KIT MANI 3VAL PERCEP (MISCELLANEOUS) ×2 IMPLANT
NEEDLE PERC 18GX7CM (NEEDLE) ×2 IMPLANT
PACK CARDIAC CATH (CUSTOM PROCEDURE TRAY) ×2 IMPLANT
SHEATH AVANTI 6FR X 11CM (SHEATH) ×2 IMPLANT
STENT RESOLUTE INTEG 2.75X12 (Permanent Stent) ×2 IMPLANT
WIRE ASAHI PROWATER 180CM (WIRE) ×2 IMPLANT
WIRE EMERALD 3MM-J .035X150CM (WIRE) ×2 IMPLANT

## 2015-11-24 NOTE — Progress Notes (Addendum)
Picked up patient at 1600 from [place].  SBP runs low in 90 at home, can be in 80s in hospital.  Adult small cuff in place on right arm.  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  No visible bleeding when air is withdrawn and reinstilled, site is not visible under PAD d/t Mynx clusure label in place. No bruise at site. Pulse in right foot auscultated with doppler, pulse is marked.  Pt reports no pain at this time.  Up to [facility] PRN

## 2015-11-25 DIAGNOSIS — I259 Chronic ischemic heart disease, unspecified: Secondary | ICD-10-CM | POA: Diagnosis not present

## 2015-11-25 LAB — BASIC METABOLIC PANEL
Anion gap: 5 (ref 5–15)
BUN: 20 mg/dL (ref 6–20)
CALCIUM: 8.5 mg/dL — AB (ref 8.9–10.3)
CO2: 22 mmol/L (ref 22–32)
CREATININE: 1.34 mg/dL — AB (ref 0.44–1.00)
Chloride: 111 mmol/L (ref 101–111)
GFR calc non Af Amer: 40 mL/min — ABNORMAL LOW (ref >60)
GFR, EST AFRICAN AMERICAN: 46 mL/min — AB (ref >60)
GLUCOSE: 106 mg/dL — AB (ref 65–99)
Potassium: 3.4 mmol/L — ABNORMAL LOW (ref 3.5–5.1)
Sodium: 138 mmol/L (ref 135–145)

## 2015-11-25 LAB — CBC
HCT: 29.6 % — ABNORMAL LOW (ref 35.0–47.0)
Hemoglobin: 10.1 g/dL — ABNORMAL LOW (ref 12.0–16.0)
MCH: 29.5 pg (ref 26.0–34.0)
MCHC: 34.1 g/dL (ref 32.0–36.0)
MCV: 86.5 fL (ref 80.0–100.0)
PLATELETS: 200 10*3/uL (ref 150–440)
RBC: 3.42 MIL/uL — ABNORMAL LOW (ref 3.80–5.20)
RDW: 16.8 % — AB (ref 11.5–14.5)
WBC: 7.1 10*3/uL (ref 3.6–11.0)

## 2015-11-25 MED ORDER — CLOPIDOGREL BISULFATE 75 MG PO TABS: 75.0000 mg | ORAL_TABLET | Freq: Every day | ORAL | Status: DC

## 2015-11-25 MED ORDER — ASPIRIN 81 MG PO CHEW: 81.0000 mg | CHEWABLE_TABLET | Freq: Every day | ORAL | Status: DC

## 2015-11-25 NOTE — Progress Notes (Signed)
Dr. Saralyn Pilar rounding on floor now. Aware of potassium level, no new orders. Aware of duplicate orders for plavix - orders that patient should be on the 75mg  dose instead of the 300mg  dose.

## 2015-11-25 NOTE — Discharge Summary (Signed)
Physician Discharge Summary  Patient ID: [name] MRN: [id number] DOB/AGE: Aug 08, 1945 70 y.o.  Admit date: 11/24/2015 Discharge date: 11/25/2015  Primary Discharge Diagnosis Unstable angina Secondary Discharge Diagnosis coronary artery disease  Significant Diagnostic Studies: yes  Consults: None  Hospital Course: The patient underwent elective PCI. She received a Resolute drug-eluting stent in the ostium of the left main with a good angiographic result. There were no procedural complications. She was transferred to telemetry where she had an uncomplicated hospital course without recurrent chest pain. She was ambulating without difficulty and was discharged home in stable condition.   Discharge Exam: Blood pressure 111/44, pulse 88, temperature 99.3 F (37.4 C), temperature source Oral, resp. rate 18, height 5' (1.524 m), weight 46.63 kg (102 lb 12.8 oz), last menstrual period 11/03/1980, SpO2 99 %.   General appearance: alert Head: atraumatic Eyes: conjunctivae/corneas clear. PERRL, EOM's intact. Fundi benign. Ears: normal TM's and external ear canals both ears Nose: Nares normal. Septum midline. Mucosa normal. No drainage or sinus tenderness. Throat: lips, mucosa, and tongue normal; teeth and gums normal Neck: no adenopathy, no carotid bruit, no JVD, supple, symmetrical, trachea midline and thyroid not enlarged, symmetric, no tenderness/mass/nodules Back: symmetric, no curvature. ROM normal. No CVA tenderness. Resp: clear to auscultation bilaterally Chest wall: no tenderness Cardio: regular rate and rhythm, S1, S2 normal, no murmur, click, rub or gallop GI: soft, non-tender; bowel sounds normal; no masses,  no organomegaly Extremities: extremities normal, atraumatic, no cyanosis or edema Pulses: 2+ and symmetric Skin: Skin color, texture, turgor normal. No rashes or lesions Neurologic: Grossly normal Labs:   Lab Results  Component Value Date   WBC 7.1 11/25/2015    HGB 10.1* 11/25/2015   HCT 29.6* 11/25/2015   MCV 86.5 11/25/2015   PLT 200 11/25/2015    Recent Labs Lab 11/25/15 0338  NA 138  K 3.4*  CL 111  CO2 22  BUN 20  CREATININE 1.34*  CALCIUM 8.5*  GLUCOSE 106*      Radiology:  EKG: Normal sinus rhythm  FOLLOW UP PLANS AND APPOINTMENTS    Medication List    TAKE these medications        aspirin 325 MG tablet  Take 325 mg by mouth daily with lunch.     clopidogrel 75 MG tablet  Commonly known as:  PLAVIX  TAKE 1 TABLET BY MOUTH EVERY DAY at lunch time     clopidogrel 75 MG tablet  Commonly known as:  PLAVIX  Take 1 tablet (75 mg total) by mouth daily with breakfast.     CVS STOOL SOFTENER 50 MG capsule  Generic drug:  docusate sodium  Take 50 mg by mouth daily after lunch.     DEXILANT 60 MG capsule  Generic drug:  dexlansoprazole  Take 60 mg by mouth every morning.     DULCOLAX 5 MG EC tablet  Generic drug:  bisacodyl  Take 40 mg by mouth at bedtime. Takes 8 tablets every night to prevent a bowel obstruction.     furosemide 40 MG tablet  Commonly known as:  LASIX  Take 40 mg by mouth daily with lunch. As needed.     hyoscyamine 0.125 MG SL tablet  Commonly known as:  LEVSIN SL  0.125 mg 2 (two) times daily before lunch and supper.     isosorbide mononitrate 60 MG 24 hr tablet  Commonly known as:  IMDUR  TAKE 1 TABLET BY MOUTH EVERY MORNING     MAGNESIUM CITRATE  PO  Take 200 mg by mouth PC lunch.     metoprolol succinate 25 MG 24 hr tablet  Commonly known as:  TOPROL-XL  Take 25 mg by mouth daily with lunch.     nitroGLYCERIN 0.4 MG SL tablet  Commonly known as:  NITROSTAT  Place 0.4 mg under the tongue every 5 (five) minutes as needed.     potassium chloride 10 MEQ CR capsule  Commonly known as:  MICRO-K  Take 10 mEq by mouth 2 (two) times daily. 2 capsules     promethazine 25 MG tablet  Commonly known as:  PHENERGAN  Take 25 mg by mouth every 8 (eight) hours as needed.     riboflavin  100 MG Tabs tablet  Commonly known as:  VITAMIN B-2  Take 100 mg by mouth daily with lunch.     senna 8.6 MG tablet  Commonly known as:  SENOKOT  Take 1 tablet by mouth daily.     SUPER PROBIOTIC Caps  Take 1 capsule by mouth daily with lunch.     TYLENOL 500 MG tablet  Generic drug:  acetaminophen  Take 500 mg by mouth every 6 (six) hours as needed for moderate pain.           Follow-up Information    Follow up with Durrell Barajas, MD In 1 week.   Specialty:  Cardiology   Contact information:   [facility] [place] [address] [phone]       BRING ALL MEDICATIONS WITH YOU TO FOLLOW UP APPOINTMENTS  Time spent with patient to include physician time: 25 minutes Signed:  Isaias Cowman MD, PhD, [facility] 11/25/2015, 7:43 AM

## 2015-11-25 NOTE — Progress Notes (Signed)
Pt transferred to 2-A.  Report called to [place].  Pt left floor via wheelchair with all personal belongings.

## 2015-11-25 NOTE — Progress Notes (Signed)
Patient given discharge teaching and paperwork regarding medications, diet, follow-up appointments and activity. Patient understanding verbalized. IV and telemetry discontinued prior to leaving. Skin assessment as previously charted and vitals are stable; on room air. Patient being discharged to home. Family present during discharge teaching. Seen by Care Management. Prescription for plavix given to patient. Education regarding cath/vascular site care given. Patient refused morning meds today, said she would rather take them at home.

## 2015-11-25 NOTE — Progress Notes (Signed)
PAD removed, no signs of bleeding/hematoma.

## 2015-11-25 NOTE — Care Management Obs Status (Deleted)
[place]   Patient Details  Name: [name] MRN: [id number] Date of Birth: 03-Nov-1945   Medicare Observation Status Notification Given:  Yes    Jolly Mango, RN 11/25/2015, 9:05 AM

## 2015-11-25 NOTE — Progress Notes (Signed)
Patient transferred from CCU to 2A Room 255. Tele box #[id number] verified with Lexi M and CCMD tech. Skin assessment completed with Lexi RN and post-cath site to right groin clean, dry, and intact with Mynx closure device. A&Ox4, VSS, safety plan reviewed and signed. Nursing staff will continue to monitor. Earleen Reaper, RN

## 2015-12-15 ENCOUNTER — Other Ambulatory Visit: Payer: Self-pay | Admitting: *Deleted

## 2015-12-15 DIAGNOSIS — D649 Anemia, unspecified: Secondary | ICD-10-CM

## 2015-12-21 ENCOUNTER — Inpatient Hospital Stay: Payer: Medicare Other

## 2015-12-21 ENCOUNTER — Inpatient Hospital Stay: Payer: Medicare Other | Attending: Oncology | Admitting: Oncology

## 2015-12-21 VITALS — BP 136/72 | HR 120 | Temp 99.5°F | Resp 18 | Wt 95.3 lb

## 2015-12-21 DIAGNOSIS — K219 Gastro-esophageal reflux disease without esophagitis: Secondary | ICD-10-CM | POA: Insufficient documentation

## 2015-12-21 DIAGNOSIS — K589 Irritable bowel syndrome without diarrhea: Secondary | ICD-10-CM | POA: Insufficient documentation

## 2015-12-21 DIAGNOSIS — N183 Chronic kidney disease, stage 3 (moderate): Secondary | ICD-10-CM | POA: Insufficient documentation

## 2015-12-21 DIAGNOSIS — E785 Hyperlipidemia, unspecified: Secondary | ICD-10-CM | POA: Diagnosis not present

## 2015-12-21 DIAGNOSIS — D509 Iron deficiency anemia, unspecified: Secondary | ICD-10-CM | POA: Insufficient documentation

## 2015-12-21 DIAGNOSIS — I200 Unstable angina: Secondary | ICD-10-CM | POA: Diagnosis not present

## 2015-12-21 DIAGNOSIS — Z79899 Other long term (current) drug therapy: Secondary | ICD-10-CM

## 2015-12-21 DIAGNOSIS — D649 Anemia, unspecified: Secondary | ICD-10-CM

## 2015-12-21 DIAGNOSIS — I10 Essential (primary) hypertension: Secondary | ICD-10-CM | POA: Insufficient documentation

## 2015-12-21 DIAGNOSIS — R55 Syncope and collapse: Secondary | ICD-10-CM | POA: Diagnosis not present

## 2015-12-21 DIAGNOSIS — I2510 Atherosclerotic heart disease of native coronary artery without angina pectoris: Secondary | ICD-10-CM | POA: Diagnosis not present

## 2015-12-21 LAB — CBC WITH DIFFERENTIAL/PLATELET
Basophils Absolute: 0.1 10*3/uL (ref 0–0.1)
Basophils Relative: 1 %
EOS PCT: 2 %
Eosinophils Absolute: 0.1 10*3/uL (ref 0–0.7)
HEMATOCRIT: 34.2 % — AB (ref 35.0–47.0)
Hemoglobin: 11.8 g/dL — ABNORMAL LOW (ref 12.0–16.0)
LYMPHS PCT: 14 %
Lymphs Abs: 0.9 10*3/uL — ABNORMAL LOW (ref 1.0–3.6)
MCH: 30.2 pg (ref 26.0–34.0)
MCHC: 34.6 g/dL (ref 32.0–36.0)
MCV: 87.1 fL (ref 80.0–100.0)
MONO ABS: 0.7 10*3/uL (ref 0.2–0.9)
MONOS PCT: 10 %
NEUTROS ABS: 4.8 10*3/uL (ref 1.4–6.5)
Neutrophils Relative %: 73 %
Platelets: 326 10*3/uL (ref 150–440)
RBC: 3.92 MIL/uL (ref 3.80–5.20)
RDW: 14.6 % — AB (ref 11.5–14.5)
WBC: 6.6 10*3/uL (ref 3.6–11.0)

## 2015-12-21 LAB — IRON AND TIBC
Iron: 85 ug/dL (ref 28–170)
Saturation Ratios: 42 % — ABNORMAL HIGH (ref 10.4–31.8)
TIBC: 202 ug/dL — AB (ref 250–450)
UIBC: 117 ug/dL

## 2015-12-21 LAB — FERRITIN: Ferritin: 417 ng/mL — ABNORMAL HIGH (ref 11–307)

## 2015-12-21 NOTE — Progress Notes (Signed)
[name]  Telephone:(336) [phone] Fax:(336) [phone]  ID: [name] OB: September 04, 1945  MR#: [id number]  [id number]  Patient Care Team: Jasmine Harrier, MD as PCP - General (Internal Medicine)  CHIEF COMPLAINT: Iron deficiency anemia.  INTERVAL HISTORY: Patient returns to clinic today for repeat laboratory work and further evaluation. Patient was admitted to the hospital approximately one month ago with unstable angina and required 2 cardiac stents. She reports a "blackout" last week which she believes is secondary to a decreased blood pressure secondary to metoprolol.  She currently feels well and is back to her baseline. She does not complain of weakness or fatigue today. She has no recent fevers or illnesses. She denies any neurologic complaints. She has a good appetite and denies weight loss. She has no further chest pain or shortness of breath. She denies any nausea, vomiting, constipation, or diarrhea. She has no melena or hematochezia. Patient otherwise feels well and offers no further specific complaints.  REVIEW OF SYSTEMS:   Review of Systems  Constitutional: Negative for fever, malaise/fatigue and weight loss.  Respiratory: Negative for cough and shortness of breath.   Cardiovascular: Negative.  Negative for chest pain and leg swelling.  Gastrointestinal: Negative.  Negative for abdominal pain, blood in stool and melena.  Genitourinary: Negative.  Negative for hematuria.  Neurological: Negative.  Negative for weakness.  Psychiatric/Behavioral: Negative.     As per HPI. Otherwise, a complete review of systems is negatve.  PAST MEDICAL HISTORY: Past Medical History:  Diagnosis Date  . Acid reflux   . Anemia   . CAD (coronary artery disease)   . Chronic constipation   . Chronic kidney disease    stage 3 chronic kidney disease  . Eczema   . H/O heart artery stent   . Hyperlipidemia   . Hypertension   . IBS (irritable bowel syndrome) 2004   . Migraine   . Mitral valve prolapse   . Peptic ulcer 1989  . Skin cancer     PAST SURGICAL HISTORY: Past Surgical History:  Procedure Laterality Date  . ABDOMINAL HYSTERECTOMY    . CARDIAC CATHETERIZATION N/A 10/21/2015   Procedure: Left Heart Cath and Coronary Angiography;  Surgeon: Jasmine Cowman, MD;  Location: [place] [facility];  Service: Cardiovascular;  Laterality: N/A;  . CARDIAC CATHETERIZATION N/A 10/21/2015   Procedure: Coronary Stent Intervention;  Surgeon: Jasmine Cowman, MD;  Location: [place] [facility];  Service: Cardiovascular;  Laterality: N/A;  . CARDIAC CATHETERIZATION Left 11/24/2015   Procedure: Coronary Stent Intervention;  Surgeon: Jasmine Cowman, MD;  Location: [place] [facility];  Service: Cardiovascular;  Laterality: Left;  . CARDIAC SURGERY  2007   cardiac stent place  . CATARACT EXTRACTION  2012  . CHOLECYSTECTOMY    . CORONARY ANGIOPLASTY WITH STENT PLACEMENT  2013  . CORONARY ARTERY BYPASS GRAFT     triple  . CYSTOSCOPY W/ RETROGRADES Bilateral 12/29/2014   Procedure: CYSTOSCOPY WITH RETROGRADE PYELOGRAM;  Surgeon: Jasmine Flowers, MD;  Location: [facility];  Service: Urology;  Laterality: Bilateral;  . CYSTOSCOPY WITH BIOPSY N/A 12/29/2014   Procedure: CYSTOSCOPY WITH BIOPSY;  Surgeon: Jasmine Flowers, MD;  Location: [facility];  Service: Urology;  Laterality: N/A;  . HAND SURGERY  1998  . SIGMOID RESECTION / RECTOPEXY  2006  . SKIN CANCER EXCISION  2013  . TENDON REPAIR     right elbow  . TRIGGER FINGER RELEASE      FAMILY HISTORY Family History  Problem Relation  Age of Onset  . Kidney Stones Father   . Kidney Stones Brother   . Prostate cancer Father   . Heart disease Father   . Heart disease Mother        ADVANCED DIRECTIVES:    HEALTH MAINTENANCE: Social History  Substance Use Topics  . Smoking status: Never Smoker  . Smokeless tobacco: Not on file  . Alcohol use No     Colonoscopy:  PAP:  Bone  density:  Lipid panel:  Allergies  Allergen Reactions  . Prochlorperazine Nausea And Vomiting    Severe vomiting  . Ezetimibe     Other reaction(s): Muscle Pain  . Statins     Other reaction(s): Muscle Pain  . Tape Other (See Comments)    Pulls my skin off when the tape is removed. Use paper tape only.  . Cephalexin Rash  . Metoclopramide Rash    Elevates BP, face draws to one side  . Penicillin G Rash  . Penicillins Rash    Has patient had a PCN reaction causing immediate rash, facial/tongue/throat swelling, SOB or lightheadedness with hypotension: Yes Has patient had a PCN reaction causing severe rash involving mucus membranes or skin necrosis: Yes Has patient had a PCN reaction that required hospitalization No Has patient had a PCN reaction occurring within the last 10 years: No If all of the above answers are "NO", then may proceed with Cephalosporin use.     Current Outpatient Prescriptions  Medication Sig Dispense Refill  . acetaminophen (TYLENOL) 500 MG tablet Take 500 mg by mouth every 6 (six) hours as needed for moderate pain.     Marland Kitchen aspirin 325 MG tablet Take 325 mg by mouth daily with lunch.     . bisacodyl (DULCOLAX) 5 MG EC tablet Take 40 mg by mouth at bedtime. Takes 8 tablets every night to prevent a bowel obstruction.    . clopidogrel (PLAVIX) 75 MG tablet TAKE 1 TABLET BY MOUTH EVERY DAY at lunch time    . dexlansoprazole (DEXILANT) 60 MG capsule Take 60 mg by mouth every morning.     . docusate sodium ([employer] STOOL SOFTENER) 50 MG capsule Take 50 mg by mouth daily after lunch.     . furosemide (LASIX) 40 MG tablet Take 40 mg by mouth daily with lunch. As needed.    . hyoscyamine (LEVSIN SL) 0.125 MG SL tablet 0.125 mg 2 (two) times daily before lunch and supper.     . isosorbide mononitrate (IMDUR) 60 MG 24 hr tablet TAKE 1 TABLET BY MOUTH EVERY MORNING    . MAGNESIUM CITRATE PO Take 200 mg by mouth PC lunch.     . metoprolol succinate (TOPROL-XL) 25 MG 24 hr  tablet Take 25 mg by mouth daily with lunch.     . nitroGLYCERIN (NITROSTAT) 0.4 MG SL tablet Place 0.4 mg under the tongue every 5 (five) minutes as needed.     . potassium chloride (MICRO-K) 10 MEQ CR capsule Take 10 mEq by mouth 2 (two) times daily. 2 capsules    . Probiotic Product (SUPER PROBIOTIC) CAPS Take 1 capsule by mouth daily with lunch.     . promethazine (PHENERGAN) 25 MG tablet Take 25 mg by mouth every 8 (eight) hours as needed.     . riboflavin (VITAMIN B-2) 100 MG TABS tablet Take 100 mg by mouth daily with lunch.     . senna (SENOKOT) 8.6 MG tablet Take 1 tablet by mouth daily.  No current facility-administered medications for this visit.     OBJECTIVE: Vitals:   12/21/15 1350  BP: 136/72  Pulse: (!) 120  Resp: 18  Temp: 99.5 F (37.5 C)     Body mass index is 18.62 kg/m.    ECOG FS:0 - Asymptomatic  General: Well-developed, well-nourished, no acute distress. Eyes: Pink conjunctiva, anicteric sclera. Lungs: Clear to auscultation bilaterally. Heart: Regular rate and rhythm. No rubs, murmurs, or gallops. Abdomen: Soft, nontender, nondistended. No organomegaly noted, normoactive bowel sounds. Musculoskeletal: No edema, cyanosis, or clubbing. Neuro: Alert, answering all questions appropriately. Cranial nerves grossly intact. Skin: No rashes or petechiae noted. Psych: Normal affect.   LAB RESULTS:  Lab Results  Component Value Date   NA 138 11/25/2015   K 3.4 (L) 11/25/2015   CL 111 11/25/2015   CO2 22 11/25/2015   GLUCOSE 106 (H) 11/25/2015   BUN 20 11/25/2015   CREATININE 1.34 (H) 11/25/2015   CALCIUM 8.5 (L) 11/25/2015   PROT 7.0 11/30/2006   ALBUMIN 4.4 11/30/2006   AST 19 11/30/2006   ALT 11 11/30/2006   ALKPHOS 51 11/30/2006   BILITOT 0.7 11/30/2006   GFRNONAA 40 (L) 11/25/2015   GFRAA 46 (L) 11/25/2015    Lab Results  Component Value Date   WBC 6.6 12/21/2015   NEUTROABS 4.8 12/21/2015   HGB 11.8 (L) 12/21/2015   HCT 34.2 (L)  12/21/2015   MCV 87.1 12/21/2015   PLT 326 12/21/2015   Lab Results  Component Value Date   IRON 85 12/21/2015   TIBC 202 (L) 12/21/2015   IRONPCTSAT 42 (H) 12/21/2015    Lab Results  Component Value Date   FERRITIN 417 (H) 12/21/2015     STUDIES: No results found.  ASSESSMENT:  Iron deficiency anemia.  PLAN:   1. Iron deficiency anemia: Patient's hemoglobin is 11.8 and her iron stores are within normal limits. Previously, the remainder of her laboratory work is either negative or within normal limits. No intervention is needed at this time. Return to clinic in 4 months with repeat laboratory work, further evaluation, consideration of additional IV Feraheme.  2. Unstable angina, cardiac stenting: Continue treatment and evaluation per cardiology. 3. "Blackouts": Patient reports she had a significantly decreased blood pressure possibly secondary to her blood pressure medications.  She was instructed to call her cardiologist for further evaluation.  Patient expressed understanding and was in agreement with this plan. She also understands that She can call clinic at any time with any questions, concerns, or complaints.     Lloyd Huger, MD   12/21/2015 2:41 PM

## 2015-12-21 NOTE — Progress Notes (Signed)
States since last visit has had 2 stents placed in heart. Last Friday blacked-out twice and fell. Pt states BP was low at home and remains low. Recently recovering from bronchitis and finished last antibiotic this morning. Has low-grade temp of 99.5 today.

## 2016-02-16 DIAGNOSIS — R55 Syncope and collapse: Secondary | ICD-10-CM | POA: Insufficient documentation

## 2016-04-20 NOTE — Progress Notes (Signed)
[place]  Telephone:(336) [phone] Fax:(336) [phone]  ID: [name] OB: 08-06-1945  MR#: [id number]  AOZ#:[id number]  Patient Care Team: Tracie Harrier, MD as PCP - General (Internal Medicine)  CHIEF COMPLAINT: Iron deficiency anemia.  INTERVAL HISTORY: Patient returns to clinic today for repeat laboratory work and further evaluation. She has had no further cardiac issues are repeat of her "blackouts". She currently feels well and is asymptomatic. She does not complain of weakness or fatigue today. She has no recent fevers or illnesses. She denies any neurologic complaints. She has a good appetite and denies weight loss. She has no chest pain or shortness of breath. She denies any nausea, vomiting, constipation, or diarrhea. She has no melena or hematochezia. Patient offers no specific complaints today.  REVIEW OF SYSTEMS:   Review of Systems  Constitutional: Negative for fever, malaise/fatigue and weight loss.  Respiratory: Negative for cough and shortness of breath.   Cardiovascular: Negative.  Negative for chest pain and leg swelling.  Gastrointestinal: Negative.  Negative for abdominal pain, blood in stool and melena.  Genitourinary: Negative.  Negative for hematuria.  Musculoskeletal: Negative.   Neurological: Negative.  Negative for weakness.  Psychiatric/Behavioral: Negative.  The patient is not nervous/anxious.     As per HPI. Otherwise, a complete review of systems is negative.  PAST MEDICAL HISTORY: Past Medical History:  Diagnosis Date  . Acid reflux   . Anemia   . CAD (coronary artery disease)   . Chronic constipation   . Chronic kidney disease    stage 3 chronic kidney disease  . Eczema   . H/O heart artery stent   . Hyperlipidemia   . Hypertension   . IBS (irritable bowel syndrome) 2004  . Migraine   . Mitral valve prolapse   . Peptic ulcer 1989  . Skin cancer     PAST SURGICAL HISTORY: Past Surgical History:  Procedure  Laterality Date  . ABDOMINAL HYSTERECTOMY    . CARDIAC CATHETERIZATION N/A 10/21/2015   Procedure: Left Heart Cath and Coronary Angiography;  Surgeon: Isaias Cowman, MD;  Location: [place] [facility];  Service: Cardiovascular;  Laterality: N/A;  . CARDIAC CATHETERIZATION N/A 10/21/2015   Procedure: Coronary Stent Intervention;  Surgeon: Isaias Cowman, MD;  Location: [place] [facility];  Service: Cardiovascular;  Laterality: N/A;  . CARDIAC CATHETERIZATION Left 11/24/2015   Procedure: Coronary Stent Intervention;  Surgeon: Isaias Cowman, MD;  Location: [place] [facility];  Service: Cardiovascular;  Laterality: Left;  . CARDIAC SURGERY  2007   cardiac stent place  . CATARACT EXTRACTION  2012  . CHOLECYSTECTOMY    . CORONARY ANGIOPLASTY WITH STENT PLACEMENT  2013  . CORONARY ARTERY BYPASS GRAFT     triple  . CYSTOSCOPY W/ RETROGRADES Bilateral 12/29/2014   Procedure: CYSTOSCOPY WITH RETROGRADE PYELOGRAM;  Surgeon: Collier Flowers, MD;  Location: [facility];  Service: Urology;  Laterality: Bilateral;  . CYSTOSCOPY WITH BIOPSY N/A 12/29/2014   Procedure: CYSTOSCOPY WITH BIOPSY;  Surgeon: Collier Flowers, MD;  Location: [facility];  Service: Urology;  Laterality: N/A;  . HAND SURGERY  1998  . SIGMOID RESECTION / RECTOPEXY  2006  . SKIN CANCER EXCISION  2013  . TENDON REPAIR     right elbow  . TRIGGER FINGER RELEASE      FAMILY HISTORY Family History  Problem Relation Age of Onset  . Kidney Stones Father   . Kidney Stones Brother   . Prostate cancer Father   . Heart  disease Father   . Heart disease Mother        ADVANCED DIRECTIVES:    HEALTH MAINTENANCE: Social History  Substance Use Topics  . Smoking status: Never Smoker  . Smokeless tobacco: Not on file  . Alcohol use No     Colonoscopy:  PAP:  Bone density:  Lipid panel:  Allergies  Allergen Reactions  . Prochlorperazine Nausea And Vomiting    Severe vomiting  . Ezetimibe     Other reaction(s):  Muscle Pain  . Statins     Other reaction(s): Muscle Pain  . Tape Other (See Comments)    Pulls my skin off when the tape is removed. Use paper tape only.  . Cephalexin Rash  . Metoclopramide Rash    Elevates BP, face draws to one side  . Penicillin G Rash  . Penicillins Rash    Has patient had a PCN reaction causing immediate rash, facial/tongue/throat swelling, SOB or lightheadedness with hypotension: Yes Has patient had a PCN reaction causing severe rash involving mucus membranes or skin necrosis: Yes Has patient had a PCN reaction that required hospitalization No Has patient had a PCN reaction occurring within the last 10 years: No If all of the above answers are "NO", then may proceed with Cephalosporin use.     Current Outpatient Prescriptions  Medication Sig Dispense Refill  . acetaminophen (TYLENOL) 500 MG tablet Take 500 mg by mouth every 6 (six) hours as needed for moderate pain.     Marland Kitchen aspirin 325 MG tablet Take 325 mg by mouth daily with lunch.     . bisacodyl (DULCOLAX) 5 MG EC tablet Take 40 mg by mouth at bedtime. Takes 8 tablets every night to prevent a bowel obstruction.    . clopidogrel (PLAVIX) 75 MG tablet TAKE 1 TABLET BY MOUTH EVERY DAY at lunch time    . dexlansoprazole (DEXILANT) 60 MG capsule Take 60 mg by mouth every morning.     Marland Kitchen dexlansoprazole (DEXILANT) 60 MG capsule Take by mouth.    . docusate sodium ([employer] STOOL SOFTENER) 50 MG capsule Take 50 mg by mouth daily after lunch.     . furosemide (LASIX) 20 MG tablet Take 20 mg by mouth daily.    . hyoscyamine (LEVSIN SL) 0.125 MG SL tablet 0.125 mg 2 (two) times daily before lunch and supper.     . isosorbide mononitrate (IMDUR) 60 MG 24 hr tablet TAKE 1 TABLET BY MOUTH EVERY MORNING    . MAGNESIUM CITRATE PO Take 200 mg by mouth PC lunch.     . metoprolol succinate (TOPROL-XL) 25 MG 24 hr tablet Take 25 mg by mouth daily with lunch.     . nitroGLYCERIN (NITROSTAT) 0.4 MG SL tablet Place 0.4 mg under the  tongue every 5 (five) minutes as needed.     . potassium chloride (MICRO-K) 10 MEQ CR capsule Take 10 mEq by mouth 2 (two) times daily. 2 capsules    . promethazine (PHENERGAN) 25 MG tablet Take 25 mg by mouth every 8 (eight) hours as needed.     . riboflavin (VITAMIN B-2) 100 MG TABS tablet Take 100 mg by mouth daily with lunch.     . senna (SENOKOT) 8.6 MG tablet Take 1 tablet by mouth daily.      No current facility-administered medications for this visit.     OBJECTIVE: Vitals:   04/21/16 1419  BP: 96/64  Pulse: 70  Resp: 18  Temp: (!) 94 F (34.4 C)  Body mass index is 19.01 kg/m.    ECOG FS:0 - Asymptomatic  General: Well-developed, well-nourished, no acute distress. Eyes: Pink conjunctiva, anicteric sclera. Lungs: Clear to auscultation bilaterally. Heart: Regular rate and rhythm. No rubs, murmurs, or gallops. Abdomen: Soft, nontender, nondistended. No organomegaly noted, normoactive bowel sounds. Musculoskeletal: No edema, cyanosis, or clubbing. Neuro: Alert, answering all questions appropriately. Cranial nerves grossly intact. Skin: No rashes or petechiae noted. Psych: Normal affect.   LAB RESULTS:  Lab Results  Component Value Date   NA 138 11/25/2015   K 3.4 (L) 11/25/2015   CL 111 11/25/2015   CO2 22 11/25/2015   GLUCOSE 106 (H) 11/25/2015   BUN 20 11/25/2015   CREATININE 1.34 (H) 11/25/2015   CALCIUM 8.5 (L) 11/25/2015   PROT 7.0 11/30/2006   ALBUMIN 4.4 11/30/2006   AST 19 11/30/2006   ALT 11 11/30/2006   ALKPHOS 51 11/30/2006   BILITOT 0.7 11/30/2006   GFRNONAA 40 (L) 11/25/2015   GFRAA 46 (L) 11/25/2015    Lab Results  Component Value Date   WBC 7.1 04/21/2016   NEUTROABS 4.8 04/21/2016   HGB 12.1 04/21/2016   HCT 35.9 04/21/2016   MCV 86.1 04/21/2016   PLT 305 04/21/2016   Lab Results  Component Value Date   IRON 86 04/21/2016   TIBC 264 04/21/2016   IRONPCTSAT 33 (H) 04/21/2016    Lab Results  Component Value Date   FERRITIN  164 04/21/2016     STUDIES: No results found.  ASSESSMENT:  Iron deficiency anemia.  PLAN:   1. Iron deficiency anemia: Patient's hemoglobin and her iron stores continue to be within normal limits. Previously, the remainder of her laboratory work was either negative or within normal limits. No intervention is needed at this time. Patient last received IV iron in May 2017. Return to clinic in 4 months with repeat laboratory work, further evaluation, consideration of additional IV Feraheme.  2. Unstable angina, cardiac stenting: Resolved. Continue treatment and evaluation per cardiology. 3. "Blackouts": Resolved.  Patient expressed understanding and was in agreement with this plan. She also understands that She can call clinic at any time with any questions, concerns, or complaints.     Lloyd Huger, MD   04/24/2016 8:58 AM

## 2016-04-21 ENCOUNTER — Inpatient Hospital Stay: Payer: Medicare Other

## 2016-04-21 ENCOUNTER — Inpatient Hospital Stay (HOSPITAL_BASED_OUTPATIENT_CLINIC_OR_DEPARTMENT_OTHER): Payer: Medicare Other | Admitting: Oncology

## 2016-04-21 ENCOUNTER — Inpatient Hospital Stay: Payer: Medicare Other | Attending: Oncology

## 2016-04-21 VITALS — BP 96/64 | HR 70 | Temp 94.0°F | Resp 18 | Wt 97.3 lb

## 2016-04-21 DIAGNOSIS — I341 Nonrheumatic mitral (valve) prolapse: Secondary | ICD-10-CM | POA: Insufficient documentation

## 2016-04-21 DIAGNOSIS — Z955 Presence of coronary angioplasty implant and graft: Secondary | ICD-10-CM | POA: Diagnosis not present

## 2016-04-21 DIAGNOSIS — I2510 Atherosclerotic heart disease of native coronary artery without angina pectoris: Secondary | ICD-10-CM | POA: Diagnosis not present

## 2016-04-21 DIAGNOSIS — Z79899 Other long term (current) drug therapy: Secondary | ICD-10-CM | POA: Insufficient documentation

## 2016-04-21 DIAGNOSIS — I129 Hypertensive chronic kidney disease with stage 1 through stage 4 chronic kidney disease, or unspecified chronic kidney disease: Secondary | ICD-10-CM | POA: Diagnosis not present

## 2016-04-21 DIAGNOSIS — D509 Iron deficiency anemia, unspecified: Secondary | ICD-10-CM

## 2016-04-21 DIAGNOSIS — K5909 Other constipation: Secondary | ICD-10-CM | POA: Insufficient documentation

## 2016-04-21 DIAGNOSIS — K589 Irritable bowel syndrome without diarrhea: Secondary | ICD-10-CM | POA: Diagnosis not present

## 2016-04-21 DIAGNOSIS — Z85828 Personal history of other malignant neoplasm of skin: Secondary | ICD-10-CM | POA: Diagnosis not present

## 2016-04-21 DIAGNOSIS — K219 Gastro-esophageal reflux disease without esophagitis: Secondary | ICD-10-CM | POA: Diagnosis not present

## 2016-04-21 DIAGNOSIS — E785 Hyperlipidemia, unspecified: Secondary | ICD-10-CM | POA: Insufficient documentation

## 2016-04-21 DIAGNOSIS — N183 Chronic kidney disease, stage 3 (moderate): Secondary | ICD-10-CM | POA: Insufficient documentation

## 2016-04-21 DIAGNOSIS — Z8711 Personal history of peptic ulcer disease: Secondary | ICD-10-CM | POA: Diagnosis not present

## 2016-04-21 DIAGNOSIS — Z7982 Long term (current) use of aspirin: Secondary | ICD-10-CM | POA: Insufficient documentation

## 2016-04-21 DIAGNOSIS — D508 Other iron deficiency anemias: Secondary | ICD-10-CM

## 2016-04-21 LAB — CBC WITH DIFFERENTIAL/PLATELET
BASOS ABS: 0.1 10*3/uL (ref 0–0.1)
Basophils Relative: 1 %
EOS PCT: 2 %
Eosinophils Absolute: 0.1 10*3/uL (ref 0–0.7)
HCT: 35.9 % (ref 35.0–47.0)
Hemoglobin: 12.1 g/dL (ref 12.0–16.0)
LYMPHS PCT: 21 %
Lymphs Abs: 1.5 10*3/uL (ref 1.0–3.6)
MCH: 29 pg (ref 26.0–34.0)
MCHC: 33.7 g/dL (ref 32.0–36.0)
MCV: 86.1 fL (ref 80.0–100.0)
MONO ABS: 0.6 10*3/uL (ref 0.2–0.9)
MONOS PCT: 8 %
Neutro Abs: 4.8 10*3/uL (ref 1.4–6.5)
Neutrophils Relative %: 68 %
PLATELETS: 305 10*3/uL (ref 150–440)
RBC: 4.16 MIL/uL (ref 3.80–5.20)
RDW: 13.1 % (ref 11.5–14.5)
WBC: 7.1 10*3/uL (ref 3.6–11.0)

## 2016-04-21 LAB — IRON AND TIBC
IRON: 86 ug/dL (ref 28–170)
SATURATION RATIOS: 33 % — AB (ref 10.4–31.8)
TIBC: 264 ug/dL (ref 250–450)
UIBC: 178 ug/dL

## 2016-04-21 LAB — FERRITIN: FERRITIN: 164 ng/mL (ref 11–307)

## 2016-04-21 NOTE — Progress Notes (Signed)
Here for follow up. Gained wt w boost taken daily. Mood bright

## 2016-08-19 ENCOUNTER — Other Ambulatory Visit: Payer: Self-pay | Admitting: Oncology

## 2016-08-22 ENCOUNTER — Inpatient Hospital Stay: Payer: Medicare Other

## 2016-08-22 ENCOUNTER — Inpatient Hospital Stay: Payer: Medicare Other | Attending: Oncology

## 2016-08-22 ENCOUNTER — Encounter: Payer: Self-pay | Admitting: Oncology

## 2016-08-22 ENCOUNTER — Inpatient Hospital Stay (HOSPITAL_BASED_OUTPATIENT_CLINIC_OR_DEPARTMENT_OTHER): Payer: Medicare Other | Admitting: Oncology

## 2016-08-22 VITALS — BP 110/71 | HR 74 | Temp 98.3°F | Resp 18 | Wt 98.4 lb

## 2016-08-22 DIAGNOSIS — I341 Nonrheumatic mitral (valve) prolapse: Secondary | ICD-10-CM | POA: Diagnosis not present

## 2016-08-22 DIAGNOSIS — N183 Chronic kidney disease, stage 3 (moderate): Secondary | ICD-10-CM | POA: Insufficient documentation

## 2016-08-22 DIAGNOSIS — Z951 Presence of aortocoronary bypass graft: Secondary | ICD-10-CM | POA: Diagnosis not present

## 2016-08-22 DIAGNOSIS — K589 Irritable bowel syndrome without diarrhea: Secondary | ICD-10-CM | POA: Insufficient documentation

## 2016-08-22 DIAGNOSIS — I2510 Atherosclerotic heart disease of native coronary artery without angina pectoris: Secondary | ICD-10-CM | POA: Diagnosis not present

## 2016-08-22 DIAGNOSIS — Z79899 Other long term (current) drug therapy: Secondary | ICD-10-CM | POA: Diagnosis not present

## 2016-08-22 DIAGNOSIS — K219 Gastro-esophageal reflux disease without esophagitis: Secondary | ICD-10-CM | POA: Diagnosis not present

## 2016-08-22 DIAGNOSIS — Z955 Presence of coronary angioplasty implant and graft: Secondary | ICD-10-CM | POA: Diagnosis not present

## 2016-08-22 DIAGNOSIS — I129 Hypertensive chronic kidney disease with stage 1 through stage 4 chronic kidney disease, or unspecified chronic kidney disease: Secondary | ICD-10-CM | POA: Insufficient documentation

## 2016-08-22 DIAGNOSIS — D509 Iron deficiency anemia, unspecified: Secondary | ICD-10-CM | POA: Insufficient documentation

## 2016-08-22 DIAGNOSIS — Z7982 Long term (current) use of aspirin: Secondary | ICD-10-CM | POA: Insufficient documentation

## 2016-08-22 DIAGNOSIS — D508 Other iron deficiency anemias: Secondary | ICD-10-CM

## 2016-08-22 DIAGNOSIS — E785 Hyperlipidemia, unspecified: Secondary | ICD-10-CM | POA: Insufficient documentation

## 2016-08-22 DIAGNOSIS — Z8711 Personal history of peptic ulcer disease: Secondary | ICD-10-CM | POA: Insufficient documentation

## 2016-08-22 DIAGNOSIS — K5909 Other constipation: Secondary | ICD-10-CM | POA: Insufficient documentation

## 2016-08-22 LAB — CBC WITH DIFFERENTIAL/PLATELET
BASOS PCT: 1 %
Basophils Absolute: 0.1 10*3/uL (ref 0–0.1)
EOS ABS: 0.3 10*3/uL (ref 0–0.7)
Eosinophils Relative: 4 %
HEMATOCRIT: 34.8 % — AB (ref 35.0–47.0)
Hemoglobin: 11.9 g/dL — ABNORMAL LOW (ref 12.0–16.0)
Lymphocytes Relative: 18 %
Lymphs Abs: 1.1 10*3/uL (ref 1.0–3.6)
MCH: 29.9 pg (ref 26.0–34.0)
MCHC: 34 g/dL (ref 32.0–36.0)
MCV: 87.9 fL (ref 80.0–100.0)
MONO ABS: 0.5 10*3/uL (ref 0.2–0.9)
MONOS PCT: 8 %
Neutro Abs: 4.2 10*3/uL (ref 1.4–6.5)
Neutrophils Relative %: 69 %
Platelets: 361 10*3/uL (ref 150–440)
RBC: 3.96 MIL/uL (ref 3.80–5.20)
RDW: 14.9 % — AB (ref 11.5–14.5)
WBC: 6.1 10*3/uL (ref 3.6–11.0)

## 2016-08-22 LAB — FERRITIN: FERRITIN: 198 ng/mL (ref 11–307)

## 2016-08-22 LAB — IRON AND TIBC
IRON: 73 ug/dL (ref 28–170)
Saturation Ratios: 22 % (ref 10.4–31.8)
TIBC: 336 ug/dL (ref 250–450)
UIBC: 263 ug/dL

## 2016-08-22 NOTE — Progress Notes (Signed)
Offers no complaints  

## 2016-08-22 NOTE — Progress Notes (Signed)
[place]  [phone] [phone]  ID: Ellene Route OB: 10/28/1945  MR#: [id number]  PJK#:[id number]  Patient Care Team: Tracie Harrier, MD as PCP - General (Internal Medicine)  CHIEF COMPLAINT: Iron deficiency anemia.  INTERVAL HISTORY: Patient returns to clinic today for repeat laboratory work and further evaluation. She currently feels well and is asymptomatic. She does not complain of weakness or fatigue today. She denies any recent fevers or illnesses. She has no neurologic complaints. She has a good appetite and denies weight loss. She denies any chest pain or shortness of breath. She denies any nausea, vomiting, constipation, or diarrhea. She has no melena or hematochezia. Patient offers no specific complaints today.  REVIEW OF SYSTEMS:   Review of Systems  Constitutional: Negative for fever, malaise/fatigue and weight loss.  Respiratory: Negative for cough and shortness of breath.   Cardiovascular: Negative.  Negative for chest pain and leg swelling.  Gastrointestinal: Negative.  Negative for abdominal pain, blood in stool and melena.  Genitourinary: Negative.  Negative for hematuria.  Musculoskeletal: Negative.   Neurological: Negative.  Negative for weakness.  Psychiatric/Behavioral: Negative.  The patient is not nervous/anxious.     As per HPI. Otherwise, a complete review of systems is negative.  PAST MEDICAL HISTORY: Past Medical History:  Diagnosis Date  . Acid reflux   . Anemia   . CAD (coronary artery disease)   . Chronic constipation   . Chronic kidney disease    stage 3 chronic kidney disease  . Eczema   . H/O heart artery stent   . Hyperlipidemia   . Hypertension   . IBS (irritable bowel syndrome) 2004  . Migraine   . Mitral valve prolapse   . Peptic ulcer 1989  . Skin cancer     PAST SURGICAL HISTORY: Past Surgical History:  Procedure Laterality Date  . ABDOMINAL HYSTERECTOMY    . CARDIAC  CATHETERIZATION N/A 10/21/2015   Procedure: Left Heart Cath and Coronary Angiography;  Surgeon: Isaias Cowman, MD;  Location: [facility];  Service: Cardiovascular;  Laterality: N/A;  . CARDIAC CATHETERIZATION N/A 10/21/2015   Procedure: Coronary Stent Intervention;  Surgeon: Isaias Cowman, MD;  Location: [facility];  Service: Cardiovascular;  Laterality: N/A;  . CARDIAC CATHETERIZATION Left 11/24/2015   Procedure: Coronary Stent Intervention;  Surgeon: Isaias Cowman, MD;  Location: [facility];  Service: Cardiovascular;  Laterality: Left;  . CARDIAC SURGERY  2007   cardiac stent place  . CATARACT EXTRACTION  2012  . CHOLECYSTECTOMY    . CORONARY ANGIOPLASTY WITH STENT PLACEMENT  2013  . CORONARY ARTERY BYPASS GRAFT     triple  . CYSTOSCOPY W/ RETROGRADES Bilateral 12/29/2014   Procedure: CYSTOSCOPY WITH RETROGRADE PYELOGRAM;  Surgeon: Collier Flowers, MD;  Location: [facility];  Service: Urology;  Laterality: Bilateral;  . CYSTOSCOPY WITH BIOPSY N/A 12/29/2014   Procedure: CYSTOSCOPY WITH BIOPSY;  Surgeon: Collier Flowers, MD;  Location: [facility];  Service: Urology;  Laterality: N/A;  . HAND SURGERY  1998  . SIGMOID RESECTION / RECTOPEXY  2006  . SKIN CANCER EXCISION  2013  . TENDON REPAIR     right elbow  . TRIGGER FINGER RELEASE      FAMILY HISTORY Family History  Problem Relation Age of Onset  . Kidney Stones Father   . Prostate cancer Father   . Heart disease Father   . Kidney Stones Brother   . Heart disease Mother  ADVANCED DIRECTIVES:    HEALTH MAINTENANCE: Social History  Substance Use Topics  . Smoking status: Never Smoker  . Smokeless tobacco: Not on file  . Alcohol use No     Colonoscopy:  PAP:  Bone density:  Lipid panel:  Allergies  Allergen Reactions  . Prochlorperazine Nausea And Vomiting    Severe vomiting  . Ezetimibe     Other reaction(s): Muscle Pain  . Statins     Other reaction(s): Muscle Pain    . Tape Other (See Comments)    Pulls my skin off when the tape is removed. Use paper tape only.  . Cephalexin Rash  . Metoclopramide Rash    Elevates BP, face draws to one side  . Penicillin G Rash  . Penicillins Rash    Has patient had a PCN reaction causing immediate rash, facial/tongue/throat swelling, SOB or lightheadedness with hypotension: Yes Has patient had a PCN reaction causing severe rash involving mucus membranes or skin necrosis: Yes Has patient had a PCN reaction that required hospitalization No Has patient had a PCN reaction occurring within the last 10 years: No If all of the above answers are "NO", then may proceed with Cephalosporin use.     Current Outpatient Prescriptions  Medication Sig Dispense Refill  . acetaminophen (TYLENOL) 500 MG tablet Take 500 mg by mouth every 6 (six) hours as needed for moderate pain.     Marland Kitchen allopurinol (ZYLOPRIM) 100 MG tablet Take 1 tablet by mouth daily.    Marland Kitchen aspirin 325 MG tablet Take 325 mg by mouth daily with lunch.     . bisacodyl (DULCOLAX) 5 MG EC tablet Take 40 mg by mouth at bedtime. Takes 8 tablets every night to prevent a bowel obstruction.    . clopidogrel (PLAVIX) 75 MG tablet TAKE 1 TABLET BY MOUTH EVERY DAY at lunch time    . dexlansoprazole (DEXILANT) 60 MG capsule Take 60 mg by mouth every morning.     . docusate sodium ([employer] STOOL SOFTENER) 50 MG capsule Take 50 mg by mouth daily after lunch.     . furosemide (LASIX) 20 MG tablet Take 20 mg by mouth daily.    Marland Kitchen gemfibrozil (LOPID) 600 MG tablet Take 1 tablet by mouth 2 (two) times daily.    . hyoscyamine (LEVSIN SL) 0.125 MG SL tablet 0.125 mg 2 (two) times daily before lunch and supper.     . isosorbide mononitrate (IMDUR) 60 MG 24 hr tablet TAKE 1 TABLET BY MOUTH EVERY MORNING    . MAGNESIUM CITRATE PO Take 200 mg by mouth PC lunch.     . metoprolol succinate (TOPROL-XL) 25 MG 24 hr tablet Take 25 mg by mouth daily with lunch.     . nitroGLYCERIN (NITROSTAT) 0.4 MG  SL tablet Place 0.4 mg under the tongue every 5 (five) minutes as needed.     . potassium chloride (MICRO-K) 10 MEQ CR capsule Take 10 mEq by mouth 2 (two) times daily. 2 capsules    . promethazine (PHENERGAN) 25 MG tablet Take 25 mg by mouth every 8 (eight) hours as needed.     . riboflavin (VITAMIN B-2) 100 MG TABS tablet Take 100 mg by mouth daily with lunch.     . senna (SENOKOT) 8.6 MG tablet Take 1 tablet by mouth daily.      No current facility-administered medications for this visit.     OBJECTIVE: Vitals:   08/22/16 1417  BP: 110/71  Pulse: 74  Resp: 18  Temp: 98.3 F (36.8 C)     Body mass index is 19.22 kg/m.    ECOG FS:0 - Asymptomatic  General: Well-developed, well-nourished, no acute distress. Eyes: Pink conjunctiva, anicteric sclera. Lungs: Clear to auscultation bilaterally. Heart: Regular rate and rhythm. No rubs, murmurs, or gallops. Abdomen: Soft, nontender, nondistended. No organomegaly noted, normoactive bowel sounds. Musculoskeletal: No edema, cyanosis, or clubbing. Neuro: Alert, answering all questions appropriately. Cranial nerves grossly intact. Skin: No rashes or petechiae noted. Psych: Normal affect.   LAB RESULTS:  Lab Results  Component Value Date   NA 138 11/25/2015   K 3.4 (L) 11/25/2015   CL 111 11/25/2015   CO2 22 11/25/2015   GLUCOSE 106 (H) 11/25/2015   BUN 20 11/25/2015   CREATININE 1.34 (H) 11/25/2015   CALCIUM 8.5 (L) 11/25/2015   PROT 7.0 11/30/2006   ALBUMIN 4.4 11/30/2006   AST 19 11/30/2006   ALT 11 11/30/2006   ALKPHOS 51 11/30/2006   BILITOT 0.7 11/30/2006   GFRNONAA 40 (L) 11/25/2015   GFRAA 46 (L) 11/25/2015    Lab Results  Component Value Date   WBC 6.1 08/22/2016   NEUTROABS 4.2 08/22/2016   HGB 11.9 (L) 08/22/2016   HCT 34.8 (L) 08/22/2016   MCV 87.9 08/22/2016   PLT 361 08/22/2016   Lab Results  Component Value Date   IRON 73 08/22/2016   TIBC 336 08/22/2016   IRONPCTSAT 22 08/22/2016    Lab Results   Component Value Date   FERRITIN 198 08/22/2016     STUDIES: No results found.  ASSESSMENT:  Iron deficiency anemia.  PLAN:   1. Iron deficiency anemia: Patient's hemoglobin and her iron stores continue to be within normal limits. Previously, the remainder of her laboratory work was either negative or within normal limits. No intervention is needed at this time. Patient last received IV iron in May 2017. After lengthy discussion with the patient, who is agreed upon that no further follow-up is necessary. Please refer patient back if her hemoglobin trends down and she becomes symptomatic. No follow-up has been scheduled.  2. Unstable angina, cardiac stenting: Resolved. Continue treatment and evaluation per cardiology.  Patient expressed understanding and was in agreement with this plan. She also understands that She can call clinic at any time with any questions, concerns, or complaints.    Lloyd Huger, MD   08/28/2016 8:03 AM

## 2016-09-05 ENCOUNTER — Other Ambulatory Visit: Payer: Self-pay | Admitting: Internal Medicine

## 2016-09-05 DIAGNOSIS — G8929 Other chronic pain: Secondary | ICD-10-CM

## 2016-09-05 DIAGNOSIS — M545 Low back pain: Principal | ICD-10-CM

## 2016-09-19 ENCOUNTER — Ambulatory Visit
Admission: RE | Admit: 2016-09-19 | Discharge: 2016-09-19 | Disposition: A | Discharge status: Home / Self Care | Payer: Medicare Other | Source: Ambulatory Visit | Attending: Internal Medicine | Admitting: Internal Medicine

## 2016-09-19 DIAGNOSIS — G8929 Other chronic pain: Secondary | ICD-10-CM

## 2016-09-19 DIAGNOSIS — M48061 Spinal stenosis, lumbar region without neurogenic claudication: Secondary | ICD-10-CM | POA: Diagnosis not present

## 2016-09-19 DIAGNOSIS — M545 Low back pain: Secondary | ICD-10-CM | POA: Diagnosis present

## 2016-09-19 DIAGNOSIS — M4317 Spondylolisthesis, lumbosacral region: Secondary | ICD-10-CM | POA: Diagnosis not present

## 2017-01-03 ENCOUNTER — Other Ambulatory Visit: Payer: Self-pay | Admitting: Internal Medicine

## 2017-01-03 DIAGNOSIS — Z1231 Encounter for screening mammogram for malignant neoplasm of breast: Secondary | ICD-10-CM

## 2017-01-25 ENCOUNTER — Other Ambulatory Visit: Payer: Self-pay | Admitting: Internal Medicine

## 2017-01-25 ENCOUNTER — Ambulatory Visit
Admission: RE | Admit: 2017-01-25 | Discharge: 2017-01-25 | Disposition: A | Discharge status: Home / Self Care | Payer: Medicare Other | Source: Ambulatory Visit | Attending: Internal Medicine | Admitting: Internal Medicine

## 2017-01-25 DIAGNOSIS — Z1231 Encounter for screening mammogram for malignant neoplasm of breast: Secondary | ICD-10-CM | POA: Diagnosis not present

## 2017-07-04 ENCOUNTER — Other Ambulatory Visit: Payer: Self-pay

## 2017-07-04 ENCOUNTER — Encounter
Admission: RE | Disposition: A | Discharge status: Home / Self Care | Payer: Self-pay | Source: Ambulatory Visit | Attending: Cardiology

## 2017-07-04 ENCOUNTER — Encounter: Payer: Self-pay | Admitting: Emergency Medicine

## 2017-07-04 ENCOUNTER — Observation Stay
Admission: RE | Admit: 2017-07-04 | Discharge: 2017-07-05 | Disposition: A | Discharge status: Home / Self Care | Payer: Medicare Other | Source: Ambulatory Visit | Attending: Cardiology | Admitting: Cardiology

## 2017-07-04 DIAGNOSIS — Z7982 Long term (current) use of aspirin: Secondary | ICD-10-CM | POA: Diagnosis not present

## 2017-07-04 DIAGNOSIS — G43909 Migraine, unspecified, not intractable, without status migrainosus: Secondary | ICD-10-CM | POA: Diagnosis not present

## 2017-07-04 DIAGNOSIS — K219 Gastro-esophageal reflux disease without esophagitis: Secondary | ICD-10-CM | POA: Insufficient documentation

## 2017-07-04 DIAGNOSIS — I2582 Chronic total occlusion of coronary artery: Secondary | ICD-10-CM | POA: Diagnosis not present

## 2017-07-04 DIAGNOSIS — T82855A Stenosis of coronary artery stent, initial encounter: Secondary | ICD-10-CM | POA: Insufficient documentation

## 2017-07-04 DIAGNOSIS — I341 Nonrheumatic mitral (valve) prolapse: Secondary | ICD-10-CM | POA: Insufficient documentation

## 2017-07-04 DIAGNOSIS — Y831 Surgical operation with implant of artificial internal device as the cause of abnormal reaction of the patient, or of later complication, without mention of misadventure at the time of the procedure: Secondary | ICD-10-CM | POA: Insufficient documentation

## 2017-07-04 DIAGNOSIS — E782 Mixed hyperlipidemia: Secondary | ICD-10-CM | POA: Diagnosis not present

## 2017-07-04 DIAGNOSIS — I129 Hypertensive chronic kidney disease with stage 1 through stage 4 chronic kidney disease, or unspecified chronic kidney disease: Secondary | ICD-10-CM | POA: Insufficient documentation

## 2017-07-04 DIAGNOSIS — Z7902 Long term (current) use of antithrombotics/antiplatelets: Secondary | ICD-10-CM | POA: Diagnosis not present

## 2017-07-04 DIAGNOSIS — I25810 Atherosclerosis of coronary artery bypass graft(s) without angina pectoris: Secondary | ICD-10-CM | POA: Diagnosis not present

## 2017-07-04 DIAGNOSIS — R079 Chest pain, unspecified: Secondary | ICD-10-CM | POA: Diagnosis not present

## 2017-07-04 DIAGNOSIS — I2510 Atherosclerotic heart disease of native coronary artery without angina pectoris: Secondary | ICD-10-CM | POA: Diagnosis present

## 2017-07-04 DIAGNOSIS — N183 Chronic kidney disease, stage 3 (moderate): Secondary | ICD-10-CM | POA: Insufficient documentation

## 2017-07-04 DIAGNOSIS — Z8249 Family history of ischemic heart disease and other diseases of the circulatory system: Secondary | ICD-10-CM | POA: Diagnosis not present

## 2017-07-04 HISTORY — PX: LEFT HEART CATH AND CORS/GRAFTS ANGIOGRAPHY: CATH118250

## 2017-07-04 HISTORY — PX: CORONARY STENT INTERVENTION: CATH118234

## 2017-07-04 LAB — POCT ACTIVATED CLOTTING TIME: ACTIVATED CLOTTING TIME: 301 s

## 2017-07-04 SURGERY — LEFT HEART CATH AND CORS/GRAFTS ANGIOGRAPHY
Anesthesia: Moderate Sedation

## 2017-07-04 MED ORDER — ASPIRIN 81 MG PO CHEW
CHEWABLE_TABLET | ORAL | Status: AC
  Filled 2017-07-04: qty 3

## 2017-07-04 MED ORDER — SODIUM CHLORIDE 0.9 % WEIGHT BASED INFUSION
1.0000 mL/kg/h | INTRAVENOUS | Status: AC
  Administered 2017-07-04: 1 mL/kg/h via INTRAVENOUS

## 2017-07-04 MED ORDER — MIDAZOLAM HCL 2 MG/2ML IJ SOLN
INTRAMUSCULAR | Status: DC | PRN
  Administered 2017-07-04: 0.5 mg via INTRAVENOUS

## 2017-07-04 MED ORDER — ASPIRIN 81 MG PO CHEW
81.0000 mg | CHEWABLE_TABLET | Freq: Every day | ORAL | Status: DC
  Administered 2017-07-05: 81 mg via ORAL
  Filled 2017-07-04: qty 1

## 2017-07-04 MED ORDER — SODIUM CHLORIDE 0.9% FLUSH: 3.0000 mL | INTRAVENOUS | Status: DC | PRN

## 2017-07-04 MED ORDER — FENTANYL CITRATE (PF) 100 MCG/2ML IJ SOLN
INTRAMUSCULAR | Status: DC | PRN
  Administered 2017-07-04: 25 ug via INTRAVENOUS

## 2017-07-04 MED ORDER — BIVALIRUDIN TRIFLUOROACETATE 250 MG IV SOLR
INTRAVENOUS | Status: AC
  Filled 2017-07-04: qty 250

## 2017-07-04 MED ORDER — SODIUM CHLORIDE 0.9 % IV SOLN
INTRAVENOUS | Status: DC
  Administered 2017-07-04: 08:00:00 via INTRAVENOUS

## 2017-07-04 MED ORDER — ALLOPURINOL 100 MG PO TABS
100.0000 mg | ORAL_TABLET | Freq: Every day | ORAL | Status: DC
  Administered 2017-07-04 – 2017-07-05 (×2): 100 mg via ORAL
  Filled 2017-07-04 (×2): qty 1

## 2017-07-04 MED ORDER — CLOPIDOGREL BISULFATE 75 MG PO TABS
75.0000 mg | ORAL_TABLET | Freq: Every day | ORAL | Status: DC
  Administered 2017-07-05: 75 mg via ORAL
  Filled 2017-07-04: qty 1

## 2017-07-04 MED ORDER — MIDAZOLAM HCL 2 MG/2ML IJ SOLN
INTRAMUSCULAR | Status: AC
  Filled 2017-07-04: qty 2

## 2017-07-04 MED ORDER — ASPIRIN 81 MG PO CHEW
CHEWABLE_TABLET | ORAL | Status: AC
  Administered 2017-07-04: 81 mg via ORAL
  Filled 2017-07-04: qty 1

## 2017-07-04 MED ORDER — POTASSIUM CHLORIDE CRYS ER 20 MEQ PO TBCR
40.0000 meq | EXTENDED_RELEASE_TABLET | Freq: Every day | ORAL | Status: DC
  Administered 2017-07-04: 40 meq via ORAL
  Filled 2017-07-04 (×2): qty 2

## 2017-07-04 MED ORDER — FUROSEMIDE 40 MG PO TABS
40.0000 mg | ORAL_TABLET | Freq: Every day | ORAL | Status: DC
  Administered 2017-07-04 – 2017-07-05 (×2): 40 mg via ORAL
  Filled 2017-07-04 (×2): qty 1

## 2017-07-04 MED ORDER — PANTOPRAZOLE SODIUM 40 MG PO TBEC
40.0000 mg | DELAYED_RELEASE_TABLET | Freq: Every day | ORAL | Status: DC
  Administered 2017-07-04 – 2017-07-05 (×2): 40 mg via ORAL
  Filled 2017-07-04 (×2): qty 1

## 2017-07-04 MED ORDER — METOPROLOL SUCCINATE ER 50 MG PO TB24
25.0000 mg | ORAL_TABLET | Freq: Every day | ORAL | Status: DC
  Administered 2017-07-04: 25 mg via ORAL
  Filled 2017-07-04 (×2): qty 1

## 2017-07-04 MED ORDER — SODIUM CHLORIDE 0.9 % IV SOLN
INTRAVENOUS | Status: DC | PRN
  Administered 2017-07-04: 1.75 mg/kg/h via INTRAVENOUS

## 2017-07-04 MED ORDER — IOPAMIDOL (ISOVUE-300) INJECTION 61%
INTRAVENOUS | Status: DC | PRN
  Administered 2017-07-04: 205 mL via INTRA_ARTERIAL

## 2017-07-04 MED ORDER — TRAMADOL HCL 50 MG PO TABS
50.0000 mg | ORAL_TABLET | Freq: Four times a day (QID) | ORAL | Status: DC | PRN
  Administered 2017-07-04: 50 mg via ORAL
  Filled 2017-07-04 (×2): qty 1

## 2017-07-04 MED ORDER — SODIUM CHLORIDE 0.9% FLUSH
3.0000 mL | Freq: Two times a day (BID) | INTRAVENOUS | Status: DC
  Administered 2017-07-04: 3 mL via INTRAVENOUS

## 2017-07-04 MED ORDER — FENTANYL CITRATE (PF) 100 MCG/2ML IJ SOLN
INTRAMUSCULAR | Status: AC
  Filled 2017-07-04: qty 2

## 2017-07-04 MED ORDER — HEPARIN (PORCINE) IN NACL 2-0.9 UNIT/ML-% IJ SOLN
INTRAMUSCULAR | Status: AC
  Filled 2017-07-04: qty 1000

## 2017-07-04 MED ORDER — ISOSORBIDE MONONITRATE ER 60 MG PO TB24
60.0000 mg | ORAL_TABLET | Freq: Every day | ORAL | Status: DC
  Administered 2017-07-04 – 2017-07-05 (×2): 60 mg via ORAL
  Filled 2017-07-04 (×2): qty 1

## 2017-07-04 MED ORDER — SODIUM CHLORIDE 0.9% FLUSH: 3.0000 mL | Freq: Two times a day (BID) | INTRAVENOUS | Status: DC

## 2017-07-04 MED ORDER — HYDRALAZINE HCL 20 MG/ML IJ SOLN: 5.0000 mg | INTRAMUSCULAR | Status: AC | PRN

## 2017-07-04 MED ORDER — ONDANSETRON HCL 4 MG/2ML IJ SOLN: 4.0000 mg | Freq: Four times a day (QID) | INTRAMUSCULAR | Status: DC | PRN

## 2017-07-04 MED ORDER — NITROGLYCERIN 5 MG/ML IV SOLN
INTRAVENOUS | Status: AC
  Filled 2017-07-04: qty 10

## 2017-07-04 MED ORDER — SODIUM CHLORIDE 0.9 % IV SOLN: 250.0000 mL | INTRAVENOUS | Status: DC | PRN

## 2017-07-04 MED ORDER — ACETAMINOPHEN 325 MG PO TABS
650.0000 mg | ORAL_TABLET | ORAL | Status: DC | PRN
  Administered 2017-07-04 – 2017-07-05 (×5): 650 mg via ORAL
  Filled 2017-07-04 (×4): qty 2

## 2017-07-04 MED ORDER — BIVALIRUDIN BOLUS VIA INFUSION - CUPID
INTRAVENOUS | Status: DC | PRN
  Administered 2017-07-04: 34.35 mg via INTRAVENOUS

## 2017-07-04 MED ORDER — ASPIRIN 81 MG PO CHEW
81.0000 mg | CHEWABLE_TABLET | ORAL | Status: AC
  Administered 2017-07-04: 81 mg via ORAL

## 2017-07-04 MED ORDER — ASPIRIN 81 MG PO CHEW
CHEWABLE_TABLET | ORAL | Status: DC | PRN
  Administered 2017-07-04: 243 mg via ORAL

## 2017-07-04 MED ORDER — CLOPIDOGREL BISULFATE 75 MG PO TABS
ORAL_TABLET | ORAL | Status: DC | PRN
  Administered 2017-07-04: 300 mg via ORAL

## 2017-07-04 MED ORDER — LABETALOL HCL 5 MG/ML IV SOLN: 10.0000 mg | INTRAVENOUS | Status: AC | PRN

## 2017-07-04 MED ORDER — CLOPIDOGREL BISULFATE 75 MG PO TABS
ORAL_TABLET | ORAL | Status: AC
  Filled 2017-07-04: qty 4

## 2017-07-04 MED ORDER — ACETAMINOPHEN 325 MG PO TABS
ORAL_TABLET | ORAL | Status: AC
  Administered 2017-07-04: 650 mg via ORAL
  Filled 2017-07-04: qty 2

## 2017-07-04 SURGICAL SUPPLY — 17 items
BALLN TREK RX 2.5X12 (BALLOONS) ×2
BALLOON TREK RX 2.5X12 (BALLOONS) ×1 IMPLANT
CATH INFINITI 5 FR IM (CATHETERS) ×2 IMPLANT
CATH INFINITI 5FR ANG PIGTAIL (CATHETERS) ×2 IMPLANT
CATH INFINITI 5FR JL4 (CATHETERS) ×2 IMPLANT
CATH INFINITI JR4 5F (CATHETERS) ×2 IMPLANT
CATH LAUNCHER 6FR EBU 3.5 SH (CATHETERS) ×4 IMPLANT
DEVICE CLOSURE MYNXGRIP 6/7F (Vascular Products) ×2 IMPLANT
DEVICE INFLAT 30 PLUS (MISCELLANEOUS) ×2 IMPLANT
KIT MANI 3VAL PERCEP (MISCELLANEOUS) ×2 IMPLANT
NEEDLE PERC 18GX7CM (NEEDLE) ×2 IMPLANT
PACK CARDIAC CATH (CUSTOM PROCEDURE TRAY) ×2 IMPLANT
SHEATH AVANTI 5FR X 11CM (SHEATH) ×2 IMPLANT
SHEATH AVANTI 6FR X 11CM (SHEATH) ×2 IMPLANT
STENT SIERRA 2.75 X 12 MM (Permanent Stent) ×2 IMPLANT
WIRE ASAHI PROWATER 180CM (WIRE) ×2 IMPLANT
WIRE GUIDERIGHT .035X150 (WIRE) ×2 IMPLANT

## 2017-07-04 NOTE — Progress Notes (Signed)
72 year-old female with known coronary disease, status post CABG, status post multiple stents, with worsening exertional chest pain in a crescendo pattern, which modestly improved after DES of mid RCA, and resolved after redo DES of in-stent restenosis of the ostium of left main on 11/24/2015. The patient had been doing well, with recent episodes of chest discomfort occurring during periods of low blood pressure and with exertion. ETT Myoview study revealed moderate to severe lateral wall ischemia. Patient has essential hypertension.   The patient has hyperlipidemia, with elevated LDL cholesterol, with history of statin intolerance.   Patient presented to [facility] for Outpatient Cath.  Cardiac Cath revealed - 1. Three-vessel coronary artery disease with 90% stenosis ostial left main, 95% stenosis proximal LAD, occluded ramus intermedius branch, occluded first obtuse marginal branch, 70% in-stent restenosis in mid RCA 2.  Normal left ventricular function 3.  Successful PCI with DES left main  Patient referred to Cardiac Cath with dx of coronary stent.  Rounded on patient.  Patient resting in bed with HOB elevated.  Significant other at bedside.    Explained to patient the reasoning for referral to Cardiac Rehab.  Overview of program provided.  Patient informed this RN that she had previously participated in Cardiac Rehab.  She reported she had been working out at [employer], but had to stop due to problems with her back.  Patient has spinal stenosis and bulging disc.  Patient declines to participate in Cardiac Rehab at this time given the issues with her back.    Discussed modifiable risk factors with patient.  Patient is a non-smoker.  Encouraged patient to be as active as she possibly can be.    Roanna Epley, RN, BSN, [facility] Nurse Navigator

## 2017-07-04 NOTE — OR Nursing (Signed)
Pt reported pain ingroin increased from 2 to 4. She reports history of hematoma x 2 post cath. Manual pressure held for 5 minutes but no hematoma noted. Pt reported pain decreased to  3/10 after 5 minute hold.

## 2017-07-04 NOTE — Progress Notes (Signed)
Talked to Dr. Nehemiah Massed about patient's BP of 81/45, patient complaints of light headedness, but no chest pain or SOB. Patient received lasix, imdur and metoprolol at 1242. Per patient this is not unusual for her as this happens whenever she have cardiac cath and stent placement. Per MD continue to monitor BP and do not give antihypertensive medication. RN will continue to monitor.

## 2017-07-05 DIAGNOSIS — R079 Chest pain, unspecified: Secondary | ICD-10-CM | POA: Diagnosis not present

## 2017-07-05 LAB — BASIC METABOLIC PANEL
ANION GAP: 8 (ref 5–15)
BUN: 27 mg/dL — ABNORMAL HIGH (ref 6–20)
CALCIUM: 8.4 mg/dL — AB (ref 8.9–10.3)
CO2: 20 mmol/L — ABNORMAL LOW (ref 22–32)
Chloride: 110 mmol/L (ref 101–111)
Creatinine, Ser: 1.89 mg/dL — ABNORMAL HIGH (ref 0.44–1.00)
GFR, EST AFRICAN AMERICAN: 30 mL/min — AB (ref >60)
GFR, EST NON AFRICAN AMERICAN: 26 mL/min — AB (ref >60)
GLUCOSE: 94 mg/dL (ref 65–99)
POTASSIUM: 3.6 mmol/L (ref 3.5–5.1)
SODIUM: 138 mmol/L (ref 135–145)

## 2017-07-05 LAB — CBC
HEMATOCRIT: 32.4 % — AB (ref 35.0–47.0)
HEMOGLOBIN: 10.5 g/dL — AB (ref 12.0–16.0)
MCH: 30 pg (ref 26.0–34.0)
MCHC: 32.5 g/dL (ref 32.0–36.0)
MCV: 92.3 fL (ref 80.0–100.0)
Platelets: 227 10*3/uL (ref 150–440)
RBC: 3.51 MIL/uL — ABNORMAL LOW (ref 3.80–5.20)
RDW: 15.2 % — ABNORMAL HIGH (ref 11.5–14.5)
WBC: 6.5 10*3/uL (ref 3.6–11.0)

## 2017-07-05 NOTE — Care Management Obs Status (Signed)
[place]   Patient Details  Name: [name] MRN: [id number] Date of Birth: 1945-08-28   Medicare Observation Status Notification Given:  No  Discharge order placed in < 24hr of being placed on observation. Outpatient in bed procedure   Katrina Stack, RN 07/05/2017, 8:31 AM

## 2017-07-05 NOTE — Progress Notes (Signed)
Pt. Discharged home via private vehicle with spouse. Alert and oriented. No distress noted. IV removed x2. Discharge teaching given to pt. With teach back.

## 2017-07-05 NOTE — Discharge Summary (Signed)
   Physician Discharge Summary  Patient ID: [name] MRN: [id number] DOB/AGE: 06/23/1945 72 y.o.  Admit date: 07/04/2017 Discharge date: 07/05/2017  Primary Discharge Diagnosis Chest pain Secondary Discharge Diagnosis coronary artery disease  Significant Diagnostic Studies: yes  Consults: None  Hospital Course: The patient underwent elective cardiac catheterization on 07/04/2017 which revealed three-vessel coronary artery disease, with 90% stenosis ostial left main, 95% stenosis proximal LAD, occluded ramus intermedius branch, occluded OM1 and 70% in-stent restenosis of mid RCA with patent LIMA to LAD, occluded SVG to ramus intermedius branch, and occluded SVG to OM1.  Left ventriculography revealed normal left ventricular function.  The patient underwent PCI, receiving 2.75 x 12 mm XienceSierra stent the ostial left main.  The patient had an uncomplicated hospital stay.  On the morning of 07/05/2016, the patient was ambulating without difficulty was discharged home.   Discharge Exam: Blood pressure (!) 115/54, pulse 71, temperature 98 F (36.7 C), temperature source Oral, resp. rate 16, height 5' (1.524 m), weight 45.8 kg (101 lb), last menstrual period 11/03/1980, SpO2 98 %.   General appearance: alert Head: Normocephalic, without obvious abnormality, atraumatic Eyes: conjunctivae/corneas clear. PERRL, EOM's intact. Fundi benign. Ears: normal TM's and external ear canals both ears Nose: Nares normal. Septum midline. Mucosa normal. No drainage or sinus tenderness. Throat: lips, mucosa, and tongue normal; teeth and gums normal Neck: no adenopathy, no carotid bruit, no JVD, supple, symmetrical, trachea midline and thyroid not enlarged, symmetric, no tenderness/mass/nodules Back: symmetric, no curvature. ROM normal. No CVA tenderness. Resp: clear to auscultation bilaterally Chest wall: no tenderness Cardio: regular rate and rhythm, S1, S2 normal, no murmur, click, rub or  gallop GI: soft, non-tender; bowel sounds normal; no masses,  no organomegaly Extremities: extremities normal, atraumatic, no cyanosis or edema Pulses: 2+ and symmetric Skin: Skin color, texture, turgor normal. No rashes or lesions Neurologic: Grossly normal Labs:   Lab Results  Component Value Date   WBC 6.5 07/05/2017   HGB 10.5 (L) 07/05/2017   HCT 32.4 (L) 07/05/2017   MCV 92.3 07/05/2017   PLT 227 07/05/2017    Recent Labs  Lab 07/05/17 0448  NA 138  K 3.6  CL 110  CO2 20*  BUN 27*  CREATININE 1.89*  CALCIUM 8.4*  GLUCOSE 94      Radiology:  EKG: Normal sinus rhythm  FOLLOW UP PLANS AND APPOINTMENTS Discharge Instructions    Amb Referral to Cardiac Rehabilitation   Complete by:  As directed    Diagnosis:  Coronary Stents        BRING ALL MEDICATIONS WITH YOU TO FOLLOW UP APPOINTMENTS  Time spent with patient to include physician time: 25 minutes Signed:  Isaias Cowman MD, PhD, [facility] 07/05/2017, 7:53 AM

## 2017-07-12 ENCOUNTER — Telehealth: Payer: Self-pay | Admitting: Cardiology

## 2017-07-29 ENCOUNTER — Emergency Department: Payer: Medicare Other

## 2017-07-29 ENCOUNTER — Emergency Department
Admission: EM | Admit: 2017-07-29 | Discharge: 2017-07-30 | Disposition: A | Discharge status: Home / Self Care | Payer: Medicare Other | Attending: Emergency Medicine | Admitting: Emergency Medicine

## 2017-07-29 ENCOUNTER — Encounter: Payer: Self-pay | Admitting: Emergency Medicine

## 2017-07-29 DIAGNOSIS — E876 Hypokalemia: Secondary | ICD-10-CM

## 2017-07-29 DIAGNOSIS — R079 Chest pain, unspecified: Secondary | ICD-10-CM | POA: Diagnosis present

## 2017-07-29 DIAGNOSIS — Z79899 Other long term (current) drug therapy: Secondary | ICD-10-CM | POA: Diagnosis not present

## 2017-07-29 DIAGNOSIS — N189 Chronic kidney disease, unspecified: Secondary | ICD-10-CM | POA: Diagnosis not present

## 2017-07-29 DIAGNOSIS — I129 Hypertensive chronic kidney disease with stage 1 through stage 4 chronic kidney disease, or unspecified chronic kidney disease: Secondary | ICD-10-CM | POA: Diagnosis not present

## 2017-07-29 DIAGNOSIS — Z955 Presence of coronary angioplasty implant and graft: Secondary | ICD-10-CM | POA: Insufficient documentation

## 2017-07-29 DIAGNOSIS — Z951 Presence of aortocoronary bypass graft: Secondary | ICD-10-CM | POA: Diagnosis not present

## 2017-07-29 DIAGNOSIS — I2510 Atherosclerotic heart disease of native coronary artery without angina pectoris: Secondary | ICD-10-CM | POA: Diagnosis not present

## 2017-07-29 DIAGNOSIS — Z85828 Personal history of other malignant neoplasm of skin: Secondary | ICD-10-CM | POA: Diagnosis not present

## 2017-07-29 DIAGNOSIS — Z7982 Long term (current) use of aspirin: Secondary | ICD-10-CM | POA: Diagnosis not present

## 2017-07-29 DIAGNOSIS — R55 Syncope and collapse: Secondary | ICD-10-CM

## 2017-07-29 DIAGNOSIS — Z7902 Long term (current) use of antithrombotics/antiplatelets: Secondary | ICD-10-CM | POA: Diagnosis not present

## 2017-07-29 LAB — CBC
HEMATOCRIT: 38.1 % (ref 35.0–47.0)
HEMOGLOBIN: 12.6 g/dL (ref 12.0–16.0)
MCH: 30.2 pg (ref 26.0–34.0)
MCHC: 33.1 g/dL (ref 32.0–36.0)
MCV: 91.1 fL (ref 80.0–100.0)
Platelets: 336 10*3/uL (ref 150–440)
RBC: 4.19 MIL/uL (ref 3.80–5.20)
RDW: 14.6 % — ABNORMAL HIGH (ref 11.5–14.5)
WBC: 7.3 10*3/uL (ref 3.6–11.0)

## 2017-07-29 LAB — BASIC METABOLIC PANEL
ANION GAP: 14 (ref 5–15)
BUN: 36 mg/dL — ABNORMAL HIGH (ref 6–20)
CALCIUM: 9.2 mg/dL (ref 8.9–10.3)
CO2: 16 mmol/L — ABNORMAL LOW (ref 22–32)
Chloride: 108 mmol/L (ref 101–111)
Creatinine, Ser: 2.25 mg/dL — ABNORMAL HIGH (ref 0.44–1.00)
GFR, EST AFRICAN AMERICAN: 24 mL/min — AB (ref >60)
GFR, EST NON AFRICAN AMERICAN: 21 mL/min — AB (ref >60)
Glucose, Bld: 110 mg/dL — ABNORMAL HIGH (ref 65–99)
POTASSIUM: 2.4 mmol/L — AB (ref 3.5–5.1)
SODIUM: 138 mmol/L (ref 135–145)

## 2017-07-29 LAB — TROPONIN I

## 2017-07-29 MED ORDER — POTASSIUM CHLORIDE 10 MEQ/100ML IV SOLN
10.0000 meq | Freq: Once | INTRAVENOUS | Status: AC
  Administered 2017-07-29: 10 meq via INTRAVENOUS
  Filled 2017-07-29: qty 100

## 2017-07-29 MED ORDER — POTASSIUM CHLORIDE CRYS ER 20 MEQ PO TBCR
40.0000 meq | EXTENDED_RELEASE_TABLET | Freq: Once | ORAL | Status: AC
  Administered 2017-07-29: 40 meq via ORAL
  Filled 2017-07-29: qty 2

## 2017-07-29 MED ORDER — POTASSIUM CHLORIDE 20 MEQ PO PACK
PACK | ORAL | Status: AC
  Administered 2017-07-29: 40 meq via ORAL
  Filled 2017-07-29: qty 2

## 2017-07-29 MED ORDER — POTASSIUM CHLORIDE 20 MEQ PO PACK
40.0000 meq | PACK | Freq: Once | ORAL | Status: AC
  Administered 2017-07-29: 40 meq via ORAL

## 2017-07-29 MED ORDER — SODIUM CHLORIDE 0.9 % IV BOLUS (SEPSIS)
1000.0000 mL | Freq: Once | INTRAVENOUS | Status: AC
  Administered 2017-07-29: 1000 mL via INTRAVENOUS

## 2017-07-29 NOTE — ED Provider Notes (Signed)
[facility] Emergency Department Provider Note  Time seen: 9:54 PM  I have reviewed the triage vital signs and the nursing notes.   HISTORY  Chief Complaint Chest Pain    HPI [name] is a 72 y.o. female with a past medical history of CAD, CKD, hypertension, hyperlipidemia, CAD status post multiple cardiac stents, last which occurred 1 month ago, presents to the emergency department after syncopal episode with chest pain.  According to the patient her husband was acting abnormal at the restaurant, to the point where they called EMS.  She states she was walking out with EMS when she began feeling lightheaded had chest pain and a syncopal episode.  Patient states the chest pain is very common for her, states she gets it every day over the past 1 month.  She also states she passes out very frequently, estimates 5 times in the past 1 year.  Currently she says very mild chest discomfort which she feels as a pressure sensation unchanged from her normal chest pain per patient.  Largely negative review of systems otherwise.   Past Medical History:  Diagnosis Date  . Acid reflux   . Anemia   . CAD (coronary artery disease)   . Chronic constipation   . Chronic kidney disease    stage 3 chronic kidney disease  . Eczema   . H/O heart artery stent   . Hyperlipidemia   . Hypertension   . IBS (irritable bowel syndrome) 2004  . Migraine   . Mitral valve prolapse   . Peptic ulcer 1989  . Skin cancer     Patient Active Problem List   Diagnosis Date Noted  . Unstable angina ([place]) 11/24/2015  . CAD (coronary artery disease) 10/21/2015  . Iron deficiency anemia 09/14/2015  . Blood in the urine 12/17/2014  . Allergic state 11/04/2014  . Billowing mitral valve 11/04/2014  . Gastroduodenal ulcer 11/04/2014  . Gross hematuria 11/04/2014  . Atrophic vaginitis 11/04/2014  . HLD (hyperlipidemia) 12/11/2013  . Drug intolerance 12/11/2013  . Regurgitation  12/11/2013  . Absolute anemia 09/16/2013  . Chronic kidney disease 09/16/2013  . Adaptive colitis 09/16/2013  . Abnormal echocardiogram 08/02/2007  . Arteriosclerosis of coronary artery 09/28/2005  . Acid reflux 06/30/2003    Past Surgical History:  Procedure Laterality Date  . ABDOMINAL HYSTERECTOMY    . CARDIAC CATHETERIZATION N/A 10/21/2015   Procedure: Left Heart Cath and Coronary Angiography;  Surgeon: Isaias Cowman, MD;  Location: [place] [facility];  Service: Cardiovascular;  Laterality: N/A;  . CARDIAC CATHETERIZATION N/A 10/21/2015   Procedure: Coronary Stent Intervention;  Surgeon: Isaias Cowman, MD;  Location: [place] [facility];  Service: Cardiovascular;  Laterality: N/A;  . CARDIAC CATHETERIZATION Left 11/24/2015   Procedure: Coronary Stent Intervention;  Surgeon: Isaias Cowman, MD;  Location: [place] [facility];  Service: Cardiovascular;  Laterality: Left;  . CARDIAC SURGERY  2007   cardiac stent place  . CATARACT EXTRACTION  2012  . CHOLECYSTECTOMY    . CORONARY ANGIOPLASTY WITH STENT PLACEMENT  2013  . CORONARY ARTERY BYPASS GRAFT     triple  . CORONARY STENT INTERVENTION N/A 07/04/2017   Procedure: CORONARY STENT INTERVENTION;  Surgeon: Isaias Cowman, MD;  Location: [place] [facility];  Service: Cardiovascular;  Laterality: N/A;  . CYSTOSCOPY W/ RETROGRADES Bilateral 12/29/2014   Procedure: CYSTOSCOPY WITH RETROGRADE PYELOGRAM;  Surgeon: Collier Flowers, MD;  Location: [facility];  Service: Urology;  Laterality: Bilateral;  . CYSTOSCOPY WITH  BIOPSY N/A 12/29/2014   Procedure: CYSTOSCOPY WITH BIOPSY;  Surgeon: Collier Flowers, MD;  Location: [facility];  Service: Urology;  Laterality: N/A;  . HAND SURGERY  1998  . LEFT HEART CATH AND CORS/GRAFTS ANGIOGRAPHY N/A 07/04/2017   Procedure: LEFT HEART CATH AND CORS/GRAFTS ANGIOGRAPHY;  Surgeon: Isaias Cowman, MD;  Location: [place] [facility];  Service: Cardiovascular;  Laterality: N/A;  .  SIGMOID RESECTION / RECTOPEXY  2006  . SKIN CANCER EXCISION  2013  . TENDON REPAIR     right elbow  . TRIGGER FINGER RELEASE      Prior to Admission medications   Medication Sig Start Date End Date Taking? Authorizing Provider  acetaminophen (TYLENOL) 500 MG tablet Take 1,000 mg by mouth every 6 (six) hours as needed for moderate pain or headache.  06/14/11   [provider]  allopurinol (ZYLOPRIM) 100 MG tablet Take 100 mg by mouth daily.    [provider]  aspirin 325 MG tablet Take 325 mg by mouth daily with lunch.  04/07/11   [provider]  AZO-CRANBERRY PO Take 1 capsule by mouth daily.    [provider]  clopidogrel (PLAVIX) 75 MG tablet Take 75 mg by mouth daily 08/11/14   [provider]  docusate sodium (CVS STOOL SOFTENER) 50 MG capsule Take 100 mg by mouth daily.  04/07/11   [provider]  furosemide (LASIX) 40 MG tablet Take 20 mg by mouth daily.    [provider]  gemfibrozil (LOPID) 600 MG tablet Take 600 mg by mouth 2 (two) times daily.  05/06/16 07/04/17  [provider]  hyoscyamine (LEVSIN SL) 0.125 MG SL tablet Take 0.125 mg by mouth 2 (two) times daily before lunch and supper.  04/07/11   [provider]  isosorbide mononitrate (IMDUR) 60 MG 24 hr tablet TAKE 60 MG BY MOUTH EVERY MORNING 08/11/14   [provider]  MAGNESIUM CITRATE PO Take 100 mg by mouth daily.     [provider]  metoprolol succinate (TOPROL-XL) 25 MG 24 hr tablet Take 25 mg by mouth daily with lunch.  04/07/11   [provider]  nitroGLYCERIN (NITROSTAT) 0.4 MG SL tablet Place 0.4 mg under the tongue every 5 (five) minutes as needed for chest pain.     [provider]  pantoprazole (PROTONIX) 40 MG tablet Take 40 mg by mouth daily.    [provider]  potassium chloride (MICRO-K) 10 MEQ CR capsule Take 20 mEq by mouth daily.  07/17/14   [provider]  promethazine  (PHENERGAN) 25 MG tablet Take 25 mg by mouth every 8 (eight) hours as needed for nausea or vomiting.  06/14/11   [provider]  riboflavin (VITAMIN B-2) 100 MG TABS tablet Take 100 mg by mouth daily.    [provider]  senna (SENOKOT) 8.6 MG tablet Take 6 tablets by mouth at bedtime.     [provider]  tiZANidine (ZANAFLEX) 2 MG tablet Take 2 mg by mouth 3 (three) times daily as needed for muscle spasms. 04/08/17   [provider]    Allergies  Allergen Reactions  . Prochlorperazine Nausea And Vomiting and Other (See Comments)    Severe vomiting  . Ezetimibe Other (See Comments)    Muscle Pain  . Statins Other (See Comments)    Muscle Pain  . Tape Other (See Comments)    Pulls my skin off when the tape is removed. Use paper tape only.  Marland Kitchen  Cephalexin Rash  . Metoclopramide Rash and Other (See Comments)    Elevates BP, face draws to one side Elevates BP, face draws to one side  . Penicillin G Rash  . Penicillins Rash and Other (See Comments)    Has patient had a PCN reaction causing immediate rash, facial/tongue/throat swelling, SOB or lightheadedness with hypotension: Yes Has patient had a PCN reaction causing severe rash involving mucus membranes or skin necrosis: Yes Has patient had a PCN reaction that required hospitalization No Has patient had a PCN reaction occurring within the last 10 years: No If all of the above answers are "NO", then may proceed with Cephalosporin use.     Family History  Problem Relation Age of Onset  . Kidney Stones Father   . Prostate cancer Father   . Heart disease Father   . Kidney Stones Brother   . Heart disease Mother     Social History Social History   Tobacco Use  . Smoking status: Never Smoker  . Smokeless tobacco: Never Used  Substance Use Topics  . Alcohol use: No    Alcohol/week: 0.0 oz  . Drug use: No    Review of Systems Constitutional: Positive for syncope/loss of consciousness. Eyes:  Negative for visual complaints ENT: Negative for recent illness/congestion Cardiovascular: Positive for chest pain today. Respiratory: Negative for shortness of breath. Gastrointestinal: Negative for abdominal pain, vomiting Genitourinary: Negative for urinary compaints Musculoskeletal: Negative for leg pain or swelling Skin: Negative for skin complaints  Neurological: Negative for headache All other ROS negative  ____________________________________________   PHYSICAL EXAM:  VITAL SIGNS: ED Triage Vitals  Enc Vitals Group     BP 07/29/17 2059 127/72     Pulse Rate 07/29/17 2059 78     Resp 07/29/17 2059 18     Temp 07/29/17 2059 97.6 F (36.4 C)     Temp Source 07/29/17 2059 Oral     SpO2 07/29/17 2059 98 %     Weight --      Height --      Head Circumference --      Peak Flow --      Pain Score 07/29/17 2100 6     Pain Loc --      Pain Edu? --      Excl. in [place]? --    Constitutional: Alert and oriented. Well appearing and in no distress. Eyes: Normal exam ENT   Head: Normocephalic and atraumatic.   Mouth/Throat: Mucous membranes are moist. Cardiovascular: Normal rate, regular rhythm.  Respiratory: Normal respiratory effort without tachypnea nor retractions. Breath sounds are clear  Gastrointestinal: Soft and nontender. No distention. Musculoskeletal: Nontender with normal range of motion in all extremities. No lower extremity tenderness or edema. Neurologic:  Normal speech and language. No gross focal neurologic deficits  Skin:  Skin is warm, dry and intact.  Psychiatric: Mood and affect are normal.  ____________________________________________    EKG  EKG reviewed and interpreted by myself shows normal sinus rhythm at 79 bpm with a narrow QRS, normal axis, normal intervals with nonspecific ST changes.  ____________________________________________    RADIOLOGY  X-ray shows no active disease  ____________________________________________   INITIAL  IMPRESSION / ASSESSMENT AND PLAN / ED COURSE  Pertinent labs & imaging results that were available during my care of the patient were reviewed by me and considered in my medical decision making (see chart for details).  Patient presents to the emergency department after syncopal episode with chest pain.  According to  the patient they were at a restaurant eating when the husband began acting abnormal, ended up calling EMS for the husband however the patient states upon walking out with EMS to the truck she became lightheaded developed chest pain and had a syncopal episode.  Patient states she often developed chest pain in fact she states for the past 1 month she has had the same chest pain on a daily basis since she had a stent placed at the end of February.  Patient states she also fairly often passes out, states over the past 1 year she estimates approximately 5 times she is passed out.  Patient continues to state chest discomfort approximately a 7/10 currently in the center of her chest.  Denies any nausea or diaphoresis at this time.  Denies any shortness of breath.  Denies any leg pain or swelling.  Largely negative review of systems otherwise.  Patient states she normally takes nitroglycerin for her chest pain at home, but has not tried any today.  Differential would include ACS, angina, syncope, cardiogenic syncope.  We will check labs, x-ray, EKG and continue to closely monitor.  Patient's labs show significant hypokalemia of 2.4.  Patient states she often has low potassium she takes supplements at home, when it is very low she increases her supplementation.  Patient also appears to have mildly worsening renal function 2.2 creatinine compared to 1.83 weeks ago.  We will continue to closely monitor in the emergency department while awaiting further results.    Troponin is negative.  Given the patient's high risk given her prior stents even though the patient's troponin and labs are normal I still  highly recommended admission to the hospital.  Patient states her chest pain is nearly gone, she does not want to be admitted to the hospital, wishes to go home.  Patient is agreeable to stay for repeat troponin which will occur at 1230.  Also of note the patient's potassium today is down to 2.4.  Patient states she has been taking her low-dose potassium recently but will increase to her high dose potassium which is 40 mEq twice a day and follow-up with her doctor on Monday for recheck of her labs.  At 1230 we will recheck her potassium as well as a troponin.  Patient continues to wish to be discharged, we will discharge home with PCP follow-up.  I have already discussed very strict return precautions with the patient, she is agreeable but still wishes to be discharged home.  Repeat troponin pending, repeat BMP pending, patient care signed out to Dr. Dahlia Client.  ____________________________________________   FINAL CLINICAL IMPRESSION(S) / ED DIAGNOSES  Chest pain Syncope Hypokalemia   Harvest Dark, MD 07/29/17 2359

## 2017-07-29 NOTE — ED Notes (Signed)
Blood draw on hold till potassium infusion is complete. MD aware.

## 2017-07-29 NOTE — ED Notes (Signed)
Patient is resting comfortably. 

## 2017-07-29 NOTE — ED Notes (Signed)
Patient was unable to tolerate taking the K pills and vomited up the first pill.  Seeking VO for oral powder K instead.

## 2017-07-29 NOTE — ED Triage Notes (Addendum)
Patient was at [facility] with husband, he had syncopal episode and while she was in ems with him, she had a syncopal episode too.  She has 6 total stents placed and last one placed in February this year.  Pt is co CP at this time 7/10.  She is also c/o radiation into left shoulder.  EMS states etoh on board for both patients.

## 2017-07-30 DIAGNOSIS — E876 Hypokalemia: Secondary | ICD-10-CM | POA: Diagnosis not present

## 2017-07-30 LAB — BASIC METABOLIC PANEL
ANION GAP: 7 (ref 5–15)
BUN: 31 mg/dL — ABNORMAL HIGH (ref 6–20)
CO2: 19 mmol/L — ABNORMAL LOW (ref 22–32)
Calcium: 8.1 mg/dL — ABNORMAL LOW (ref 8.9–10.3)
Chloride: 114 mmol/L — ABNORMAL HIGH (ref 101–111)
Creatinine, Ser: 2.03 mg/dL — ABNORMAL HIGH (ref 0.44–1.00)
GFR calc Af Amer: 28 mL/min — ABNORMAL LOW (ref >60)
GFR, EST NON AFRICAN AMERICAN: 24 mL/min — AB (ref >60)
GLUCOSE: 79 mg/dL (ref 65–99)
POTASSIUM: 3.9 mmol/L (ref 3.5–5.1)
SODIUM: 140 mmol/L (ref 135–145)

## 2017-07-30 LAB — TROPONIN I

## 2017-07-30 NOTE — ED Provider Notes (Signed)
-----------------------------------------   1:44 AM on 07/30/2017 -----------------------------------------   Blood pressure (!) 96/57, pulse 71, temperature 97.6 F (36.4 C), temperature source Oral, resp. rate 11, last menstrual period 11/03/1980, SpO2 100 %.  Assuming care from Dr. Kerman Passey.  In short, [name] is a 72 y.o. female with a chief complaint of Chest Pain .  Refer to the original H&P for additional details.  The current plan of care is to follow up the results of the repeat BMP and repeat troponin.     The patient's BMP shows a potassium of 3.9 with a bicarb of 19.  The patient's repeat troponin was 0.03.  The patient's repeat creatinine was 2.03.  The patient still does not want to stay in the hospital but as her blood work numbers have improved we feel it appropriate to discharge her to home as she has requested and have her follow-up with her cardiologist.  The patient has no complaints or concerns at this time.  She will be discharged.   Loney Hering, MD 07/30/17 [phone]

## 2017-07-30 NOTE — Discharge Instructions (Addendum)
Please follow-up with your cardiologist as we have discussed.  Please return with any worsening condition or any other concerns.

## 2017-08-08 ENCOUNTER — Encounter: Payer: Self-pay | Admitting: *Deleted

## 2017-08-08 ENCOUNTER — Observation Stay
Admission: RE | Admit: 2017-08-08 | Discharge: 2017-08-09 | Disposition: A | Discharge status: Home / Self Care | Payer: Medicare Other | Source: Ambulatory Visit | Attending: Cardiology | Admitting: Cardiology

## 2017-08-08 ENCOUNTER — Other Ambulatory Visit: Payer: Self-pay

## 2017-08-08 ENCOUNTER — Encounter
Admission: RE | Disposition: A | Discharge status: Home / Self Care | Payer: Self-pay | Source: Ambulatory Visit | Attending: Cardiology

## 2017-08-08 DIAGNOSIS — Y838 Other surgical procedures as the cause of abnormal reaction of the patient, or of later complication, without mention of misadventure at the time of the procedure: Secondary | ICD-10-CM | POA: Diagnosis not present

## 2017-08-08 DIAGNOSIS — Z79899 Other long term (current) drug therapy: Secondary | ICD-10-CM | POA: Diagnosis not present

## 2017-08-08 DIAGNOSIS — E782 Mixed hyperlipidemia: Secondary | ICD-10-CM | POA: Diagnosis not present

## 2017-08-08 DIAGNOSIS — Z5309 Procedure and treatment not carried out because of other contraindication: Secondary | ICD-10-CM

## 2017-08-08 DIAGNOSIS — N183 Chronic kidney disease, stage 3 (moderate): Secondary | ICD-10-CM | POA: Insufficient documentation

## 2017-08-08 DIAGNOSIS — Z955 Presence of coronary angioplasty implant and graft: Secondary | ICD-10-CM | POA: Diagnosis not present

## 2017-08-08 DIAGNOSIS — Z7902 Long term (current) use of antithrombotics/antiplatelets: Secondary | ICD-10-CM | POA: Diagnosis not present

## 2017-08-08 DIAGNOSIS — Z888 Allergy status to other drugs, medicaments and biological substances status: Secondary | ICD-10-CM | POA: Diagnosis not present

## 2017-08-08 DIAGNOSIS — R079 Chest pain, unspecified: Principal | ICD-10-CM | POA: Insufficient documentation

## 2017-08-08 DIAGNOSIS — T82855A Stenosis of coronary artery stent, initial encounter: Secondary | ICD-10-CM | POA: Diagnosis not present

## 2017-08-08 DIAGNOSIS — I25810 Atherosclerosis of coronary artery bypass graft(s) without angina pectoris: Secondary | ICD-10-CM | POA: Insufficient documentation

## 2017-08-08 DIAGNOSIS — I129 Hypertensive chronic kidney disease with stage 1 through stage 4 chronic kidney disease, or unspecified chronic kidney disease: Secondary | ICD-10-CM | POA: Insufficient documentation

## 2017-08-08 DIAGNOSIS — Z880 Allergy status to penicillin: Secondary | ICD-10-CM | POA: Insufficient documentation

## 2017-08-08 DIAGNOSIS — I2510 Atherosclerotic heart disease of native coronary artery without angina pectoris: Secondary | ICD-10-CM | POA: Diagnosis present

## 2017-08-08 DIAGNOSIS — Z881 Allergy status to other antibiotic agents status: Secondary | ICD-10-CM | POA: Diagnosis not present

## 2017-08-08 DIAGNOSIS — Z7982 Long term (current) use of aspirin: Secondary | ICD-10-CM | POA: Insufficient documentation

## 2017-08-08 HISTORY — PX: CORONARY STENT INTERVENTION: CATH118234

## 2017-08-08 HISTORY — PX: LEFT HEART CATH AND CORONARY ANGIOGRAPHY: CATH118249

## 2017-08-08 LAB — POCT ACTIVATED CLOTTING TIME: Activated Clotting Time: 263 seconds

## 2017-08-08 SURGERY — LEFT HEART CATH AND CORONARY ANGIOGRAPHY
Anesthesia: Moderate Sedation

## 2017-08-08 MED ORDER — SODIUM CHLORIDE 0.9% FLUSH
3.0000 mL | INTRAVENOUS | Status: DC | PRN
Start: 2017-08-08 — End: 2017-08-09

## 2017-08-08 MED ORDER — SODIUM CHLORIDE 0.9 % IV SOLN
INTRAVENOUS | Status: DC | PRN
  Administered 2017-08-08: 1.75 mg/kg/h via INTRAVENOUS

## 2017-08-08 MED ORDER — LABETALOL HCL 5 MG/ML IV SOLN: 10.0000 mg | INTRAVENOUS | Status: AC | PRN

## 2017-08-08 MED ORDER — ACETAMINOPHEN 325 MG PO TABS
ORAL_TABLET | ORAL | Status: AC
  Administered 2017-08-08: 13:00:00
  Filled 2017-08-08: qty 2

## 2017-08-08 MED ORDER — SODIUM CHLORIDE 0.9 % WEIGHT BASED INFUSION
1.0000 mL/kg/h | INTRAVENOUS | Status: AC
  Administered 2017-08-08: 1 mL/kg/h via INTRAVENOUS

## 2017-08-08 MED ORDER — FENTANYL CITRATE (PF) 100 MCG/2ML IJ SOLN
INTRAMUSCULAR | Status: AC
  Filled 2017-08-08: qty 2

## 2017-08-08 MED ORDER — CLOPIDOGREL BISULFATE 75 MG PO TABS
75.0000 mg | ORAL_TABLET | Freq: Every day | ORAL | Status: DC
  Administered 2017-08-09: 75 mg via ORAL
  Filled 2017-08-08: qty 1

## 2017-08-08 MED ORDER — SODIUM CHLORIDE 0.9 % IV SOLN: 250.0000 mL | INTRAVENOUS | Status: DC | PRN

## 2017-08-08 MED ORDER — IOPAMIDOL (ISOVUE-300) INJECTION 61%
INTRAVENOUS | Status: DC | PRN
  Administered 2017-08-08: 110 mL via INTRA_ARTERIAL

## 2017-08-08 MED ORDER — NITROGLYCERIN 1 MG/10 ML FOR IR/CATH LAB
INTRA_ARTERIAL | Status: DC | PRN
  Administered 2017-08-08: 300 ug via INTRACORONARY

## 2017-08-08 MED ORDER — FUROSEMIDE 20 MG PO TABS
20.0000 mg | ORAL_TABLET | Freq: Every day | ORAL | Status: DC
  Administered 2017-08-08 – 2017-08-09 (×2): 20 mg via ORAL
  Filled 2017-08-08 (×2): qty 1

## 2017-08-08 MED ORDER — SODIUM CHLORIDE 0.9 % WEIGHT BASED INFUSION: 45.8000 mL/h | INTRAVENOUS | Status: DC

## 2017-08-08 MED ORDER — ASPIRIN 81 MG PO CHEW
CHEWABLE_TABLET | ORAL | Status: AC
  Administered 2017-08-08: 81 mg via ORAL
  Filled 2017-08-08: qty 1

## 2017-08-08 MED ORDER — SODIUM CHLORIDE 0.9% FLUSH: 3.0000 mL | Freq: Two times a day (BID) | INTRAVENOUS | Status: DC

## 2017-08-08 MED ORDER — ALLOPURINOL 100 MG PO TABS
100.0000 mg | ORAL_TABLET | Freq: Every day | ORAL | Status: DC
  Administered 2017-08-08 – 2017-08-09 (×2): 100 mg via ORAL
  Filled 2017-08-08 (×2): qty 1

## 2017-08-08 MED ORDER — BIVALIRUDIN TRIFLUOROACETATE 250 MG IV SOLR
INTRAVENOUS | Status: AC
  Filled 2017-08-08: qty 250

## 2017-08-08 MED ORDER — HEPARIN (PORCINE) IN NACL 2-0.9 UNIT/ML-% IJ SOLN
INTRAMUSCULAR | Status: AC
  Filled 2017-08-08: qty 1000

## 2017-08-08 MED ORDER — ASPIRIN 81 MG PO CHEW
CHEWABLE_TABLET | ORAL | Status: DC | PRN
  Administered 2017-08-08: 243 mg via ORAL

## 2017-08-08 MED ORDER — ACETAMINOPHEN 325 MG PO TABS
650.0000 mg | ORAL_TABLET | ORAL | Status: DC | PRN
  Administered 2017-08-08: 650 mg via ORAL

## 2017-08-08 MED ORDER — TRAMADOL HCL 50 MG PO TABS
ORAL_TABLET | ORAL | Status: AC
  Administered 2017-08-08: 50 mg via ORAL
  Filled 2017-08-08: qty 1

## 2017-08-08 MED ORDER — ASPIRIN 81 MG PO CHEW
CHEWABLE_TABLET | ORAL | Status: AC
  Filled 2017-08-08: qty 3

## 2017-08-08 MED ORDER — BIVALIRUDIN BOLUS VIA INFUSION - CUPID
INTRAVENOUS | Status: DC | PRN
  Administered 2017-08-08: 34.35 mg via INTRAVENOUS

## 2017-08-08 MED ORDER — HYDRALAZINE HCL 20 MG/ML IJ SOLN: 5.0000 mg | INTRAMUSCULAR | Status: AC | PRN

## 2017-08-08 MED ORDER — CLOPIDOGREL BISULFATE 75 MG PO TABS
ORAL_TABLET | ORAL | Status: AC
  Filled 2017-08-08: qty 4

## 2017-08-08 MED ORDER — CLOPIDOGREL BISULFATE 75 MG PO TABS
ORAL_TABLET | ORAL | Status: DC | PRN
  Administered 2017-08-08: 300 mg via ORAL

## 2017-08-08 MED ORDER — NITROGLYCERIN 5 MG/ML IV SOLN
INTRAVENOUS | Status: AC
  Filled 2017-08-08: qty 10

## 2017-08-08 MED ORDER — METOPROLOL SUCCINATE ER 50 MG PO TB24
25.0000 mg | ORAL_TABLET | Freq: Every day | ORAL | Status: DC
  Administered 2017-08-08 – 2017-08-09 (×2): 25 mg via ORAL
  Filled 2017-08-08 (×2): qty 1

## 2017-08-08 MED ORDER — SODIUM CHLORIDE 0.9 % WEIGHT BASED INFUSION
137.4000 mL/h | INTRAVENOUS | Status: AC
  Administered 2017-08-08: 07:00:00 via INTRAVENOUS

## 2017-08-08 MED ORDER — SODIUM CHLORIDE 0.9% FLUSH: 3.0000 mL | INTRAVENOUS | Status: DC | PRN

## 2017-08-08 MED ORDER — MIDAZOLAM HCL 2 MG/2ML IJ SOLN
INTRAMUSCULAR | Status: DC | PRN
  Administered 2017-08-08: 0.5 mg via INTRAVENOUS

## 2017-08-08 MED ORDER — FENTANYL CITRATE (PF) 100 MCG/2ML IJ SOLN
INTRAMUSCULAR | Status: DC | PRN
  Administered 2017-08-08: 25 ug via INTRAVENOUS

## 2017-08-08 MED ORDER — SENNA 8.6 MG PO TABS
2.0000 | ORAL_TABLET | Freq: Every evening | ORAL | Status: DC | PRN
  Administered 2017-08-08: 17.2 mg via ORAL
  Filled 2017-08-08: qty 2

## 2017-08-08 MED ORDER — PROMETHAZINE HCL 25 MG PO TABS
25.0000 mg | ORAL_TABLET | Freq: Four times a day (QID) | ORAL | Status: DC | PRN
Start: 2017-08-08 — End: 2017-08-09

## 2017-08-08 MED ORDER — MIDAZOLAM HCL 2 MG/2ML IJ SOLN
INTRAMUSCULAR | Status: AC
Start: 2017-08-08 — End: ?
  Filled 2017-08-08: qty 2

## 2017-08-08 MED ORDER — ASPIRIN 81 MG PO CHEW
81.0000 mg | CHEWABLE_TABLET | Freq: Every day | ORAL | Status: DC
  Filled 2017-08-08: qty 1

## 2017-08-08 MED ORDER — ONDANSETRON HCL 4 MG/2ML IJ SOLN: 4.0000 mg | Freq: Four times a day (QID) | INTRAMUSCULAR | Status: DC | PRN

## 2017-08-08 MED ORDER — ISOSORBIDE MONONITRATE ER 60 MG PO TB24
60.0000 mg | ORAL_TABLET | Freq: Every day | ORAL | Status: DC
  Administered 2017-08-08 – 2017-08-09 (×2): 60 mg via ORAL
  Filled 2017-08-08 (×2): qty 1

## 2017-08-08 MED ORDER — TRAMADOL HCL 50 MG PO TABS
50.0000 mg | ORAL_TABLET | Freq: Four times a day (QID) | ORAL | Status: DC | PRN
Start: 2017-08-08 — End: 2017-08-09
  Administered 2017-08-08 (×3): 50 mg via ORAL
  Filled 2017-08-08 (×2): qty 1

## 2017-08-08 MED ORDER — PANTOPRAZOLE SODIUM 40 MG PO TBEC
40.0000 mg | DELAYED_RELEASE_TABLET | Freq: Every day | ORAL | Status: DC
  Administered 2017-08-09: 40 mg via ORAL
  Filled 2017-08-08: qty 1

## 2017-08-08 MED ORDER — ASPIRIN 81 MG PO CHEW
81.0000 mg | CHEWABLE_TABLET | ORAL | Status: AC
  Administered 2017-08-08: 81 mg via ORAL

## 2017-08-08 MED ORDER — POTASSIUM CHLORIDE CRYS ER 10 MEQ PO TBCR
20.0000 meq | EXTENDED_RELEASE_TABLET | Freq: Every day | ORAL | Status: DC
  Administered 2017-08-08 – 2017-08-09 (×2): 20 meq via ORAL
  Filled 2017-08-08 (×3): qty 2

## 2017-08-08 SURGICAL SUPPLY — 15 items
BALLN TREK RX 2.25X20 (BALLOONS) ×3
BALLOON TREK RX 2.25X20 (BALLOONS) ×2 IMPLANT
CATH INFINITI 5FR JL4 (CATHETERS) ×3 IMPLANT
CATH VISTA GUIDE 6FR JR4 SH (CATHETERS) ×3 IMPLANT
DEVICE CLOSURE MYNXGRIP 6/7F (Vascular Products) ×3 IMPLANT
DEVICE INFLAT 30 PLUS (MISCELLANEOUS) ×3 IMPLANT
KIT MANI 3VAL PERCEP (MISCELLANEOUS) ×3 IMPLANT
NEEDLE PERC 18GX7CM (NEEDLE) ×3 IMPLANT
PACK CARDIAC CATH (CUSTOM PROCEDURE TRAY) ×3 IMPLANT
SHEATH AVANTI 5FR X 11CM (SHEATH) IMPLANT
SHEATH AVANTI 6FR X 11CM (SHEATH) ×3 IMPLANT
STENT RESOLUTE ONYX 2.5X12 (Permanent Stent) ×3 IMPLANT
STENT RESOLUTE ONYX 2.5X26 (Permanent Stent) ×3 IMPLANT
WIRE ASAHI PROWATER 180CM (WIRE) ×3 IMPLANT
WIRE GUIDERIGHT .035X150 (WIRE) ×3 IMPLANT

## 2017-08-08 NOTE — Care Management Obs Status (Signed)
[place]   Patient Details  Name: NAVIE LAMOREAUX MRN: [id number] Date of Birth: April 21, 1946   Medicare Observation Status Notification Given:  Yes    Katrina Stack, RN 08/08/2017, 4:03 PM

## 2017-08-08 NOTE — Progress Notes (Signed)
Patient states pain in groin is much improved after pain medication. Lunch ordered and delivered from cafeteria. Patient appreciative. Have contacted [facility] on 2A regarding status of room becoming open. Will continue to keep patient updated.

## 2017-08-09 ENCOUNTER — Encounter: Payer: Self-pay | Admitting: Cardiology

## 2017-08-09 DIAGNOSIS — R079 Chest pain, unspecified: Secondary | ICD-10-CM | POA: Diagnosis not present

## 2017-08-09 LAB — CBC
HCT: 30.1 % — ABNORMAL LOW (ref 35.0–47.0)
HEMOGLOBIN: 10 g/dL — AB (ref 12.0–16.0)
MCH: 30.6 pg (ref 26.0–34.0)
MCHC: 33.1 g/dL (ref 32.0–36.0)
MCV: 92.7 fL (ref 80.0–100.0)
PLATELETS: 226 10*3/uL (ref 150–440)
RBC: 3.25 MIL/uL — AB (ref 3.80–5.20)
RDW: 15 % — ABNORMAL HIGH (ref 11.5–14.5)
WBC: 6.6 10*3/uL (ref 3.6–11.0)

## 2017-08-09 LAB — BASIC METABOLIC PANEL
ANION GAP: 6 (ref 5–15)
BUN: 23 mg/dL — ABNORMAL HIGH (ref 6–20)
CALCIUM: 8.4 mg/dL — AB (ref 8.9–10.3)
CO2: 23 mmol/L (ref 22–32)
CREATININE: 1.52 mg/dL — AB (ref 0.44–1.00)
Chloride: 111 mmol/L (ref 101–111)
GFR, EST AFRICAN AMERICAN: 39 mL/min — AB (ref >60)
GFR, EST NON AFRICAN AMERICAN: 34 mL/min — AB (ref >60)
Glucose, Bld: 96 mg/dL (ref 65–99)
Potassium: 3.9 mmol/L (ref 3.5–5.1)
Sodium: 140 mmol/L (ref 135–145)

## 2017-08-09 NOTE — Progress Notes (Signed)
72 year old female who presented to [facility] yesterday for an Outpatient Cath   The patient underwent cardiac catheterization 08/08/2017 which revealed patent stent left main and 80% in-stent restenosis mid RCA, and 70% stenosis proximal RCA.  The patient underwent PCI, receiving 2.5 x 26 mm Resolute Onyx stent in the mid to distal RCA, and 2.5 x 12 mm Resolute Onyx stent in the proximal segment of the right coronary artery.  Patient referred to Cardiac Cath with dx of coronary stent.    Rounded on patient to discuss Cardiac Rehab and modifiable risk factors.  Patient was here in February for an outpatient cath with PCI/Coronary Stent. At that time patient reported she was unable to participate in Cardiac Rehab due to spinal stenosis and bulging discs.  Today patient is reporting she is continuing to have back problems and is s/p a total of four epidural injections.  Patient has the most difficulty in standing to cook. Patient expressed concern for her heart condition, as she has had a total of 8 stents, and concern for her back.  Patient stated she is fearful of having back surgery, as she does not want to become paralyzed.  Allowed patient to ventilate feelings.   Patient informed this RN that she did participate in Cardiac Rehab in 2015 after having had a stent placement.  Patient stated she enjoyed Cardiac Rehab and feels she benefited from participating.   Explained to patient the order for Cardiac Rehab is good for one year should things change with her back.  Patient requested a card or brochure.  Cardiac Rehab brochure provided.      Discussed modifiable risk factors with patient.  Patient is a non-smoker.  Encouraged patient to be as active as she possibly can be.    Roanna Epley, RN, BSN, [facility] Cardiovascular and Pulmonary Nurse Navigator

## 2017-08-09 NOTE — Discharge Summary (Signed)
Physician Discharge Summary  Patient ID: [name] MRN: [id number] DOB/AGE: 07/17/1945 72 y.o.  Admit date: 08/08/2017 Discharge date: 08/09/2017  Primary Discharge Diagnosis Chest pain Secondary Discharge Diagnosis Coronary artery disease  Significant Diagnostic Studies: yes  Consults: None  Hospital Course: The patient underwent cardiac catheterization 08/08/2017 which revealed patent stent left main and 80% in-stent restenosis mid RCA, and 70% stenosis proximal RCA.  The patient underwent PCI, receiving 2.5 x 26 mm Resolute Onyx stent in the mid to distal RCA, and 2.5 x 12 mm Resolute Onyx stent in the proximal segment of the right coronary artery.  The patient was returned to telemetry where she had an uncomplicated hospital stay.  On the morning of 08/09/2017, the patient reports that she was doing well without chest pain.   Discharge Exam: Blood pressure (!) 102/59, pulse 73, temperature 98.3 F (36.8 C), temperature source Oral, resp. rate 18, height 5' (1.524 m), weight 45.8 kg (101 lb), last menstrual period 11/03/1980, SpO2 96 %.   General appearance: alert Head: Normocephalic, without obvious abnormality, atraumatic Eyes: conjunctivae/corneas clear. PERRL, EOM's intact. Fundi benign. Ears: normal TM's and external ear canals both ears Nose: Nares normal. Septum midline. Mucosa normal. No drainage or sinus tenderness. Throat: lips, mucosa, and tongue normal; teeth and gums normal Neck: no adenopathy, no carotid bruit, no JVD, supple, symmetrical, trachea midline and thyroid not enlarged, symmetric, no tenderness/mass/nodules Back: symmetric, no curvature. ROM normal. No CVA tenderness. Resp: clear to auscultation bilaterally Chest wall: no tenderness Cardio: regular rate and rhythm, S1, S2 normal, no murmur, click, rub or gallop GI: soft, non-tender; bowel sounds normal; no masses,  no organomegaly Extremities: extremities normal, atraumatic, no cyanosis or  edema Pulses: 2+ and symmetric Labs:   Lab Results  Component Value Date   WBC 6.6 08/09/2017   HGB 10.0 (L) 08/09/2017   HCT 30.1 (L) 08/09/2017   MCV 92.7 08/09/2017   PLT 226 08/09/2017    Recent Labs  Lab 08/09/17 0409  NA 140  K 3.9  CL 111  CO2 23  BUN 23*  CREATININE 1.52*  CALCIUM 8.4*  GLUCOSE 96      Radiology:  EKG:  NSR  FOLLOW UP PLANS AND APPOINTMENTS Discharge Instructions    Amb Referral to Cardiac Rehabilitation   Complete by:  As directed    Diagnosis:  Coronary Stents     Allergies as of 08/09/2017      Reactions   Prochlorperazine Nausea And Vomiting, Other (See Comments)   Severe vomiting   Ezetimibe Other (See Comments)   Muscle Pain   Statins Other (See Comments)   Muscle Pain   Tape Other (See Comments)   Pulls my skin off when the tape is removed. Use paper tape only.   Cephalexin Rash   Metoclopramide Rash, Other (See Comments)   Elevates BP, face draws to one side   Penicillin G Rash   Penicillins Rash, Other (See Comments)   Has patient had a PCN reaction causing immediate rash, facial/tongue/throat swelling, SOB or lightheadedness with hypotension: Yes Has patient had a PCN reaction causing severe rash involving mucus membranes or skin necrosis: Yes Has patient had a PCN reaction that required hospitalization No Has patient had a PCN reaction occurring within the last 10 years: No If all of the above answers are "NO", then may proceed with Cephalosporin use.      Medication List    TAKE these medications   allopurinol 100 MG tablet Commonly known  as:  ZYLOPRIM Take 100 mg by mouth daily.   aspirin 325 MG tablet Take 325 mg by mouth daily with lunch.   AZO-CRANBERRY PO Take 1 capsule by mouth daily.   clopidogrel 75 MG tablet Commonly known as:  PLAVIX Take 75 mg by mouth daily   CVS STOOL SOFTENER 50 MG capsule Generic drug:  docusate sodium Take 100 mg by mouth daily.   furosemide 40 MG tablet Commonly known  as:  LASIX Take 20 mg by mouth daily.   hyoscyamine 0.125 MG SL tablet Commonly known as:  LEVSIN SL Take 0.125 mg by mouth 2 (two) times daily before lunch and supper.   isosorbide mononitrate 60 MG 24 hr tablet Commonly known as:  IMDUR TAKE 60 MG BY MOUTH EVERY MORNING   MAGNESIUM CITRATE PO Take 100 mg by mouth daily.   metoprolol succinate 25 MG 24 hr tablet Commonly known as:  TOPROL-XL Take 25 mg by mouth daily with lunch.   nitroGLYCERIN 0.4 MG SL tablet Commonly known as:  NITROSTAT Place 0.4 mg under the tongue every 5 (five) minutes as needed for chest pain.   pantoprazole 40 MG tablet Commonly known as:  PROTONIX Take 40 mg by mouth daily.   potassium chloride 10 MEQ CR capsule Commonly known as:  MICRO-K Take 20 mEq by mouth daily.   promethazine 25 MG tablet Commonly known as:  PHENERGAN Take 25 mg by mouth every 8 (eight) hours as needed for nausea or vomiting.   riboflavin 100 MG Tabs tablet Commonly known as:  VITAMIN B-2 Take 100 mg by mouth daily.   senna 8.6 MG tablet Commonly known as:  SENOKOT Take 6 tablets by mouth at bedtime.   tiZANidine 2 MG tablet Commonly known as:  ZANAFLEX Take 2 mg by mouth 3 (three) times daily as needed for muscle spasms.   TYLENOL 500 MG tablet Generic drug:  acetaminophen Take 1,000 mg by mouth every 6 (six) hours as needed for moderate pain or headache.      Follow-up Information    Othell Diluzio, MD Follow up in 1 week(s).   Specialty:  Cardiology Contact information: [facility] [place] [address] [phone]           BRING ALL MEDICATIONS WITH YOU TO FOLLOW UP APPOINTMENTS  Time spent with patient to include physician time: 25 min Signed:  Isaias Cowman MD, PhD, [facility] 08/09/2017, 7:27 AM

## 2017-08-09 NOTE — Progress Notes (Signed)
Discharge instructions explained to pt/ verbalized an understanding/ iv and tele removed/ will transport off unit via wheelchair.  

## 2018-11-24 NOTE — Progress Notes (Signed)
[name]  Telephone:(336) [phone] Fax:(336) [phone]  ID: Ellene Route OB: 24-Oct-1945  MR#: [id number]  KGU#:[id number]  Patient Care Team: Tracie Harrier, MD as PCP - General (Internal Medicine)  CHIEF COMPLAINT: Iron deficiency anemia  INTERVAL HISTORY: Patient last evaluated in April 2018 and is referred back for declining hemoglobin.  Patient complains of chronic weakness and fatigue, but otherwise feels well.  She has no neurologic complaints.  She denies any recent fevers or illnesses.  She has good appetite and denies weight loss.  She has no chest pain, shortness of breath, cough, or hemoptysis.  She denies any nausea, vomiting, constipation, or diarrhea.  She has no melena or hematochezia.  She has no urinary complaints.  Patient offers no further specific complaints today.  REVIEW OF SYSTEMS:   Review of Systems  Constitutional: Positive for malaise/fatigue. Negative for fever and weight loss.  Respiratory: Negative.  Negative for cough, hemoptysis and shortness of breath.   Cardiovascular: Negative.  Negative for chest pain and leg swelling.  Gastrointestinal: Negative.  Negative for abdominal pain, blood in stool and melena.  Genitourinary: Negative.  Negative for dysuria and hematuria.  Musculoskeletal: Negative.  Negative for back pain.  Skin: Negative.  Negative for rash.  Neurological: Positive for weakness. Negative for dizziness, focal weakness and headaches.  Psychiatric/Behavioral: Negative.  The patient is not nervous/anxious.     As per HPI. Otherwise, a complete review of systems is negative.  PAST MEDICAL HISTORY: Past Medical History:  Diagnosis Date  . Acid reflux   . Anemia   . CAD (coronary artery disease)   . Chronic constipation   . Chronic kidney disease    stage 3 chronic kidney disease  . Eczema   . H/O heart artery stent   . Hyperlipidemia   . Hypertension   . IBS (irritable bowel syndrome) 2004  . Migraine    . Mitral valve prolapse   . Peptic ulcer 1989  . Skin cancer     PAST SURGICAL HISTORY: Past Surgical History:  Procedure Laterality Date  . ABDOMINAL HYSTERECTOMY    . CARDIAC CATHETERIZATION N/A 10/21/2015   Procedure: Left Heart Cath and Coronary Angiography;  Surgeon: Isaias Cowman, MD;  Location: [place] [facility];  Service: Cardiovascular;  Laterality: N/A;  . CARDIAC CATHETERIZATION N/A 10/21/2015   Procedure: Coronary Stent Intervention;  Surgeon: Isaias Cowman, MD;  Location: [place] [facility];  Service: Cardiovascular;  Laterality: N/A;  . CARDIAC CATHETERIZATION Left 11/24/2015   Procedure: Coronary Stent Intervention;  Surgeon: Isaias Cowman, MD;  [place] [facility] [facility];  Service: Cardiovascular;  Laterality: Left;  . CARDIAC SURGERY  2007   cardiac stent place  . CATARACT EXTRACTION  2012  . CHOLECYSTECTOMY    . CORONARY ANGIOPLASTY WITH STENT PLACEMENT  2013  . CORONARY ARTERY BYPASS GRAFT     triple  . CORONARY STENT INTERVENTION N/A 07/04/2017   Procedure: CORONARY STENT INTERVENTION;  Surgeon: Isaias Cowman, MD;  Location: [place] [facility];  Service: Cardiovascular;  Laterality: N/A;  . CORONARY STENT INTERVENTION N/A 08/08/2017   Procedure: CORONARY STENT INTERVENTION;  Surgeon: Isaias Cowman, MD;  Location: [place] [facility];  Service: Cardiovascular;  Laterality: N/A;  . CYSTOSCOPY W/ RETROGRADES Bilateral 12/29/2014   Procedure: CYSTOSCOPY WITH RETROGRADE PYELOGRAM;  Surgeon: Collier Flowers, MD;  Location: [facility];  Service: Urology;  Laterality: Bilateral;  . CYSTOSCOPY WITH BIOPSY N/A 12/29/2014   Procedure: CYSTOSCOPY WITH BIOPSY;  Surgeon: Collier Flowers,  MD;  Location: [facility];  Service: Urology;  Laterality: N/A;  . HAND SURGERY  1998  . LEFT HEART CATH AND CORONARY ANGIOGRAPHY Left 08/08/2017   Procedure: LEFT HEART CATH AND CORONARY ANGIOGRAPHY;  Surgeon: Isaias Cowman, MD;  Location: [place] [facility];  Service: Cardiovascular;  Laterality: Left;  . LEFT HEART CATH AND CORS/GRAFTS ANGIOGRAPHY N/A 07/04/2017   Procedure: LEFT HEART CATH AND CORS/GRAFTS ANGIOGRAPHY;  Surgeon: Isaias Cowman, MD;  Location: [place] [facility];  Service: Cardiovascular;  Laterality: N/A;  . SIGMOID RESECTION / RECTOPEXY  2006  . SKIN CANCER EXCISION  2013  . TENDON REPAIR     right elbow  . TRIGGER FINGER RELEASE      FAMILY HISTORY: Family History  Problem Relation Age of Onset  . Kidney Stones Father   . Prostate cancer Father   . Heart disease Father   . Kidney Stones Brother   . Heart disease Mother     ADVANCED DIRECTIVES (Y/N):  N  HEALTH MAINTENANCE: Social History   Tobacco Use  . Smoking status: Never Smoker  . Smokeless tobacco: Never Used  Substance Use Topics  . Alcohol use: No    Alcohol/week: 0.0 standard drinks  . Drug use: No     Colonoscopy:  PAP:  Bone density:  Lipid panel:  Allergies  Allergen Reactions  . Prochlorperazine Nausea And Vomiting, Other (See Comments) and Nausea Only    Severe vomiting  . Ezetimibe Other (See Comments)    Muscle Pain  . Statins Other (See Comments)    Muscle Pain  . Tape Other (See Comments)    Pulls my skin off when the tape is removed. Use paper tape only.  . Cephalexin Rash  . Metoclopramide Rash and Other (See Comments)    Elevates BP, face draws to one side  . Penicillin G Rash  . Penicillins Rash and Other (See Comments)    Has patient had a PCN reaction causing immediate rash, facial/tongue/throat swelling, SOB or lightheadedness with hypotension: Yes Has patient had a PCN reaction causing severe rash involving mucus membranes or skin necrosis: Yes Has patient had a PCN reaction that required hospitalization No Has patient had a PCN reaction occurring within the last 10 years: No If all of the above answers are "NO", then may proceed with Cephalosporin use.     Current Outpatient Medications  Medication  Sig Dispense Refill  . acetaminophen (TYLENOL) 500 MG tablet Take 1,000 mg by mouth every 6 (six) hours as needed for moderate pain or headache.     . allopurinol (ZYLOPRIM) 100 MG tablet Take 100 mg by mouth daily.    Marland Kitchen aspirin 325 MG tablet Take 325 mg by mouth daily with lunch.     . AZO-CRANBERRY PO Take 1 capsule by mouth daily.    . bisacodyl (DULCOLAX) 5 MG EC tablet Take 1 tablet by mouth 1 day or 1 dose.    . Calcium Carbonate-Vitamin D 600-400 MG-UNIT tablet Take 1 tablet by mouth 1 day or 1 dose.    . clindamycin (CLEOCIN) 300 MG capsule Take 1 capsule by mouth 1 day or 1 dose.    . clopidogrel (PLAVIX) 75 MG tablet Take 75 mg by mouth daily    . clotrimazole-betamethasone (LOTRISONE) cream Apply 1 application topically 2 (two) times a day.    Marland Kitchen dexlansoprazole (DEXILANT) 60 MG capsule Take 1 capsule by mouth 1 day or 1 dose.    . docusate sodium ([employer] STOOL  SOFTENER) 50 MG capsule Take 100 mg by mouth daily.     . furosemide (LASIX) 40 MG tablet Take 20 mg by mouth daily.    Marland Kitchen gabapentin (NEURONTIN) 100 MG capsule Take 1 capsule by mouth at bedtime.    . hyoscyamine (LEVSIN SL) 0.125 MG SL tablet Take 0.125 mg by mouth 2 (two) times daily before lunch and supper.     Vanessa Kick Ethyl 0.5 g CAPS Take 1 capsule by mouth 1 day or 1 dose.    . isosorbide mononitrate (IMDUR) 60 MG 24 hr tablet TAKE 60 MG BY MOUTH EVERY MORNING    . MAGNESIUM CITRATE PO Take 100 mg by mouth daily.     . Magnesium Oxide 500 MG (LAX) TABS Take 1 tablet by mouth 1 day or 1 dose.    . metoprolol succinate (TOPROL-XL) 25 MG 24 hr tablet Take 25 mg by mouth daily with lunch.     . nitroGLYCERIN (NITROSTAT) 0.4 MG SL tablet Place 0.4 mg under the tongue every 5 (five) minutes as needed for chest pain.     . pantoprazole (PROTONIX) 40 MG tablet Take 40 mg by mouth daily.    . potassium chloride (MICRO-K) 10 MEQ CR capsule Take 20 mEq by mouth daily.     . promethazine (PHENERGAN) 25 MG tablet Take 25 mg by mouth  every 8 (eight) hours as needed for nausea or vomiting.     . riboflavin (VITAMIN B-2) 100 MG TABS tablet Take 100 mg by mouth daily.    Marland Kitchen senna (SENOKOT) 8.6 MG tablet Take 6 tablets by mouth at bedtime.     Marland Kitchen tiZANidine (ZANAFLEX) 2 MG tablet Take 2 mg by mouth 3 (three) times daily as needed for muscle spasms.  1  . vitamin E 400 UNIT capsule Take 1 capsule by mouth 1 day or 1 dose.     No current facility-administered medications for this visit.     OBJECTIVE: Vitals:   11/27/18 1028  BP: 123/72  Pulse: 80  Temp: 97.7 F (36.5 C)     Body mass index is 20.7 kg/m.    ECOG FS:0 - Asymptomatic  General: Well-developed, well-nourished, no acute distress. Eyes: Pink conjunctiva, anicteric sclera. HEENT: Normocephalic, moist mucous membranes, clear oropharnyx. Lungs: Clear to auscultation bilaterally. Heart: Regular rate and rhythm. No rubs, murmurs, or gallops. Abdomen: Soft, nontender, nondistended. No organomegaly noted, normoactive bowel sounds. Musculoskeletal: No edema, cyanosis, or clubbing. Neuro: Alert, answering all questions appropriately. Cranial nerves grossly intact. Skin: No rashes or petechiae noted. Psych: Normal affect. Lymphatics: No cervical, calvicular, axillary or inguinal LAD.   LAB RESULTS:  Lab Results  Component Value Date   NA 140 08/09/2017   K 3.9 08/09/2017   CL 111 08/09/2017   CO2 23 08/09/2017   GLUCOSE 96 08/09/2017   BUN 23 (H) 08/09/2017   CREATININE 1.52 (H) 08/09/2017   CALCIUM 8.4 (L) 08/09/2017   PROT 7.0 11/30/2006   ALBUMIN 4.4 11/30/2006   AST 19 11/30/2006   ALT 11 11/30/2006   ALKPHOS 51 11/30/2006   BILITOT 0.7 11/30/2006   GFRNONAA 34 (L) 08/09/2017   GFRAA 39 (L) 08/09/2017    Lab Results  Component Value Date   WBC 5.5 11/27/2018   NEUTROABS 2.8 11/27/2018   HGB 11.2 (L) 11/27/2018   HCT 34.9 (L) 11/27/2018   MCV 91.8 11/27/2018   PLT 292 11/27/2018   Lab Results  Component Value Date   IRON 66 11/27/2018  TIBC 322 11/27/2018   IRONPCTSAT 21 11/27/2018   Lab Results  Component Value Date   FERRITIN 31 11/27/2018     STUDIES: No results found.  ASSESSMENT: Iron deficiency anemia  PLAN:    1. Iron deficiency anemia: Although patient's hemoglobin is decreased, it is slightly trending up.  Her iron stores are within normal limits, but borderline low.  Because she is symptomatic, we will proceed with one infusion of 510 mg IV Feraheme today.  She does not require second infusion.  Patient does not remember when her last colonoscopy was, and have recommended that she have her primary care give her referral to GI.  No further interventions are needed.  Return to clinic in 4 months with repeat laboratory work and further evaluation.  I spent a total of 30 minutes face-to-face with the patient of which greater than 50% of the visit was spent in counseling and coordination of care as detailed above.   Patient expressed understanding and was in agreement with this plan. She also understands that She can call clinic at any time with any questions, concerns, or complaints.    Lloyd Huger, MD   11/28/2018 10:16 AM

## 2018-11-27 ENCOUNTER — Inpatient Hospital Stay (HOSPITAL_BASED_OUTPATIENT_CLINIC_OR_DEPARTMENT_OTHER): Payer: Medicare Other | Admitting: Oncology

## 2018-11-27 ENCOUNTER — Inpatient Hospital Stay: Payer: Medicare Other | Attending: Oncology

## 2018-11-27 ENCOUNTER — Inpatient Hospital Stay: Payer: Medicare Other

## 2018-11-27 ENCOUNTER — Other Ambulatory Visit: Payer: Self-pay

## 2018-11-27 ENCOUNTER — Encounter: Payer: Self-pay | Admitting: Oncology

## 2018-11-27 VITALS — BP 123/72 | HR 80 | Temp 97.7°F | Ht 60.0 in | Wt 106.0 lb

## 2018-11-27 DIAGNOSIS — Z955 Presence of coronary angioplasty implant and graft: Secondary | ICD-10-CM | POA: Insufficient documentation

## 2018-11-27 DIAGNOSIS — Z8042 Family history of malignant neoplasm of prostate: Secondary | ICD-10-CM | POA: Insufficient documentation

## 2018-11-27 DIAGNOSIS — Z85828 Personal history of other malignant neoplasm of skin: Secondary | ICD-10-CM

## 2018-11-27 DIAGNOSIS — D509 Iron deficiency anemia, unspecified: Secondary | ICD-10-CM

## 2018-11-27 DIAGNOSIS — Z888 Allergy status to other drugs, medicaments and biological substances status: Secondary | ICD-10-CM | POA: Diagnosis not present

## 2018-11-27 DIAGNOSIS — Z79899 Other long term (current) drug therapy: Secondary | ICD-10-CM

## 2018-11-27 DIAGNOSIS — K219 Gastro-esophageal reflux disease without esophagitis: Secondary | ICD-10-CM

## 2018-11-27 DIAGNOSIS — Z8249 Family history of ischemic heart disease and other diseases of the circulatory system: Secondary | ICD-10-CM | POA: Insufficient documentation

## 2018-11-27 DIAGNOSIS — Z951 Presence of aortocoronary bypass graft: Secondary | ICD-10-CM | POA: Diagnosis not present

## 2018-11-27 DIAGNOSIS — N183 Chronic kidney disease, stage 3 (moderate): Secondary | ICD-10-CM | POA: Insufficient documentation

## 2018-11-27 DIAGNOSIS — D508 Other iron deficiency anemias: Secondary | ICD-10-CM

## 2018-11-27 DIAGNOSIS — R531 Weakness: Secondary | ICD-10-CM | POA: Insufficient documentation

## 2018-11-27 DIAGNOSIS — Z880 Allergy status to penicillin: Secondary | ICD-10-CM | POA: Insufficient documentation

## 2018-11-27 DIAGNOSIS — Z8711 Personal history of peptic ulcer disease: Secondary | ICD-10-CM | POA: Insufficient documentation

## 2018-11-27 DIAGNOSIS — Z881 Allergy status to other antibiotic agents status: Secondary | ICD-10-CM | POA: Insufficient documentation

## 2018-11-27 DIAGNOSIS — Z841 Family history of disorders of kidney and ureter: Secondary | ICD-10-CM | POA: Insufficient documentation

## 2018-11-27 DIAGNOSIS — R5383 Other fatigue: Secondary | ICD-10-CM

## 2018-11-27 LAB — CBC WITH DIFFERENTIAL/PLATELET
Abs Immature Granulocytes: 0.06 10*3/uL (ref 0.00–0.07)
Basophils Absolute: 0.1 10*3/uL (ref 0.0–0.1)
Basophils Relative: 2 %
Eosinophils Absolute: 0.5 10*3/uL (ref 0.0–0.5)
Eosinophils Relative: 10 %
HCT: 34.9 % — ABNORMAL LOW (ref 36.0–46.0)
Hemoglobin: 11.2 g/dL — ABNORMAL LOW (ref 12.0–15.0)
Immature Granulocytes: 1 %
Lymphocytes Relative: 27 %
Lymphs Abs: 1.5 10*3/uL (ref 0.7–4.0)
MCH: 29.5 pg (ref 26.0–34.0)
MCHC: 32.1 g/dL (ref 30.0–36.0)
MCV: 91.8 fL (ref 80.0–100.0)
Monocytes Absolute: 0.6 10*3/uL (ref 0.1–1.0)
Monocytes Relative: 10 %
Neutro Abs: 2.8 10*3/uL (ref 1.7–7.7)
Neutrophils Relative %: 50 %
Platelets: 292 10*3/uL (ref 150–400)
RBC: 3.8 MIL/uL — ABNORMAL LOW (ref 3.87–5.11)
RDW: 14.5 % (ref 11.5–15.5)
WBC: 5.5 10*3/uL (ref 4.0–10.5)
nRBC: 0 % (ref 0.0–0.2)

## 2018-11-27 LAB — IRON AND TIBC
Iron: 66 ug/dL (ref 28–170)
Saturation Ratios: 21 % (ref 10.4–31.8)
TIBC: 322 ug/dL (ref 250–450)
UIBC: 256 ug/dL

## 2018-11-27 LAB — FERRITIN: Ferritin: 31 ng/mL (ref 11–307)

## 2018-11-27 MED ORDER — SODIUM CHLORIDE 0.9 % IV SOLN
510.0000 mg | Freq: Once | INTRAVENOUS | Status: AC
  Administered 2018-11-27: 11:00:00 510 mg via INTRAVENOUS
  Filled 2018-11-27: qty 17

## 2018-11-27 MED ORDER — SODIUM CHLORIDE 0.9 % IV SOLN
Freq: Once | INTRAVENOUS | Status: AC
  Administered 2018-11-27: 11:00:00 via INTRAVENOUS
  Filled 2018-11-27: qty 250

## 2018-11-27 NOTE — Progress Notes (Signed)
Patient stated that she had been feeling tired, weak, bilateral feet cramping and constipation. Patient also stated that she had gained some weight gain.

## 2019-03-10 DIAGNOSIS — N2581 Secondary hyperparathyroidism of renal origin: Secondary | ICD-10-CM | POA: Insufficient documentation

## 2019-03-10 DIAGNOSIS — I701 Atherosclerosis of renal artery: Secondary | ICD-10-CM | POA: Insufficient documentation

## 2019-04-02 IMAGING — CR DG CHEST 2V
2 series · 2 of 2 positions shown · non-contrast
Comparison: Chest radiograph 12/21/2006

CLINICAL DATA: Chest pain

EXAM:
CHEST - 2 VIEW

[chest pa]
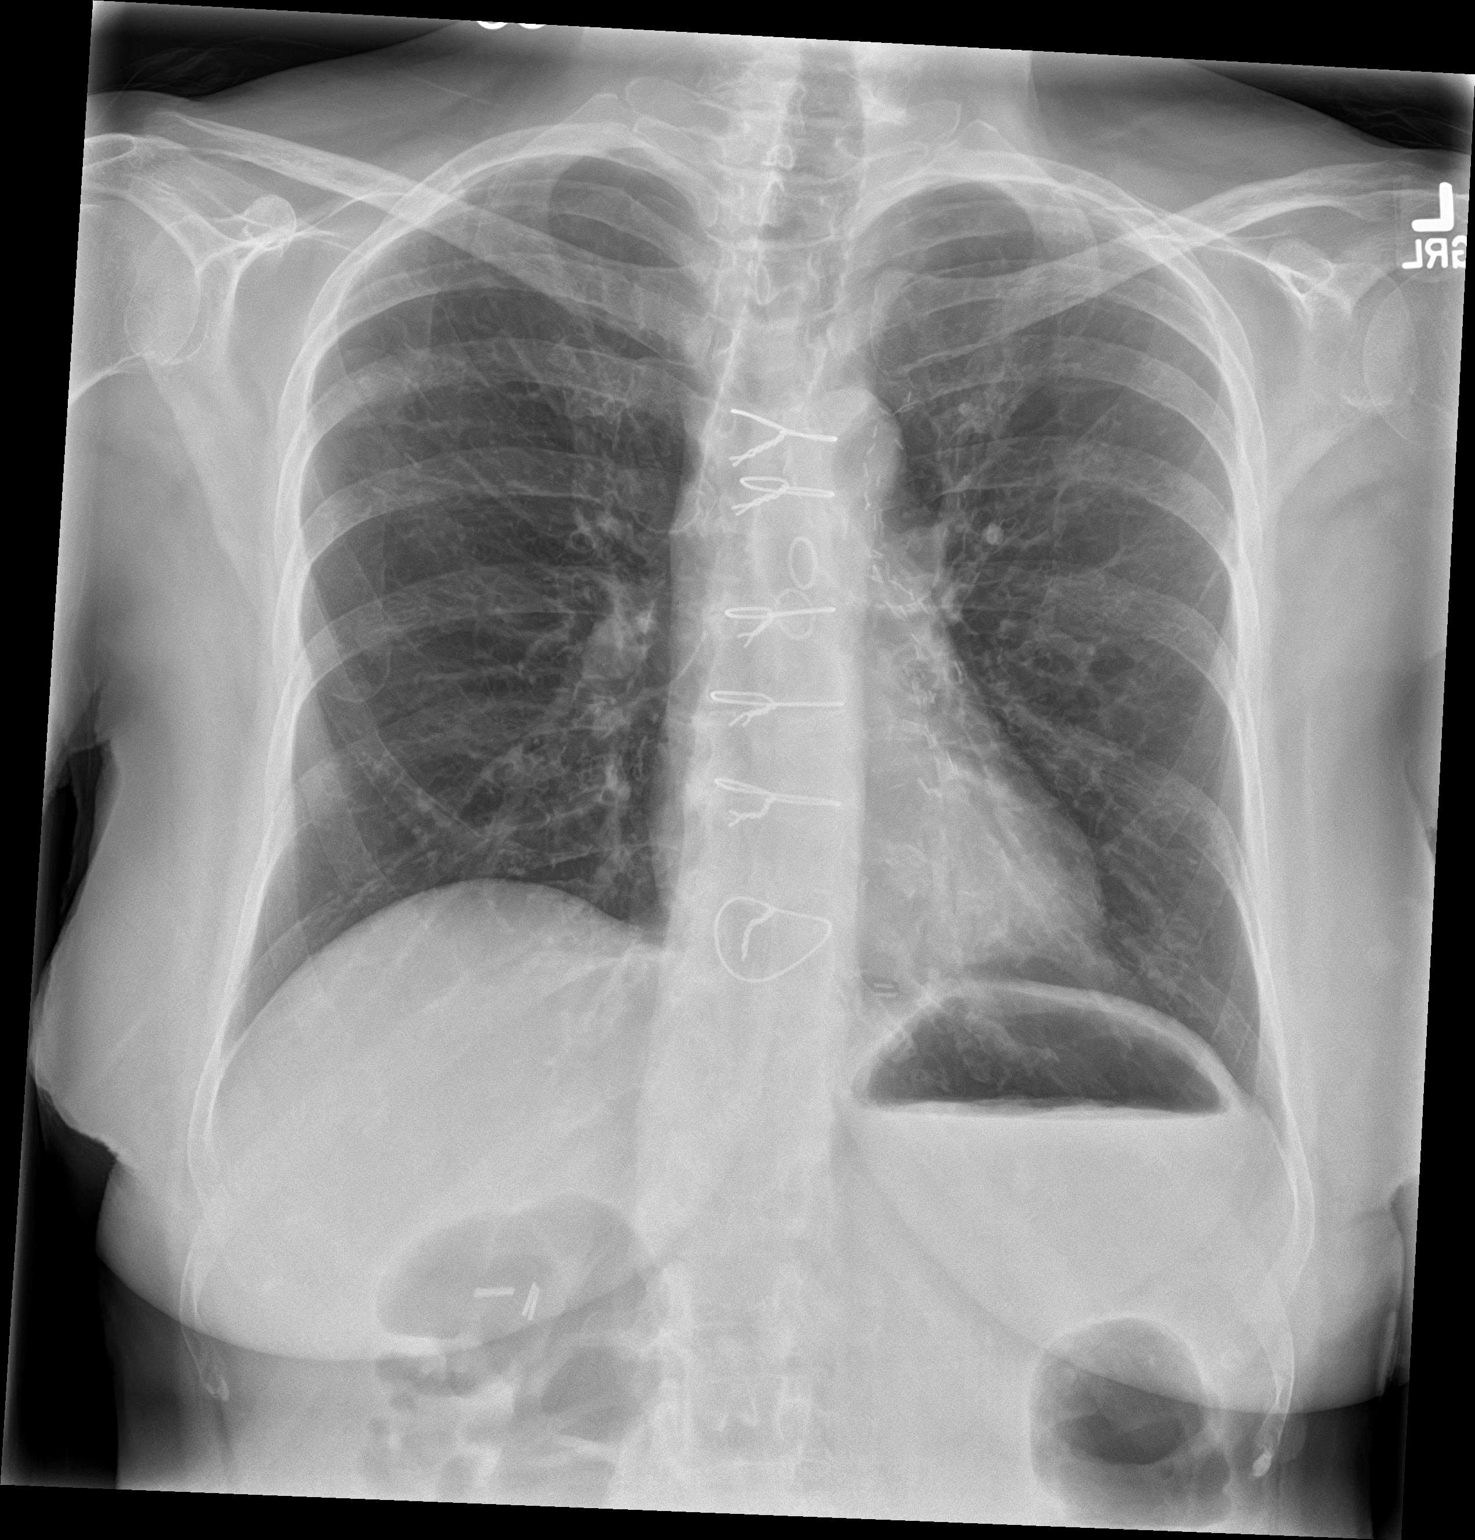

[chest lat]
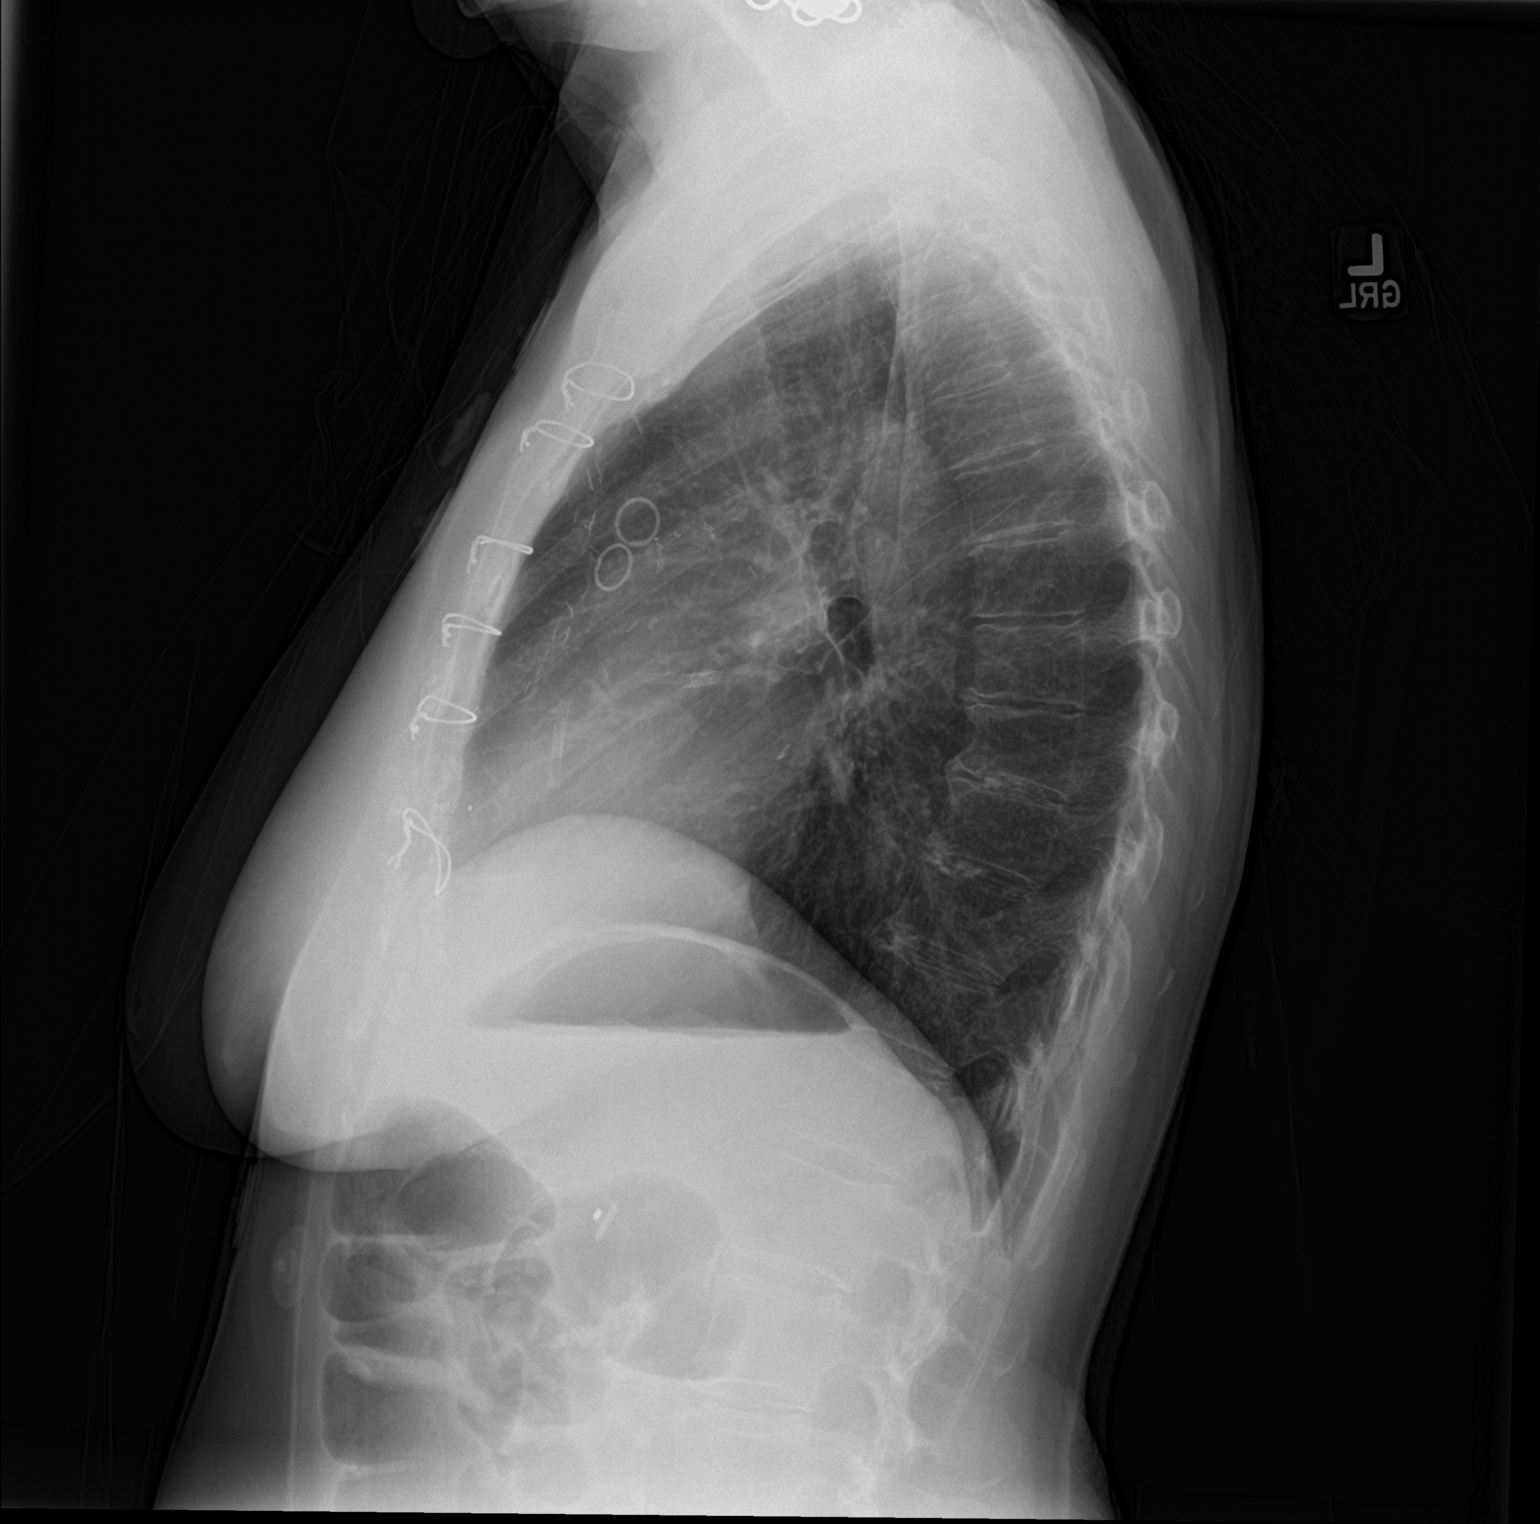

[2 of 2 positions shown; findings below may reference images not displayed]

FINDINGS: Remote median sternotomy for CABG. No focal airspace consolidation
or pulmonary edema. Normal cardiac silhouette. No pleural effusion
or pneumothorax.
IMPRESSION: No active cardiopulmonary disease.

## 2019-04-14 NOTE — Progress Notes (Signed)
[place]  Telephone:(336) [phone] Fax:(336) [phone]  ID: Ellene Route OB: 10/09/1945  MR#: [id number]  VEH#:[id number]  Patient Care Team: Tracie Harrier, MD as PCP - General (Internal Medicine)  CHIEF COMPLAINT: Iron deficiency anemia  INTERVAL HISTORY: Patient returns to clinic today for repeat laboratory work, further evaluation, and consideration of additional IV iron.  She currently feels well and is asymptomatic.  She does not complain of weakness or fatigue today. She has no neurologic complaints.  She denies any recent fevers or illnesses.  She has a good appetite and denies weight loss.  She has no chest pain, shortness of breath, cough, or hemoptysis.  She denies any nausea, vomiting, constipation, or diarrhea.  She has no melena or hematochezia.  She has no urinary complaints.  Patient offers no specific complaints today.  REVIEW OF SYSTEMS:   Review of Systems  Constitutional: Negative.  Negative for fever, malaise/fatigue and weight loss.  Respiratory: Negative.  Negative for cough, hemoptysis and shortness of breath.   Cardiovascular: Negative.  Negative for chest pain and leg swelling.  Gastrointestinal: Negative.  Negative for abdominal pain, blood in stool and melena.  Genitourinary: Negative.  Negative for dysuria and hematuria.  Musculoskeletal: Negative.  Negative for back pain.  Skin: Negative.  Negative for rash.  Neurological: Negative.  Negative for dizziness, focal weakness, weakness and headaches.  Psychiatric/Behavioral: Negative.  The patient is not nervous/anxious.     As per HPI. Otherwise, a complete review of systems is negative.  PAST MEDICAL HISTORY: Past Medical History:  Diagnosis Date  . Acid reflux   . Anemia   . CAD (coronary artery disease)   . Chronic constipation   . Chronic kidney disease    stage 3 chronic kidney disease  . Eczema   . H/O heart artery stent   . Hyperlipidemia   . Hypertension   . IBS  (irritable bowel syndrome) 2004  . Migraine   . Mitral valve prolapse   . Peptic ulcer 1989  . Skin cancer     PAST SURGICAL HISTORY: Past Surgical History:  Procedure Laterality Date  . ABDOMINAL HYSTERECTOMY    . CARDIAC CATHETERIZATION N/A 10/21/2015   Procedure: Left Heart Cath and Coronary Angiography;  Surgeon: Isaias Cowman, MD;  Location: [place] [facility];  Service: Cardiovascular;  Laterality: N/A;  . CARDIAC CATHETERIZATION N/A 10/21/2015   Procedure: Coronary Stent Intervention;  Surgeon: Isaias Cowman, MD;  Location: [place] [facility];  Service: Cardiovascular;  Laterality: N/A;  . CARDIAC CATHETERIZATION Left 11/24/2015   Procedure: Coronary Stent Intervention;  Surgeon: Isaias Cowman, MD;  Location: [place] [facility];  Service: Cardiovascular;  Laterality: Left;  . CARDIAC SURGERY  2007   cardiac stent place  . CATARACT EXTRACTION  2012  . CHOLECYSTECTOMY    . CORONARY ANGIOPLASTY WITH STENT PLACEMENT  2013  . CORONARY ARTERY BYPASS GRAFT     triple  . CORONARY STENT INTERVENTION N/A 07/04/2017   Procedure: CORONARY STENT INTERVENTION;  Surgeon: Isaias Cowman, MD;  Location: [place] [facility];  Service: Cardiovascular;  Laterality: N/A;  . CORONARY STENT INTERVENTION N/A 08/08/2017   Procedure: CORONARY STENT INTERVENTION;  Surgeon: Isaias Cowman, MD;  Location: [place] [facility];  Service: Cardiovascular;  Laterality: N/A;  . CYSTOSCOPY W/ RETROGRADES Bilateral 12/29/2014   Procedure: CYSTOSCOPY WITH RETROGRADE PYELOGRAM;  Surgeon: Collier Flowers, MD;  Location: [facility];  Service: Urology;  Laterality: Bilateral;  . CYSTOSCOPY WITH BIOPSY N/A 12/29/2014  Procedure: CYSTOSCOPY WITH BIOPSY;  Surgeon: Collier Flowers, MD;  Location: [facility];  Service: Urology;  Laterality: N/A;  . HAND SURGERY  1998  . LEFT HEART CATH AND CORONARY ANGIOGRAPHY Left 08/08/2017   Procedure: LEFT HEART CATH AND CORONARY ANGIOGRAPHY;  Surgeon:  Isaias Cowman, MD;  Location: [place] [facility];  Service: Cardiovascular;  Laterality: Left;  . LEFT HEART CATH AND CORS/GRAFTS ANGIOGRAPHY N/A 07/04/2017   Procedure: LEFT HEART CATH AND CORS/GRAFTS ANGIOGRAPHY;  Surgeon: Isaias Cowman, MD;  Location: [place] [facility];  Service: Cardiovascular;  Laterality: N/A;  . SIGMOID RESECTION / RECTOPEXY  2006  . SKIN CANCER EXCISION  2013  . TENDON REPAIR     right elbow  . TRIGGER FINGER RELEASE      FAMILY HISTORY: Family History  Problem Relation Age of Onset  . Kidney Stones Father   . Prostate cancer Father   . Heart disease Father   . Kidney Stones Brother   . Heart disease Mother     ADVANCED DIRECTIVES (Y/N):  N  HEALTH MAINTENANCE: Social History   Tobacco Use  . Smoking status: Never Smoker  . Smokeless tobacco: Never Used  Substance Use Topics  . Alcohol use: No    Alcohol/week: 0.0 standard drinks  . Drug use: No     Colonoscopy:  PAP:  Bone density:  Lipid panel:  Allergies  Allergen Reactions  . Prochlorperazine Nausea And Vomiting, Other (See Comments) and Nausea Only    Severe vomiting  . Ezetimibe Other (See Comments)    Muscle Pain  . Statins Other (See Comments)    Muscle Pain  . Tape Other (See Comments)    Pulls my skin off when the tape is removed. Use paper tape only.  . Cephalexin Rash  . Metoclopramide Rash and Other (See Comments)    Elevates BP, face draws to one side  . Penicillin G Rash  . Penicillins Rash and Other (See Comments)    Has patient had a PCN reaction causing immediate rash, facial/tongue/throat swelling, SOB or lightheadedness with hypotension: Yes Has patient had a PCN reaction causing severe rash involving mucus membranes or skin necrosis: Yes Has patient had a PCN reaction that required hospitalization No Has patient had a PCN reaction occurring within the last 10 years: No If all of the above answers are "NO", then may proceed with Cephalosporin use.      Current Outpatient Medications  Medication Sig Dispense Refill  . acetaminophen (TYLENOL) 500 MG tablet Take 1,000 mg by mouth every 6 (six) hours as needed for moderate pain or headache.     . allopurinol (ZYLOPRIM) 100 MG tablet Take 100 mg by mouth daily.    Marland Kitchen aspirin 325 MG tablet Take 325 mg by mouth daily with lunch.     . AZO-CRANBERRY PO Take 1 capsule by mouth daily.    . bisacodyl (DULCOLAX) 5 MG EC tablet Take 1 tablet by mouth 1 day or 1 dose.    . Calcium Carbonate-Vitamin D 600-400 MG-UNIT tablet Take 1 tablet by mouth 1 day or 1 dose.    . clindamycin (CLEOCIN) 300 MG capsule Take 1 capsule by mouth 1 day or 1 dose.    . clopidogrel (PLAVIX) 75 MG tablet Take 75 mg by mouth daily    . docusate sodium ([employer]S STOOL SOFTENER) 50 MG capsule Take 100 mg by mouth daily.     . furosemide (LASIX) 40 MG tablet Take 20 mg by mouth daily.    Marland Kitchen  gabapentin (NEURONTIN) 100 MG capsule Take 1 capsule by mouth at bedtime.    . hyoscyamine (LEVSIN SL) 0.125 MG SL tablet Take 0.125 mg by mouth 2 (two) times daily before lunch and supper.     Vanessa Kick Ethyl 0.5 g CAPS Take 1 capsule by mouth 1 day or 1 dose.    . isosorbide mononitrate (IMDUR) 60 MG 24 hr tablet TAKE 60 MG BY MOUTH EVERY MORNING    . Magnesium Oxide 500 MG (LAX) TABS Take 1 tablet by mouth 1 day or 1 dose.    . metoprolol succinate (TOPROL-XL) 25 MG 24 hr tablet Take 25 mg by mouth daily with lunch.     . nitroGLYCERIN (NITROSTAT) 0.4 MG SL tablet Place 0.4 mg under the tongue every 5 (five) minutes as needed for chest pain.     . pantoprazole (PROTONIX) 40 MG tablet Take 40 mg by mouth daily.    . potassium chloride (MICRO-K) 10 MEQ CR capsule Take 20 mEq by mouth daily.     . promethazine (PHENERGAN) 25 MG tablet Take 25 mg by mouth every 8 (eight) hours as needed for nausea or vomiting.     . riboflavin (VITAMIN B-2) 100 MG TABS tablet Take 100 mg by mouth daily.    Marland Kitchen senna (SENOKOT) 8.6 MG tablet Take 6 tablets by  mouth at bedtime.     . vitamin E 400 UNIT capsule Take 1 capsule by mouth 1 day or 1 dose.    Marland Kitchen dexlansoprazole (DEXILANT) 60 MG capsule Take 1 capsule by mouth 1 day or 1 dose.    Marland Kitchen MAGNESIUM CITRATE PO Take 100 mg by mouth daily.     Marland Kitchen tiZANidine (ZANAFLEX) 2 MG tablet Take 2 mg by mouth 3 (three) times daily as needed for muscle spasms.  1   No current facility-administered medications for this visit.     OBJECTIVE: Vitals:   04/15/19 1345  BP: 96/64  Pulse: 79  Resp: 18  Temp: (!) 97.2 F (36.2 C)  SpO2: 100%     Body mass index is 19.53 kg/m.    ECOG FS:0 - Asymptomatic  General: Well-developed, well-nourished, no acute distress. Eyes: Pink conjunctiva, anicteric sclera. HEENT: Normocephalic, moist mucous membranes. Lungs: Clear to auscultation bilaterally. Heart: Regular rate and rhythm. No rubs, murmurs, or gallops. Abdomen: Soft, nontender, nondistended. No organomegaly noted, normoactive bowel sounds. Musculoskeletal: No edema, cyanosis, or clubbing. Neuro: Alert, answering all questions appropriately. Cranial nerves grossly intact. Skin: No rashes or petechiae noted. Psych: Normal affect.  LAB RESULTS:  Lab Results  Component Value Date   NA 140 08/09/2017   K 3.9 08/09/2017   CL 111 08/09/2017   CO2 23 08/09/2017   GLUCOSE 96 08/09/2017   BUN 23 (H) 08/09/2017   CREATININE 1.52 (H) 08/09/2017   CALCIUM 8.4 (L) 08/09/2017   PROT 7.0 11/30/2006   ALBUMIN 4.4 11/30/2006   AST 19 11/30/2006   ALT 11 11/30/2006   ALKPHOS 51 11/30/2006   BILITOT 0.7 11/30/2006   GFRNONAA 34 (L) 08/09/2017   GFRAA 39 (L) 08/09/2017    Lab Results  Component Value Date   WBC 6.7 04/15/2019   NEUTROABS 4.1 04/15/2019   HGB 11.2 (L) 04/15/2019   HCT 35.6 (L) 04/15/2019   MCV 93.4 04/15/2019   PLT 282 04/15/2019   Lab Results  Component Value Date   IRON 103 04/15/2019   TIBC 234 (L) 04/15/2019   IRONPCTSAT 44 (H) 04/15/2019   Lab Results  Component Value Date    FERRITIN 197 04/15/2019     STUDIES: No results found.  ASSESSMENT: Iron deficiency anemia  PLAN:    1. Iron deficiency anemia: Patient's hemoglobin remains decreased, but essentially stable.  Her iron stores are now within normal limits.  She does not require additional IV iron.  Patient last received treatment with 510 mg IV Feraheme on November 27, 2018.  Patient's last colonoscopy was greater than 10 years ago, therefore she was given a referral to GI for further evaluation.  No further interventions needed at this time.  Return to clinic in 4 months with repeat laboratory work, further evaluation, and consideration of additional treatment.   I spent a total of 15 minutes face-to-face with the patient of which greater than 50% of the visit was spent in counseling and coordination of care as detailed above.   Patient expressed understanding and was in agreement with this plan. She also understands that She can call clinic at any time with any questions, concerns, or complaints.    Lloyd Huger, MD   04/16/2019 6:47 AM

## 2019-04-15 ENCOUNTER — Other Ambulatory Visit: Payer: Self-pay

## 2019-04-15 ENCOUNTER — Inpatient Hospital Stay: Payer: Medicare Other | Attending: Oncology

## 2019-04-15 ENCOUNTER — Encounter: Payer: Self-pay | Admitting: Oncology

## 2019-04-15 ENCOUNTER — Inpatient Hospital Stay: Payer: Medicare Other

## 2019-04-15 ENCOUNTER — Inpatient Hospital Stay (HOSPITAL_BASED_OUTPATIENT_CLINIC_OR_DEPARTMENT_OTHER): Payer: Medicare Other | Admitting: Oncology

## 2019-04-15 VITALS — BP 96/64 | HR 79 | Temp 97.2°F | Resp 18 | Wt 100.0 lb

## 2019-04-15 DIAGNOSIS — E785 Hyperlipidemia, unspecified: Secondary | ICD-10-CM | POA: Insufficient documentation

## 2019-04-15 DIAGNOSIS — Z85828 Personal history of other malignant neoplasm of skin: Secondary | ICD-10-CM | POA: Diagnosis not present

## 2019-04-15 DIAGNOSIS — D509 Iron deficiency anemia, unspecified: Secondary | ICD-10-CM

## 2019-04-15 DIAGNOSIS — Z7982 Long term (current) use of aspirin: Secondary | ICD-10-CM | POA: Diagnosis not present

## 2019-04-15 DIAGNOSIS — K589 Irritable bowel syndrome without diarrhea: Secondary | ICD-10-CM | POA: Diagnosis not present

## 2019-04-15 DIAGNOSIS — K219 Gastro-esophageal reflux disease without esophagitis: Secondary | ICD-10-CM | POA: Diagnosis not present

## 2019-04-15 DIAGNOSIS — Z79899 Other long term (current) drug therapy: Secondary | ICD-10-CM | POA: Diagnosis not present

## 2019-04-15 DIAGNOSIS — I2510 Atherosclerotic heart disease of native coronary artery without angina pectoris: Secondary | ICD-10-CM | POA: Insufficient documentation

## 2019-04-15 DIAGNOSIS — I129 Hypertensive chronic kidney disease with stage 1 through stage 4 chronic kidney disease, or unspecified chronic kidney disease: Secondary | ICD-10-CM | POA: Insufficient documentation

## 2019-04-15 DIAGNOSIS — Z951 Presence of aortocoronary bypass graft: Secondary | ICD-10-CM | POA: Insufficient documentation

## 2019-04-15 DIAGNOSIS — Z8249 Family history of ischemic heart disease and other diseases of the circulatory system: Secondary | ICD-10-CM | POA: Insufficient documentation

## 2019-04-15 DIAGNOSIS — Z955 Presence of coronary angioplasty implant and graft: Secondary | ICD-10-CM | POA: Diagnosis not present

## 2019-04-15 DIAGNOSIS — I341 Nonrheumatic mitral (valve) prolapse: Secondary | ICD-10-CM | POA: Insufficient documentation

## 2019-04-15 DIAGNOSIS — N189 Chronic kidney disease, unspecified: Secondary | ICD-10-CM | POA: Insufficient documentation

## 2019-04-15 LAB — CBC WITH DIFFERENTIAL/PLATELET
Abs Immature Granulocytes: 0.04 10*3/uL (ref 0.00–0.07)
Basophils Absolute: 0.1 10*3/uL (ref 0.0–0.1)
Basophils Relative: 1 %
Eosinophils Absolute: 0.3 10*3/uL (ref 0.0–0.5)
Eosinophils Relative: 5 %
HCT: 35.6 % — ABNORMAL LOW (ref 36.0–46.0)
Hemoglobin: 11.2 g/dL — ABNORMAL LOW (ref 12.0–15.0)
Immature Granulocytes: 1 %
Lymphocytes Relative: 23 %
Lymphs Abs: 1.5 10*3/uL (ref 0.7–4.0)
MCH: 29.4 pg (ref 26.0–34.0)
MCHC: 31.5 g/dL (ref 30.0–36.0)
MCV: 93.4 fL (ref 80.0–100.0)
Monocytes Absolute: 0.6 10*3/uL (ref 0.1–1.0)
Monocytes Relative: 9 %
Neutro Abs: 4.1 10*3/uL (ref 1.7–7.7)
Neutrophils Relative %: 61 %
Platelets: 282 10*3/uL (ref 150–400)
RBC: 3.81 MIL/uL — ABNORMAL LOW (ref 3.87–5.11)
RDW: 15 % (ref 11.5–15.5)
WBC: 6.7 10*3/uL (ref 4.0–10.5)
nRBC: 0 % (ref 0.0–0.2)

## 2019-04-15 LAB — FERRITIN: Ferritin: 197 ng/mL (ref 11–307)

## 2019-04-15 LAB — IRON AND TIBC
Iron: 103 ug/dL (ref 28–170)
Saturation Ratios: 44 % — ABNORMAL HIGH (ref 10.4–31.8)
TIBC: 234 ug/dL — ABNORMAL LOW (ref 250–450)
UIBC: 131 ug/dL

## 2019-06-24 DIAGNOSIS — R0602 Shortness of breath: Secondary | ICD-10-CM | POA: Insufficient documentation

## 2019-06-24 DIAGNOSIS — R002 Palpitations: Secondary | ICD-10-CM | POA: Insufficient documentation

## 2019-06-24 DIAGNOSIS — R0609 Other forms of dyspnea: Secondary | ICD-10-CM | POA: Insufficient documentation

## 2019-07-01 ENCOUNTER — Other Ambulatory Visit: Payer: Self-pay

## 2019-07-01 ENCOUNTER — Encounter (INDEPENDENT_AMBULATORY_CARE_PROVIDER_SITE_OTHER): Payer: Self-pay | Admitting: Vascular Surgery

## 2019-07-01 ENCOUNTER — Ambulatory Visit (INDEPENDENT_AMBULATORY_CARE_PROVIDER_SITE_OTHER): Payer: Medicare HMO | Admitting: Vascular Surgery

## 2019-07-01 VITALS — BP 110/72 | HR 80 | Resp 12 | Ht 60.0 in | Wt 101.0 lb

## 2019-07-01 DIAGNOSIS — I10 Essential (primary) hypertension: Secondary | ICD-10-CM

## 2019-07-01 DIAGNOSIS — I25118 Atherosclerotic heart disease of native coronary artery with other forms of angina pectoris: Secondary | ICD-10-CM | POA: Diagnosis not present

## 2019-07-01 DIAGNOSIS — I6523 Occlusion and stenosis of bilateral carotid arteries: Secondary | ICD-10-CM

## 2019-07-01 DIAGNOSIS — I701 Atherosclerosis of renal artery: Secondary | ICD-10-CM | POA: Diagnosis not present

## 2019-07-01 DIAGNOSIS — E782 Mixed hyperlipidemia: Secondary | ICD-10-CM

## 2019-07-01 NOTE — Progress Notes (Signed)
MRN : [id number]  [name] is a 74 y.o. ([birth year]) female who presents with chief complaint of  Chief Complaint  Patient presents with  . New Patient (Initial Visit)    Carotid Stenosis, Parachos  .  History of Present Illness:   The patient was previously seen at the [place] [facility] in November 2010.  At that time duplex ultrasound of the carotid arteries showed mild stenosis within the internal carotid arteries but greater than 70% stenosis of the bilateral external carotid arteries.  A renal artery duplex demonstrated moderate stenosis in the right and mild atherosclerotic changes of the left renal arteries.  She is sent back for further evaluation by Dr. Neldon Newport shows for evaluation of her carotid artery stenosis.  In the interim her renal function has been steadily declining and she is followed by Dr. Candiss Norse.  This has raised concerns regarding her atherosclerotic changes to the renal arteries.  The patient denies amaurosis fugax. There is no recent history of TIA symptoms or focal motor deficits. There is no prior documented CVA.  The patient is taking enteric-coated aspirin 81 mg daily.  There is no history of migraine headaches. There is no history of seizures.  The patient has a history of coronary artery disease, no recent episodes of angina or shortness of breath. The patient denies PAD or claudication symptoms. There is a history of hyperlipidemia which is being treated with a statin.      Current Meds  Medication Sig  . allopurinol (ZYLOPRIM) 100 MG tablet Take 100 mg by mouth daily.  Marland Kitchen aspirin 325 MG tablet Take 325 mg by mouth daily with lunch.   . AZO-CRANBERRY PO Take 1 capsule by mouth daily.  . clopidogrel (PLAVIX) 75 MG tablet Take 75 mg by mouth daily  . furosemide (LASIX) 40 MG tablet Take 20 mg by mouth daily.  Marland Kitchen gabapentin (NEURONTIN) 100 MG capsule Take 1 capsule by mouth at bedtime.  Nyoka Cowden Tea, Camellia sinensis, (GREEN  TEA EXTRACT) 90 % LIQD Administer     as directed  . hyoscyamine (LEVSIN SL) 0.125 MG SL tablet Take 0.125 mg by mouth 2 (two) times daily before lunch and supper.   . isosorbide mononitrate (IMDUR) 60 MG 24 hr tablet TAKE 60 MG BY MOUTH EVERY MORNING  . MAGNESIUM CITRATE PO Take 100 mg by mouth daily.   . metoprolol succinate (TOPROL-XL) 25 MG 24 hr tablet Take 25 mg by mouth daily with lunch.   . nitroGLYCERIN (NITROSTAT) 0.4 MG SL tablet Place 0.4 mg under the tongue every 5 (five) minutes as needed for chest pain.   . pantoprazole (PROTONIX) 40 MG tablet Take 40 mg by mouth daily.  . potassium chloride (MICRO-K) 10 MEQ CR capsule Take 20 mEq by mouth daily.   . promethazine (PHENERGAN) 25 MG tablet Take 25 mg by mouth every 8 (eight) hours as needed for nausea or vomiting.   . riboflavin (VITAMIN B-2) 100 MG TABS tablet Take 100 mg by mouth daily.  Marland Kitchen senna (SENOKOT) 8.6 MG tablet Take 6 tablets by mouth at bedtime.   . vitamin E 400 UNIT capsule Take 1 capsule by mouth 1 day or 1 dose.    Past Medical History:  Diagnosis Date  . Acid reflux   . Anemia   . CAD (coronary artery disease)   . Chronic constipation   . Chronic kidney disease    stage 3 chronic kidney disease  . Eczema   .  H/O heart artery stent   . Hyperlipidemia   . Hypertension   . IBS (irritable bowel syndrome) 2004  . Migraine   . Mitral valve prolapse   . Peptic ulcer 1989  . Skin cancer     Past Surgical History:  Procedure Laterality Date  . ABDOMINAL HYSTERECTOMY    . CARDIAC CATHETERIZATION N/A 10/21/2015   Procedure: Left Heart Cath and Coronary Angiography;  Surgeon: Isaias Cowman, MD;  Location: [place] [facility];  Service: Cardiovascular;  Laterality: N/A;  . CARDIAC CATHETERIZATION N/A 10/21/2015   Procedure: Coronary Stent Intervention;  Surgeon: Isaias Cowman, MD;  Location: [place] [facility];  Service: Cardiovascular;  Laterality: N/A;  . CARDIAC CATHETERIZATION Left 11/24/2015    Procedure: Coronary Stent Intervention;  Surgeon: Isaias Cowman, MD;  Location: [place] [facility];  Service: Cardiovascular;  Laterality: Left;  . CARDIAC SURGERY  2007   cardiac stent place  . CATARACT EXTRACTION  2012  . CHOLECYSTECTOMY    . CORONARY ANGIOPLASTY WITH STENT PLACEMENT  2013  . CORONARY ARTERY BYPASS GRAFT     triple  . CORONARY STENT INTERVENTION N/A 07/04/2017   Procedure: CORONARY STENT INTERVENTION;  Surgeon: Isaias Cowman, MD;  Location: [place] [facility];  Service: Cardiovascular;  Laterality: N/A;  . CORONARY STENT INTERVENTION N/A 08/08/2017   Procedure: CORONARY STENT INTERVENTION;  Surgeon: Isaias Cowman, MD;  Location: [place] [facility];  Service: Cardiovascular;  Laterality: N/A;  . CYSTOSCOPY W/ RETROGRADES Bilateral 12/29/2014   Procedure: CYSTOSCOPY WITH RETROGRADE PYELOGRAM;  Surgeon: Collier Flowers, MD;  Location: [facility];  Service: Urology;  Laterality: Bilateral;  . CYSTOSCOPY WITH BIOPSY N/A 12/29/2014   Procedure: CYSTOSCOPY WITH BIOPSY;  Surgeon: Collier Flowers, MD;  Location: [facility];  Service: Urology;  Laterality: N/A;  . HAND SURGERY  1998  . LEFT HEART CATH AND CORONARY ANGIOGRAPHY Left 08/08/2017   Procedure: LEFT HEART CATH AND CORONARY ANGIOGRAPHY;  Surgeon: Isaias Cowman, MD;  Location: [place] [facility];  Service: Cardiovascular;  Laterality: Left;  . LEFT HEART CATH AND CORS/GRAFTS ANGIOGRAPHY N/A 07/04/2017   Procedure: LEFT HEART CATH AND CORS/GRAFTS ANGIOGRAPHY;  Surgeon: Isaias Cowman, MD;  Location: [place] [facility];  Service: Cardiovascular;  Laterality: N/A;  . SIGMOID RESECTION / RECTOPEXY  2006  . SKIN CANCER EXCISION  2013  . TENDON REPAIR     right elbow  . TRIGGER FINGER RELEASE      Social History Social History   Tobacco Use  . Smoking status: Never Smoker  . Smokeless tobacco: Never Used  Substance Use Topics  . Alcohol use: No    Alcohol/week: 0.0 standard drinks  . Drug  use: No    Family History Family History  Problem Relation Age of Onset  . Kidney Stones Father   . Prostate cancer Father   . Heart disease Father   . Kidney Stones Brother   . Heart disease Mother     Allergies  Allergen Reactions  . Prochlorperazine Nausea And Vomiting, Other (See Comments) and Nausea Only    Severe vomiting  . Ezetimibe Other (See Comments)    Muscle Pain  . Statins Other (See Comments)    Muscle Pain  . Tape Other (See Comments)    Pulls my skin off when the tape is removed. Use paper tape only.  . Cephalexin Rash  . Metoclopramide Rash and Other (See Comments)    Elevates BP, face draws to one side  . Penicillin G  Rash  . Penicillins Rash and Other (See Comments)    Has patient had a PCN reaction causing immediate rash, facial/tongue/throat swelling, SOB or lightheadedness with hypotension: Yes Has patient had a PCN reaction causing severe rash involving mucus membranes or skin necrosis: Yes Has patient had a PCN reaction that required hospitalization No Has patient had a PCN reaction occurring within the last 10 years: No If all of the above answers are "NO", then may proceed with Cephalosporin use.      REVIEW OF SYSTEMS (Negative unless checked)  Constitutional: [] Weight loss  [] Fever  [] Chills Cardiac: [] Chest pain   [] Chest pressure   [] Palpitations   [] Shortness of breath when laying flat   [] Shortness of breath with exertion. Vascular:  [] Pain in legs with walking   [] Pain in legs at rest  [] History of DVT   [] Phlebitis   [] Swelling in legs   [] Varicose veins   [] Non-healing ulcers Pulmonary:   [] Uses home oxygen   [] Productive cough   [] Hemoptysis   [] Wheeze  [] COPD   [] Asthma Neurologic:  [] Dizziness   [] Seizures   [] History of stroke   [] History of TIA  [] Aphasia   [] Vissual changes   [] Weakness or numbness in arm   [] Weakness or numbness in leg Musculoskeletal:   [] Joint swelling   [] Joint pain   [] Low back pain Hematologic:  [] Easy  bruising  [] Easy bleeding   [] Hypercoagulable state   [] Anemic Gastrointestinal:  [] Diarrhea   [] Vomiting  [x] Gastroesophageal reflux/heartburn   [] Difficulty swallowing. Genitourinary:  [x] Chronic kidney disease   [] Difficult urination  [] Frequent urination   [] Blood in urine Skin:  [] Rashes   [] Ulcers  Psychological:  [] History of anxiety   []  History of major depression.  Physical Examination  Vitals:   07/01/19 1334  BP: 110/72  Pulse: 80  Resp: 12  Weight: 101 lb (45.8 kg)  Height: 5' (1.524 m)   Body mass index is 19.73 kg/m. Gen: WD/WN, NAD Head: [place]/AT, No temporalis wasting.  Ear/Nose/Throat: Hearing grossly intact, nares w/o erythema or drainage Eyes: PER, EOMI, sclera nonicteric.  Neck: Supple, no large masses.   Pulmonary:  Good air movement, no audible wheezing bilaterally, no use of accessory muscles.  Cardiac: RRR, no JVD Vascular:  Bilateral carotid bruits Vessel Right Left  Radial Palpable Palpable  Brachial Palpable Palpable  Carotid Palpable Palpable  Gastrointestinal: Non-distended. No guarding/no peritoneal signs.  Musculoskeletal: M/S 5/5 throughout.  No deformity or atrophy.  Neurologic: CN 2-12 intact. Symmetrical.  Speech is fluent. Motor exam as listed above. Psychiatric: Judgment intact, Mood & affect appropriate for pt's clinical situation. Dermatologic: No rashes or ulcers noted.  No changes consistent with cellulitis.  CBC Lab Results  Component Value Date   WBC 6.7 04/15/2019   HGB 11.2 (L) 04/15/2019   HCT 35.6 (L) 04/15/2019   MCV 93.4 04/15/2019   PLT 282 04/15/2019    BMET    Component Value Date/Time   NA 140 08/09/2017 0409   NA 137 05/24/2013 0357   K 3.9 08/09/2017 0409   K 2.3 (LL) 05/24/2013 0357   CL 111 08/09/2017 0409   CL 97 (L) 05/24/2013 0357   CO2 23 08/09/2017 0409   CO2 36 (H) 05/24/2013 0357   GLUCOSE 96 08/09/2017 0409   GLUCOSE 91 05/24/2013 0357   BUN 23 (H) 08/09/2017 0409   BUN 21 (H) 05/24/2013 0357    CREATININE 1.52 (H) 08/09/2017 0409   CREATININE 1.32 (H) 05/24/2013 0357   CALCIUM 8.4 (L) 08/09/2017  0409   CALCIUM 7.9 (L) 05/24/2013 0357   GFRNONAA 34 (L) 08/09/2017 0409   GFRNONAA 42 (L) 05/24/2013 0357   GFRAA 39 (L) 08/09/2017 0409   GFRAA 49 (L) 05/24/2013 0357   CrCl cannot be calculated (Patient's most recent lab result is older than the maximum 21 days allowed.).  COAG Lab Results  Component Value Date   INR 1.5 12/04/2006   INR 0.9 11/30/2006    Radiology No results found.   Assessment/Plan 1. Renal artery stenosis (HCC) Given patient's arterial disease optimal control of the patient's hypertension is important. BP is acceptable today  The patient's past noninvasive studies support the renal artery stenosis.  Duplex ultrasound will be ordered.  The patient will continue the current antihypertensive medications, no changes at this time.  The primary medical service will continue aggressive antihypertensive therapy as per the [facility] guidelines    A total of 40 minutes was spent with this patient and greater than 50% was spent in counseling and coordination of care with the patient.  Discussion included the treatment options for vascular disease including indications for surgery and intervention.  Also discussed is the appropriate timing of treatment.  In addition medical therapy was discussed.   - VAS US RENAL ARTERY DUPLEX; Future  2. Bilateral carotid artery stenosis Recommend:  Given the patient's asymptomatic subcritical stenosis no further invasive testing or surgery at this time.  Continue antiplatelet therapy as prescribed Continue management of CAD, HTN and Hyperlipidemia Healthy heart diet,  encouraged exercise at least 4 times per week Follow up in 1 months with duplex ultrasound and physical exam   - VAS US CAROTID; Future  3. Coronary artery disease of native artery of native heart with stable angina pectoris (HCC) Continue cardiac and  antihypertensive medications as already ordered and reviewed, no changes at this time.  Continue statin as ordered and reviewed, no changes at this time  Nitrates PRN for chest pain   4. Essential hypertension Continue antihypertensive medications as already ordered, these medications have been reviewed and there are no changes at this time.   5. Mixed hyperlipidemia Continue statin as ordered and reviewed, no changes at this time     Hortencia Pilar, MD  07/01/2019 1:47 PM

## 2019-07-05 ENCOUNTER — Encounter (INDEPENDENT_AMBULATORY_CARE_PROVIDER_SITE_OTHER): Payer: Self-pay | Admitting: Vascular Surgery

## 2019-07-05 DIAGNOSIS — I6529 Occlusion and stenosis of unspecified carotid artery: Secondary | ICD-10-CM | POA: Insufficient documentation

## 2019-07-15 ENCOUNTER — Ambulatory Visit (INDEPENDENT_AMBULATORY_CARE_PROVIDER_SITE_OTHER): Payer: Medicare HMO | Admitting: Vascular Surgery

## 2019-07-15 ENCOUNTER — Ambulatory Visit (INDEPENDENT_AMBULATORY_CARE_PROVIDER_SITE_OTHER): Payer: Medicare HMO

## 2019-07-15 ENCOUNTER — Encounter (INDEPENDENT_AMBULATORY_CARE_PROVIDER_SITE_OTHER): Payer: Self-pay | Admitting: Vascular Surgery

## 2019-07-15 ENCOUNTER — Other Ambulatory Visit: Payer: Self-pay

## 2019-07-15 VITALS — BP 106/68 | HR 78 | Resp 16 | Wt 100.0 lb

## 2019-07-15 DIAGNOSIS — I6523 Occlusion and stenosis of bilateral carotid arteries: Secondary | ICD-10-CM

## 2019-07-15 DIAGNOSIS — E782 Mixed hyperlipidemia: Secondary | ICD-10-CM | POA: Diagnosis not present

## 2019-07-15 DIAGNOSIS — I701 Atherosclerosis of renal artery: Secondary | ICD-10-CM | POA: Diagnosis not present

## 2019-07-15 DIAGNOSIS — I25118 Atherosclerotic heart disease of native coronary artery with other forms of angina pectoris: Secondary | ICD-10-CM | POA: Diagnosis not present

## 2019-07-15 NOTE — Progress Notes (Signed)
MRN : [id number]  [name] is a 74 y.o. ([birth year]) female who presents with chief complaint of No chief complaint on file. Marland Kitchen  History of Present Illness:   The patient is seen for follow up evaluation of carotid stenosis as well as presumed renal artery stenosis. The carotid stenosis followed by ultrasound.   The patient denies amaurosis fugax. There is no recent history of TIA symptoms or focal motor deficits. There is no prior documented CVA.  The patient is taking enteric-coated aspirin 81 mg daily.  There is no history of migraine headaches. There is no history of seizures.  The patient has a history of coronary artery disease, no recent episodes of angina or shortness of breath. The patient denies PAD or claudication symptoms. There is a history of hyperlipidemia which is being treated with a statin.    Carotid Duplex done today shows 40 to 59% right internal carotid artery stenosis and 60 to 79% left internal carotid artery stenosis..    Renal artery duplex demonstrates normal flow patterns in both the right and left renal arteries that matched the aortic velocities.  There is no evidence of a hemodynamically significant stenosis of either renal artery.  The right kidney is approximately 8 cm in long axis and the left kidney was 9 cm in long axis.  No outpatient medications have been marked as taking for the 07/15/19 encounter (Appointment) with Delana Meyer, Dolores Lory, MD.    Past Medical History:  Diagnosis Date  . Acid reflux   . Anemia   . CAD (coronary artery disease)   . Chronic constipation   . Chronic kidney disease    stage 3 chronic kidney disease  . Eczema   . H/O heart artery stent   . Hyperlipidemia   . Hypertension   . IBS (irritable bowel syndrome) 2004  . Migraine   . Mitral valve prolapse   . Peptic ulcer 1989  . Skin cancer     Past Surgical History:  Procedure Laterality Date  . ABDOMINAL HYSTERECTOMY    . CARDIAC CATHETERIZATION N/A  10/21/2015   Procedure: Left Heart Cath and Coronary Angiography;  Surgeon: Isaias Cowman, MD;  Location: [place] [facility];  Service: Cardiovascular;  Laterality: N/A;  . CARDIAC CATHETERIZATION N/A 10/21/2015   Procedure: Coronary Stent Intervention;  Surgeon: Isaias Cowman, MD;  Location: [place] [facility];  Service: Cardiovascular;  Laterality: N/A;  . CARDIAC CATHETERIZATION Left 11/24/2015   Procedure: Coronary Stent Intervention;  Surgeon: Isaias Cowman, MD;  Location: [place] [facility];  Service: Cardiovascular;  Laterality: Left;  . CARDIAC SURGERY  2007   cardiac stent place  . CATARACT EXTRACTION  2012  . CHOLECYSTECTOMY    . CORONARY ANGIOPLASTY WITH STENT PLACEMENT  2013  . CORONARY ARTERY BYPASS GRAFT     triple  . CORONARY STENT INTERVENTION N/A 07/04/2017   Procedure: CORONARY STENT INTERVENTION;  Surgeon: Isaias Cowman, MD;  Location: [place] [facility];  Service: Cardiovascular;  Laterality: N/A;  . CORONARY STENT INTERVENTION N/A 08/08/2017   Procedure: CORONARY STENT INTERVENTION;  Surgeon: Isaias Cowman, MD;  Location: [place] [facility];  Service: Cardiovascular;  Laterality: N/A;  . CYSTOSCOPY W/ RETROGRADES Bilateral 12/29/2014   Procedure: CYSTOSCOPY WITH RETROGRADE PYELOGRAM;  Surgeon: Collier Flowers, MD;  Location: [facility];  Service: Urology;  Laterality: Bilateral;  . CYSTOSCOPY WITH BIOPSY N/A 12/29/2014   Procedure: CYSTOSCOPY WITH BIOPSY;  Surgeon: Collier Flowers, MD;  Location: [facility];  Service: Urology;  Laterality: N/A;  . HAND SURGERY  1998  . LEFT HEART CATH AND CORONARY ANGIOGRAPHY Left 08/08/2017   Procedure: LEFT HEART CATH AND CORONARY ANGIOGRAPHY;  Surgeon: Isaias Cowman, MD;  Location: [place] [facility];  Service: Cardiovascular;  Laterality: Left;  . LEFT HEART CATH AND CORS/GRAFTS ANGIOGRAPHY N/A 07/04/2017   Procedure: LEFT HEART CATH AND CORS/GRAFTS ANGIOGRAPHY;  Surgeon: Isaias Cowman, MD;   Location: [place] [facility];  Service: Cardiovascular;  Laterality: N/A;  . SIGMOID RESECTION / RECTOPEXY  2006  . SKIN CANCER EXCISION  2013  . TENDON REPAIR     right elbow  . TRIGGER FINGER RELEASE      Social History Social History   Tobacco Use  . Smoking status: Never Smoker  . Smokeless tobacco: Never Used  Substance Use Topics  . Alcohol use: No    Alcohol/week: 0.0 standard drinks  . Drug use: No    Family History Family History  Problem Relation Age of Onset  . Kidney Stones Father   . Prostate cancer Father   . Heart disease Father   . Kidney Stones Brother   . Heart disease Mother     Allergies  Allergen Reactions  . Prochlorperazine Nausea And Vomiting, Other (See Comments) and Nausea Only    Severe vomiting  . Ezetimibe Other (See Comments)    Muscle Pain  . Statins Other (See Comments)    Muscle Pain  . Tape Other (See Comments)    Pulls my skin off when the tape is removed. Use paper tape only.  . Cephalexin Rash  . Metoclopramide Rash and Other (See Comments)    Elevates BP, face draws to one side  . Penicillin G Rash  . Penicillins Rash and Other (See Comments)    Has patient had a PCN reaction causing immediate rash, facial/tongue/throat swelling, SOB or lightheadedness with hypotension: Yes Has patient had a PCN reaction causing severe rash involving mucus membranes or skin necrosis: Yes Has patient had a PCN reaction that required hospitalization No Has patient had a PCN reaction occurring within the last 10 years: No If all of the above answers are "NO", then may proceed with Cephalosporin use.      REVIEW OF SYSTEMS (Negative unless checked)  Constitutional: [] Weight loss  [] Fever  [] Chills Cardiac: [] Chest pain   [] Chest pressure   [] Palpitations   [] Shortness of breath when laying flat   [] Shortness of breath with exertion. Vascular:  [] Pain in legs with walking   [] Pain in legs at rest  [] History of DVT   [] Phlebitis   [] Swelling  in legs   [] Varicose veins   [] Non-healing ulcers Pulmonary:   [] Uses home oxygen   [] Productive cough   [] Hemoptysis   [] Wheeze  [] COPD   [] Asthma Neurologic:  [] Dizziness   [] Seizures   [] History of stroke   [] History of TIA  [] Aphasia   [] Vissual changes   [] Weakness or numbness in arm   [] Weakness or numbness in leg Musculoskeletal:   [] Joint swelling   [] Joint pain   [] Low back pain Hematologic:  [] Easy bruising  [] Easy bleeding   [] Hypercoagulable state   [] Anemic Gastrointestinal:  [] Diarrhea   [] Vomiting  [] Gastroesophageal reflux/heartburn   [] Difficulty swallowing. Genitourinary:  [] Chronic kidney disease   [] Difficult urination  [] Frequent urination   [] Blood in urine Skin:  [] Rashes   [] Ulcers  Psychological:  [] History of anxiety   []  History of major depression.  Physical Examination  There were no vitals  filed for this visit. There is no height or weight on file to calculate BMI. Gen: WD/WN, NAD Head: [place]/AT, No temporalis wasting.  Ear/Nose/Throat: Hearing grossly intact, nares w/o erythema or drainage Eyes: PER, EOMI, sclera nonicteric.  Neck: Supple, no large masses.   Pulmonary:  Good air movement, no audible wheezing bilaterally, no use of accessory muscles.  Cardiac: RRR, no JVD Vascular:  Vessel Right Left  Radial Palpable Palpable  Gastrointestinal: Non-distended. No guarding/no peritoneal signs.  Musculoskeletal: M/S 5/5 throughout.  No deformity or atrophy.  Neurologic: CN 2-12 intact. Symmetrical.  Speech is fluent. Motor exam as listed above. Psychiatric: Judgment intact, Mood & affect appropriate for pt's clinical situation. Dermatologic: No rashes or ulcers noted.  No changes consistent with cellulitis. Lymph : No lichenification or skin changes of chronic lymphedema.  CBC Lab Results  Component Value Date   WBC 6.7 04/15/2019   HGB 11.2 (L) 04/15/2019   HCT 35.6 (L) 04/15/2019   MCV 93.4 04/15/2019   PLT 282 04/15/2019    BMET    Component Value  Date/Time   NA 140 08/09/2017 0409   NA 137 05/24/2013 0357   K 3.9 08/09/2017 0409   K 2.3 (LL) 05/24/2013 0357   CL 111 08/09/2017 0409   CL 97 (L) 05/24/2013 0357   CO2 23 08/09/2017 0409   CO2 36 (H) 05/24/2013 0357   GLUCOSE 96 08/09/2017 0409   GLUCOSE 91 05/24/2013 0357   BUN 23 (H) 08/09/2017 0409   BUN 21 (H) 05/24/2013 0357   CREATININE 1.52 (H) 08/09/2017 0409   CREATININE 1.32 (H) 05/24/2013 0357   CALCIUM 8.4 (L) 08/09/2017 0409   CALCIUM 7.9 (L) 05/24/2013 0357   GFRNONAA 34 (L) 08/09/2017 0409   GFRNONAA 42 (L) 05/24/2013 0357   GFRAA 39 (L) 08/09/2017 0409   GFRAA 49 (L) 05/24/2013 0357   CrCl cannot be calculated (Patient's most recent lab result is older than the maximum 21 days allowed.).  COAG Lab Results  Component Value Date   INR 1.5 12/04/2006   INR 0.9 11/30/2006    Radiology No results found.   Assessment/Plan 1. Renal artery stenosis (HCC) Given patient's arterial disease optimal control of the patient's hypertension is important. BP is acceptable today and the fact of the matter is she has relatively low blood pressure and has not hypertensive.  The patient's vital signs and noninvasive studies support the renal arteries are not significantly affected by plaque.  No invasive studies or intervention is indicated at this time.  The patient will continue the current medications, no changes at this time.  The primary medical service will continue aggressive antihypertensive therapy as per the AHA guidelines   2. Bilateral carotid artery stenosis Recommend:  Given the patient's asymptomatic subcritical stenosis no further invasive testing or surgery at this time.  Duplex ultrasound shows 40 to 59% stenosis of the right internal carotid artery and 60 to 79% stenosis of the left internal carotid artery.  Continue antiplatelet therapy as prescribed Continue management of CAD, HTN and Hyperlipidemia Healthy heart diet,  encouraged exercise  at least 4 times per week Follow up in 6 months with duplex ultrasound and physical exam  - VAS US CAROTID; Future  3. Coronary artery disease of native artery of native heart with stable angina pectoris (HCC) Continue cardiac and antihypertensive medications as already ordered and reviewed, no changes at this time.  Continue statin as ordered and reviewed, no changes at this time  Nitrates PRN for chest  pain   4. Mixed hyperlipidemia Continue statin as ordered and reviewed, no changes at this time    Hortencia Pilar, MD  07/15/2019 8:17 AM

## 2019-07-29 ENCOUNTER — Other Ambulatory Visit: Payer: Self-pay

## 2019-07-29 ENCOUNTER — Other Ambulatory Visit
Admission: RE | Admit: 2019-07-29 | Discharge: 2019-07-29 | Disposition: A | Discharge status: Home / Self Care | Payer: Medicare HMO | Source: Ambulatory Visit | Attending: Cardiology | Admitting: Cardiology

## 2019-07-29 DIAGNOSIS — K219 Gastro-esophageal reflux disease without esophagitis: Secondary | ICD-10-CM | POA: Diagnosis not present

## 2019-07-29 DIAGNOSIS — Z955 Presence of coronary angioplasty implant and graft: Secondary | ICD-10-CM | POA: Diagnosis not present

## 2019-07-29 DIAGNOSIS — I25708 Atherosclerosis of coronary artery bypass graft(s), unspecified, with other forms of angina pectoris: Secondary | ICD-10-CM | POA: Diagnosis not present

## 2019-07-29 DIAGNOSIS — Z01812 Encounter for preprocedural laboratory examination: Secondary | ICD-10-CM | POA: Insufficient documentation

## 2019-07-29 DIAGNOSIS — I129 Hypertensive chronic kidney disease with stage 1 through stage 4 chronic kidney disease, or unspecified chronic kidney disease: Secondary | ICD-10-CM | POA: Diagnosis not present

## 2019-07-29 DIAGNOSIS — K589 Irritable bowel syndrome without diarrhea: Secondary | ICD-10-CM | POA: Diagnosis not present

## 2019-07-29 DIAGNOSIS — Z79899 Other long term (current) drug therapy: Secondary | ICD-10-CM | POA: Diagnosis not present

## 2019-07-29 DIAGNOSIS — Z7982 Long term (current) use of aspirin: Secondary | ICD-10-CM | POA: Diagnosis not present

## 2019-07-29 DIAGNOSIS — N184 Chronic kidney disease, stage 4 (severe): Secondary | ICD-10-CM | POA: Diagnosis not present

## 2019-07-29 DIAGNOSIS — Z20822 Contact with and (suspected) exposure to covid-19: Secondary | ICD-10-CM | POA: Insufficient documentation

## 2019-07-29 DIAGNOSIS — Z7902 Long term (current) use of antithrombotics/antiplatelets: Secondary | ICD-10-CM | POA: Diagnosis not present

## 2019-07-29 DIAGNOSIS — R079 Chest pain, unspecified: Secondary | ICD-10-CM | POA: Diagnosis present

## 2019-07-29 DIAGNOSIS — E782 Mixed hyperlipidemia: Secondary | ICD-10-CM | POA: Diagnosis not present

## 2019-07-29 DIAGNOSIS — I701 Atherosclerosis of renal artery: Secondary | ICD-10-CM | POA: Diagnosis not present

## 2019-07-29 LAB — SARS CORONAVIRUS 2 (TAT 6-24 HRS): SARS Coronavirus 2: NEGATIVE

## 2019-08-01 ENCOUNTER — Ambulatory Visit
Admission: RE | Admit: 2019-08-01 | Discharge: 2019-08-01 | Disposition: A | Discharge status: Home / Self Care | Payer: Medicare HMO | Source: Ambulatory Visit | Attending: Cardiology | Admitting: Cardiology

## 2019-08-01 ENCOUNTER — Other Ambulatory Visit: Payer: Self-pay

## 2019-08-01 ENCOUNTER — Encounter
Admission: RE | Disposition: A | Discharge status: Home / Self Care | Payer: Self-pay | Source: Ambulatory Visit | Attending: Cardiology

## 2019-08-01 ENCOUNTER — Encounter: Payer: Self-pay | Admitting: Cardiology

## 2019-08-01 DIAGNOSIS — Z79899 Other long term (current) drug therapy: Secondary | ICD-10-CM | POA: Insufficient documentation

## 2019-08-01 DIAGNOSIS — K219 Gastro-esophageal reflux disease without esophagitis: Secondary | ICD-10-CM | POA: Insufficient documentation

## 2019-08-01 DIAGNOSIS — I25708 Atherosclerosis of coronary artery bypass graft(s), unspecified, with other forms of angina pectoris: Secondary | ICD-10-CM | POA: Insufficient documentation

## 2019-08-01 DIAGNOSIS — R079 Chest pain, unspecified: Secondary | ICD-10-CM | POA: Insufficient documentation

## 2019-08-01 DIAGNOSIS — Z955 Presence of coronary angioplasty implant and graft: Secondary | ICD-10-CM | POA: Insufficient documentation

## 2019-08-01 DIAGNOSIS — E782 Mixed hyperlipidemia: Secondary | ICD-10-CM | POA: Insufficient documentation

## 2019-08-01 DIAGNOSIS — Z7982 Long term (current) use of aspirin: Secondary | ICD-10-CM | POA: Insufficient documentation

## 2019-08-01 DIAGNOSIS — I701 Atherosclerosis of renal artery: Secondary | ICD-10-CM | POA: Insufficient documentation

## 2019-08-01 DIAGNOSIS — Z20822 Contact with and (suspected) exposure to covid-19: Secondary | ICD-10-CM | POA: Insufficient documentation

## 2019-08-01 DIAGNOSIS — K589 Irritable bowel syndrome without diarrhea: Secondary | ICD-10-CM | POA: Insufficient documentation

## 2019-08-01 DIAGNOSIS — I129 Hypertensive chronic kidney disease with stage 1 through stage 4 chronic kidney disease, or unspecified chronic kidney disease: Secondary | ICD-10-CM | POA: Insufficient documentation

## 2019-08-01 DIAGNOSIS — N184 Chronic kidney disease, stage 4 (severe): Secondary | ICD-10-CM | POA: Insufficient documentation

## 2019-08-01 DIAGNOSIS — Z7902 Long term (current) use of antithrombotics/antiplatelets: Secondary | ICD-10-CM | POA: Insufficient documentation

## 2019-08-01 HISTORY — PX: LEFT HEART CATH AND CORONARY ANGIOGRAPHY: CATH118249

## 2019-08-01 LAB — POCT ACTIVATED CLOTTING TIME: Activated Clotting Time: 235 seconds

## 2019-08-01 SURGERY — LEFT HEART CATH AND CORONARY ANGIOGRAPHY
Anesthesia: Moderate Sedation | Laterality: Left

## 2019-08-01 MED ORDER — MIDAZOLAM HCL 2 MG/2ML IJ SOLN
INTRAMUSCULAR | Status: DC | PRN
  Administered 2019-08-01: 1 mg via INTRAVENOUS

## 2019-08-01 MED ORDER — SODIUM CHLORIDE 0.9 % IV SOLN: 250.0000 mL | INTRAVENOUS | Status: DC | PRN

## 2019-08-01 MED ORDER — FENTANYL CITRATE (PF) 100 MCG/2ML IJ SOLN
INTRAMUSCULAR | Status: AC
  Filled 2019-08-01: qty 2

## 2019-08-01 MED ORDER — SODIUM CHLORIDE 0.9 % IV SOLN
INTRAVENOUS | Status: DC | PRN
  Administered 2019-08-01: 1 mg/kg/h via INTRAVENOUS

## 2019-08-01 MED ORDER — IOHEXOL 300 MG/ML  SOLN
INTRAMUSCULAR | Status: DC | PRN
  Administered 2019-08-01: 70 mL

## 2019-08-01 MED ORDER — SODIUM CHLORIDE 0.9% FLUSH: 3.0000 mL | INTRAVENOUS | Status: DC | PRN

## 2019-08-01 MED ORDER — SODIUM CHLORIDE 0.9 % IV SOLN: INTRAVENOUS | Status: DC

## 2019-08-01 MED ORDER — FENTANYL CITRATE (PF) 100 MCG/2ML IJ SOLN
INTRAMUSCULAR | Status: DC | PRN
  Administered 2019-08-01: 25 ug via INTRAVENOUS

## 2019-08-01 MED ORDER — HEPARIN (PORCINE) IN NACL 1000-0.9 UT/500ML-% IV SOLN
INTRAVENOUS | Status: DC | PRN
  Administered 2019-08-01: 500 mL

## 2019-08-01 MED ORDER — SODIUM CHLORIDE 0.9% FLUSH: 3.0000 mL | Freq: Two times a day (BID) | INTRAVENOUS | Status: DC

## 2019-08-01 MED ORDER — BIVALIRUDIN TRIFLUOROACETATE 250 MG IV SOLR
INTRAVENOUS | Status: AC
  Filled 2019-08-01: qty 250

## 2019-08-01 MED ORDER — ACETAMINOPHEN 325 MG PO TABS: 650.0000 mg | ORAL_TABLET | ORAL | Status: DC | PRN

## 2019-08-01 MED ORDER — BIVALIRUDIN BOLUS VIA INFUSION - CUPID
INTRAVENOUS | Status: DC | PRN
  Administered 2019-08-01: 33.675 mg via INTRAVENOUS

## 2019-08-01 MED ORDER — SODIUM CHLORIDE 0.9 % WEIGHT BASED INFUSION: 1.0000 mL/kg/h | INTRAVENOUS | Status: DC

## 2019-08-01 MED ORDER — HYDRALAZINE HCL 20 MG/ML IJ SOLN: 10.0000 mg | INTRAMUSCULAR | Status: DC | PRN

## 2019-08-01 MED ORDER — HEPARIN (PORCINE) IN NACL 1000-0.9 UT/500ML-% IV SOLN
INTRAVENOUS | Status: AC
  Filled 2019-08-01: qty 1000

## 2019-08-01 MED ORDER — LABETALOL HCL 5 MG/ML IV SOLN: 10.0000 mg | INTRAVENOUS | Status: DC | PRN

## 2019-08-01 MED ORDER — ASPIRIN 81 MG PO CHEW
CHEWABLE_TABLET | ORAL | Status: AC
  Filled 2019-08-01: qty 1

## 2019-08-01 MED ORDER — MIDAZOLAM HCL 2 MG/2ML IJ SOLN
INTRAMUSCULAR | Status: AC
  Filled 2019-08-01: qty 2

## 2019-08-01 MED ORDER — ASPIRIN 81 MG PO CHEW
81.0000 mg | CHEWABLE_TABLET | ORAL | Status: AC
  Administered 2019-08-01: 81 mg via ORAL

## 2019-08-01 MED ORDER — ONDANSETRON HCL 4 MG/2ML IJ SOLN: 4.0000 mg | Freq: Four times a day (QID) | INTRAMUSCULAR | Status: DC | PRN

## 2019-08-01 SURGICAL SUPPLY — 14 items
CATH EAGLE EYE PLAT IMAGING (CATHETERS) ×1 IMPLANT
CATH INFINITI 5FR JL4 (CATHETERS) ×2 IMPLANT
CATH INFINITI JR4 5F (CATHETERS) ×2 IMPLANT
CATH LAUNCHER 6FR JL3.5 (CATHETERS) ×2 IMPLANT
DEVICE CLOSURE MYNXGRIP 6/7F (Vascular Products) ×1 IMPLANT
DEVICE INFLAT 30 PLUS (MISCELLANEOUS) ×2 IMPLANT
KIT MANI 3VAL PERCEP (MISCELLANEOUS) ×2 IMPLANT
NDL PERC 18GX7CM (NEEDLE) ×1 IMPLANT
NEEDLE PERC 18GX7CM (NEEDLE) ×2 IMPLANT
PACK CARDIAC CATH (CUSTOM PROCEDURE TRAY) ×2 IMPLANT
SHEATH AVANTI 5FR X 11CM (SHEATH) ×2 IMPLANT
SHEATH AVANTI 6FR X 11CM (SHEATH) ×2 IMPLANT
WIRE ASAHI PROWATER 180CM (WIRE) ×2 IMPLANT
WIRE GUIDERIGHT .035X150 (WIRE) ×2 IMPLANT

## 2019-08-01 NOTE — Progress Notes (Signed)
Pt. sittting up in chair since 10:30 AM. Right groin without any complications. BP low: pt. Remains asymptomatic. Pt. Encouraged to drink more H2O. Denies any c/o CP, SOB, N/V, HA, dizziness or groin discomfort. Will continue to monitor.

## 2019-08-01 NOTE — Progress Notes (Signed)
Dr. Saralyn Pilar at bedside, speaking with pt. And her spouse re: cath results. Orders received for NS at 125 ml/hr. X 4 hrs. Pt. Drinking fluids also.

## 2019-08-01 NOTE — Progress Notes (Signed)
Pt. GFR low, Cre 1.9. Pt. States I have a problem with my right kidney: it's been a problem since 2010. Cath lab staff made aware. States "I also run a low blood pressure, and I have passed out before." Pt. Asymptomatic at present. Stable for cath.

## 2019-08-07 ENCOUNTER — Other Ambulatory Visit: Payer: Self-pay | Admitting: Oncology

## 2019-08-08 NOTE — Progress Notes (Signed)
[place]  [phone] Fax:(336) [phone]  ID: [name] OB: [birth year]  MR#: [id number]  VEL#:[id number]  Patient Care Team: Tracie Harrier, MD as PCP - General (Internal Medicine)  CHIEF COMPLAINT: Iron deficiency anemia  INTERVAL HISTORY: Patient returns to clinic today for repeat laboratory work, further evaluation, and consideration of additional IV iron.  She has continued chronic weakness and fatigue.  Recent cardiac catheterization revealed persistent significant cardiac disease. She has no neurologic complaints.  She denies any recent fevers or illnesses.  She has a good appetite and denies weight loss.  She has no chest pain, shortness of breath, cough, or hemoptysis.  She denies any nausea, vomiting, constipation, or diarrhea.  She has no melena or hematochezia.  She has no urinary complaints.  Patient offers no further specific complaints today.  REVIEW OF SYSTEMS:   Review of Systems  Constitutional: Positive for malaise/fatigue. Negative for fever and weight loss.  Respiratory: Negative.  Negative for cough, hemoptysis and shortness of breath.   Cardiovascular: Negative.  Negative for chest pain and leg swelling.  Gastrointestinal: Negative.  Negative for abdominal pain, blood in stool and melena.  Genitourinary: Negative.  Negative for dysuria and hematuria.  Musculoskeletal: Negative.  Negative for back pain.  Skin: Negative.  Negative for rash.  Neurological: Positive for weakness. Negative for dizziness, focal weakness and headaches.  Psychiatric/Behavioral: Negative.  The patient is not nervous/anxious.     As per HPI. Otherwise, a complete review of systems is negative.  PAST MEDICAL HISTORY: Past Medical History:  Diagnosis Date  . Acid reflux   . Anemia   . CAD (coronary artery disease)   . Chronic constipation   . Chronic kidney disease    stage 3 chronic kidney disease  . Eczema   . H/O heart artery stent   .  Hyperlipidemia   . Hypertension   . IBS (irritable bowel syndrome) 2004  . Migraine   . Mitral valve prolapse   . Peptic ulcer 1989  . Skin cancer     PAST SURGICAL HISTORY: Past Surgical History:  Procedure Laterality Date  . ABDOMINAL HYSTERECTOMY    . APPENDECTOMY    . CARDIAC CATHETERIZATION N/A 10/21/2015   Procedure: Left Heart Cath and Coronary Angiography;  Surgeon: Isaias Cowman, MD;  Location: [facility];  Service: Cardiovascular;  Laterality: N/A;  . CARDIAC CATHETERIZATION N/A 10/21/2015   Procedure: Coronary Stent Intervention;  Surgeon: Isaias Cowman, MD;  Location: [facility];  Service: Cardiovascular;  Laterality: N/A;  . CARDIAC CATHETERIZATION Left 11/24/2015   Procedure: Coronary Stent Intervention;  Surgeon: Isaias Cowman, MD;  Location: [facility];  Service: Cardiovascular;  Laterality: Left;  . CARDIAC SURGERY  2007   cardiac stent place  . CATARACT EXTRACTION  2012  . CHOLECYSTECTOMY    . CORONARY ANGIOPLASTY WITH STENT PLACEMENT  2013  . CORONARY ARTERY BYPASS GRAFT  2008  . CORONARY STENT INTERVENTION N/A 07/04/2017   Procedure: CORONARY STENT INTERVENTION;  Surgeon: Isaias Cowman, MD;  Location: [facility];  Service: Cardiovascular;  Laterality: N/A;  . CORONARY STENT INTERVENTION N/A 08/08/2017   Procedure: CORONARY STENT INTERVENTION;  Surgeon: Isaias Cowman, MD;  Location: [place] [facility];  Service: Cardiovascular;  Laterality: N/A;  . CYSTOSCOPY W/ RETROGRADES Bilateral 12/29/2014   Procedure: CYSTOSCOPY WITH RETROGRADE PYELOGRAM;  Surgeon: Collier Flowers, MD;  Location: [facility];  Service: Urology;  Laterality: Bilateral;  . CYSTOSCOPY WITH BIOPSY N/A 12/29/2014  Procedure: CYSTOSCOPY WITH BIOPSY;  Surgeon: Collier Flowers, MD;  Location: [facility];  Service: Urology;  Laterality: N/A;  . HAND SURGERY  1998  . LEFT HEART CATH AND CORONARY ANGIOGRAPHY Left 08/08/2017   Procedure: LEFT  HEART CATH AND CORONARY ANGIOGRAPHY;  Surgeon: Isaias Cowman, MD;  Location: [facility];  Service: Cardiovascular;  Laterality: Left;  . LEFT HEART CATH AND CORONARY ANGIOGRAPHY Left 08/01/2019   Procedure: LEFT HEART CATH AND CORONARY ANGIOGRAPHY;  Surgeon: Isaias Cowman, MD;  Location: [facility];  Service: Cardiovascular;  Laterality: Left;  . LEFT HEART CATH AND CORS/GRAFTS ANGIOGRAPHY N/A 07/04/2017   Procedure: LEFT HEART CATH AND CORS/GRAFTS ANGIOGRAPHY;  Surgeon: Isaias Cowman, MD;  Location: [facility];  Service: Cardiovascular;  Laterality: N/A;  . SIGMOID RESECTION / RECTOPEXY  2006  . SKIN CANCER EXCISION  2013  . TENDON REPAIR     right elbow  . TRIGGER FINGER RELEASE      FAMILY HISTORY: Family History  Problem Relation Age of Onset  . Kidney Stones Father   . Prostate cancer Father   . Heart disease Father   . Kidney Stones Brother   . Heart disease Mother     ADVANCED DIRECTIVES (Y/N):  N  HEALTH MAINTENANCE: Social History   Tobacco Use  . Smoking status: Never Smoker  . Smokeless tobacco: Never Used  Substance Use Topics  . Alcohol use: No  . Drug use: No     Colonoscopy:  PAP:  Bone density:  Lipid panel:  Allergies  Allergen Reactions  . Prochlorperazine Nausea And Vomiting, Other (See Comments) and Nausea Only    Severe vomiting  . Ezetimibe Other (See Comments)    Muscle Pain  . Statins Other (See Comments)    Muscle Pain  . Tape Other (See Comments)    Pulls my skin off when the tape is removed. Use paper tape only.  . Cephalexin Rash  . Metoclopramide Rash and Other (See Comments)    Elevates BP, face draws to one side  . Penicillins Rash and Other (See Comments)    Has patient had a PCN reaction causing immediate rash, facial/tongue/throat swelling, SOB or lightheadedness with hypotension: Yes Has patient had a PCN reaction causing severe rash involving mucus membranes or skin necrosis:  Yes Has patient had a PCN reaction that required hospitalization No Has patient had a PCN reaction occurring within the last 10 years: No If all of the above answers are "NO", then may proceed with Cephalosporin use.     Current Outpatient Medications  Medication Sig Dispense Refill  . acetaminophen (TYLENOL) 325 MG tablet Take 650 mg by mouth every 6 (six) hours as needed (for pain.).    Marland Kitchen allopurinol (ZYLOPRIM) 100 MG tablet Take 100 mg by mouth daily with lunch.     Marland Kitchen aspirin 325 MG tablet Take 325 mg by mouth daily with lunch.     . AZO-CRANBERRY PO Take 1 capsule by mouth daily with lunch.     . CANNABIDIOL PO Take 15 mg by mouth daily. CBD Gummies    . clopidogrel (PLAVIX) 75 MG tablet Take 75 mg by mouth daily with lunch.     . furosemide (LASIX) 40 MG tablet Take 20 mg by mouth every other day. Every other day with lunch    . gabapentin (NEURONTIN) 100 MG capsule Take 100 mg by mouth at bedtime.     . hyoscyamine (LEVSIN SL) 0.125 MG SL tablet  Take 0.125 mg by mouth daily.     . isosorbide mononitrate (IMDUR) 60 MG 24 hr tablet Take 60 mg by mouth daily.     Marland Kitchen MAGNESIUM CITRATE PO Take 100 mg by mouth daily.     . metoprolol succinate (TOPROL-XL) 25 MG 24 hr tablet Take 25 mg by mouth daily with lunch.     . nitroGLYCERIN (NITROSTAT) 0.4 MG SL tablet Place 0.4 mg under the tongue every 5 (five) minutes as needed for chest pain.     . pantoprazole (PROTONIX) 40 MG tablet Take 40 mg by mouth daily before breakfast.     . potassium chloride (MICRO-K) 10 MEQ CR capsule Take 20 mEq by mouth in the morning and at bedtime.     . promethazine (PHENERGAN) 25 MG tablet Take 25 mg by mouth every 8 (eight) hours as needed for nausea or vomiting.     . ranolazine (RANEXA) 500 MG 12 hr tablet Take 1 tablet by mouth daily.    . riboflavin (VITAMIN B-2) 100 MG TABS tablet Take 100 mg by mouth daily with lunch.     . senna (SENOKOT) 8.6 MG tablet Take 4-6 tablets by mouth at bedtime.     . sodium  bicarbonate 650 MG tablet Take 650 mg by mouth daily with lunch.    . vitamin E 400 UNIT capsule Take 400 Units by mouth daily with lunch.      No current facility-administered medications for this visit.    OBJECTIVE: Vitals:   08/15/19 1359  BP: 111/78  Pulse: 95  Resp: 17  Temp: (!) 96.7 F (35.9 C)  SpO2: 100%     Body mass index is 19.9 kg/m.    ECOG FS:0 - Asymptomatic  General: Well-developed, well-nourished, no acute distress. Eyes: Pink conjunctiva, anicteric sclera. HEENT: Normocephalic, moist mucous membranes. Lungs: No audible wheezing or coughing. Heart: Regular rate and rhythm. Abdomen: Soft, nontender, no obvious distention. Musculoskeletal: No edema, cyanosis, or clubbing. Neuro: Alert, answering all questions appropriately. Cranial nerves grossly intact. Skin: No rashes or petechiae noted. Psych: Normal affect.  LAB RESULTS:  Lab Results  Component Value Date   NA 140 08/09/2017   K 3.9 08/09/2017   CL 111 08/09/2017   CO2 23 08/09/2017   GLUCOSE 96 08/09/2017   BUN 23 (H) 08/09/2017   CREATININE 1.52 (H) 08/09/2017   CALCIUM 8.4 (L) 08/09/2017   PROT 7.0 11/30/2006   ALBUMIN 4.4 11/30/2006   AST 19 11/30/2006   ALT 11 11/30/2006   ALKPHOS 51 11/30/2006   BILITOT 0.7 11/30/2006   GFRNONAA 34 (L) 08/09/2017   GFRAA 39 (L) 08/09/2017    Lab Results  Component Value Date   WBC 5.8 08/15/2019   NEUTROABS 3.3 08/15/2019   HGB 10.4 (L) 08/15/2019   HCT 32.2 (L) 08/15/2019   MCV 93.6 08/15/2019   PLT 263 08/15/2019   Lab Results  Component Value Date   IRON 63 08/15/2019   TIBC 249 (L) 08/15/2019   IRONPCTSAT 25 08/15/2019   Lab Results  Component Value Date   FERRITIN 102 08/15/2019     STUDIES: CARDIAC CATHETERIZATION  Result Date: 08/01/2019  Non-stenotic Ost LM to Mid LM lesion was previously treated.  Previously placed Prox RCA drug eluting stent is widely patent.  Ost Ramus lesion is 100% stenosed.  Origin to Prox Graft  lesion is 100% stenosed.  Origin to Prox Graft lesion is 100% stenosed.  LIMA and is small.  The graft exhibits mild  diffuse disease.  Prox LAD-2 lesion is 20% stenosed.  Prox LAD-1 lesion is 95% stenosed.  Previously placed Prox RCA to Mid RCA stent (unknown type) is widely patent.  Previously placed Mid RCA-1 drug eluting stent is widely patent.  Balloon angioplasty was performed.  Mid RCA-2 lesion is 50% stenosed.  1.  Three-vessel coronary artery disease with 95% stenosis proximal LAD, occluded ramus intermedius branch, with patent left main stent, patent proximal, mid and distal stents RCA, with 50% in-stent restenosis distal stent RCA 2.  Patent LIMA to LAD, occluded SVG to ramus intermedius branch, occluded SVG to OM1 3.  Left ventriculography not performed 4.  IVUS of left main stent revealed widely patent stent without in-stent restenosis Recommendations 1.  Continue medical therapy    ASSESSMENT: Iron deficiency anemia  PLAN:    1. Iron deficiency anemia: Patient's hemoglobin has trended down slightly and she is symptomatic.  Also given her significant cardiac disease, will attempt to maintain hemoglobin higher.  Due to insurance purposes, patient will now receive 200 mg IV Venofer.  She will return to clinic in 1 week for second infusion.  Patient will then return to clinic in 3 months with repeat laboratory work, further evaluation, and consideration of treatment if needed. 2.  Cardiac disease: Continue follow-up with cardiology as scheduled.    I spent a total of 30 minutes reviewing chart data, face-to-face evaluation with the patient, counseling and coordination of care as detailed above.   Patient expressed understanding and was in agreement with this plan. She also understands that She can call clinic at any time with any questions, concerns, or complaints.    Lloyd Huger, MD   08/15/2019 6:46 PM

## 2019-08-15 ENCOUNTER — Inpatient Hospital Stay (HOSPITAL_BASED_OUTPATIENT_CLINIC_OR_DEPARTMENT_OTHER): Payer: Medicare HMO | Admitting: Oncology

## 2019-08-15 ENCOUNTER — Other Ambulatory Visit: Payer: Self-pay

## 2019-08-15 ENCOUNTER — Inpatient Hospital Stay: Payer: Medicare HMO

## 2019-08-15 ENCOUNTER — Inpatient Hospital Stay: Payer: Medicare HMO | Attending: Oncology

## 2019-08-15 ENCOUNTER — Encounter: Payer: Self-pay | Admitting: Oncology

## 2019-08-15 VITALS — BP 111/78 | HR 95 | Temp 96.7°F | Resp 17 | Wt 101.9 lb

## 2019-08-15 DIAGNOSIS — Z8249 Family history of ischemic heart disease and other diseases of the circulatory system: Secondary | ICD-10-CM | POA: Insufficient documentation

## 2019-08-15 DIAGNOSIS — Z8042 Family history of malignant neoplasm of prostate: Secondary | ICD-10-CM | POA: Diagnosis not present

## 2019-08-15 DIAGNOSIS — D508 Other iron deficiency anemias: Secondary | ICD-10-CM

## 2019-08-15 DIAGNOSIS — Z79899 Other long term (current) drug therapy: Secondary | ICD-10-CM | POA: Diagnosis not present

## 2019-08-15 DIAGNOSIS — Z841 Family history of disorders of kidney and ureter: Secondary | ICD-10-CM | POA: Diagnosis not present

## 2019-08-15 DIAGNOSIS — D509 Iron deficiency anemia, unspecified: Secondary | ICD-10-CM

## 2019-08-15 DIAGNOSIS — R531 Weakness: Secondary | ICD-10-CM | POA: Diagnosis not present

## 2019-08-15 DIAGNOSIS — Z85828 Personal history of other malignant neoplasm of skin: Secondary | ICD-10-CM | POA: Insufficient documentation

## 2019-08-15 DIAGNOSIS — N183 Chronic kidney disease, stage 3 unspecified: Secondary | ICD-10-CM | POA: Insufficient documentation

## 2019-08-15 DIAGNOSIS — I2510 Atherosclerotic heart disease of native coronary artery without angina pectoris: Secondary | ICD-10-CM | POA: Diagnosis not present

## 2019-08-15 DIAGNOSIS — R5383 Other fatigue: Secondary | ICD-10-CM | POA: Insufficient documentation

## 2019-08-15 DIAGNOSIS — E785 Hyperlipidemia, unspecified: Secondary | ICD-10-CM | POA: Diagnosis not present

## 2019-08-15 DIAGNOSIS — I129 Hypertensive chronic kidney disease with stage 1 through stage 4 chronic kidney disease, or unspecified chronic kidney disease: Secondary | ICD-10-CM | POA: Diagnosis not present

## 2019-08-15 DIAGNOSIS — Z888 Allergy status to other drugs, medicaments and biological substances status: Secondary | ICD-10-CM | POA: Diagnosis not present

## 2019-08-15 LAB — IRON AND TIBC
Iron: 63 ug/dL (ref 28–170)
Saturation Ratios: 25 % (ref 10.4–31.8)
TIBC: 249 ug/dL — ABNORMAL LOW (ref 250–450)
UIBC: 186 ug/dL

## 2019-08-15 LAB — CBC WITH DIFFERENTIAL/PLATELET
Abs Immature Granulocytes: 0.04 10*3/uL (ref 0.00–0.07)
Basophils Absolute: 0.1 10*3/uL (ref 0.0–0.1)
Basophils Relative: 1 %
Eosinophils Absolute: 0.4 10*3/uL (ref 0.0–0.5)
Eosinophils Relative: 8 %
HCT: 32.2 % — ABNORMAL LOW (ref 36.0–46.0)
Hemoglobin: 10.4 g/dL — ABNORMAL LOW (ref 12.0–15.0)
Immature Granulocytes: 1 %
Lymphocytes Relative: 25 %
Lymphs Abs: 1.4 10*3/uL (ref 0.7–4.0)
MCH: 30.2 pg (ref 26.0–34.0)
MCHC: 32.3 g/dL (ref 30.0–36.0)
MCV: 93.6 fL (ref 80.0–100.0)
Monocytes Absolute: 0.5 10*3/uL (ref 0.1–1.0)
Monocytes Relative: 9 %
Neutro Abs: 3.3 10*3/uL (ref 1.7–7.7)
Neutrophils Relative %: 56 %
Platelets: 263 10*3/uL (ref 150–400)
RBC: 3.44 MIL/uL — ABNORMAL LOW (ref 3.87–5.11)
RDW: 14 % (ref 11.5–15.5)
WBC: 5.8 10*3/uL (ref 4.0–10.5)
nRBC: 0 % (ref 0.0–0.2)

## 2019-08-15 LAB — FERRITIN: Ferritin: 102 ng/mL (ref 11–307)

## 2019-08-15 MED ORDER — SODIUM CHLORIDE 0.9 % IV SOLN: 200.0000 mg | Freq: Once | INTRAVENOUS | Status: DC

## 2019-08-15 MED ORDER — IRON SUCROSE 20 MG/ML IV SOLN
200.0000 mg | Freq: Once | INTRAVENOUS | Status: AC
  Administered 2019-08-15: 200 mg via INTRAVENOUS
  Filled 2019-08-15: qty 10

## 2019-08-15 MED ORDER — SODIUM CHLORIDE 0.9 % IV SOLN
Freq: Once | INTRAVENOUS | Status: AC
  Filled 2019-08-15: qty 250

## 2019-08-20 ENCOUNTER — Encounter (INDEPENDENT_AMBULATORY_CARE_PROVIDER_SITE_OTHER): Payer: Self-pay

## 2019-08-22 ENCOUNTER — Other Ambulatory Visit: Payer: Self-pay

## 2019-08-22 ENCOUNTER — Inpatient Hospital Stay: Payer: Medicare HMO

## 2019-08-22 VITALS — BP 106/61 | HR 74 | Temp 97.0°F | Resp 18

## 2019-08-22 DIAGNOSIS — D509 Iron deficiency anemia, unspecified: Secondary | ICD-10-CM | POA: Diagnosis not present

## 2019-08-22 DIAGNOSIS — D508 Other iron deficiency anemias: Secondary | ICD-10-CM

## 2019-08-22 MED ORDER — SODIUM CHLORIDE 0.9 % IV SOLN
Freq: Once | INTRAVENOUS | Status: AC
  Filled 2019-08-22: qty 250

## 2019-08-22 MED ORDER — SODIUM CHLORIDE 0.9 % IV SOLN: 200.0000 mg | Freq: Once | INTRAVENOUS | Status: DC

## 2019-08-22 MED ORDER — IRON SUCROSE 20 MG/ML IV SOLN
200.0000 mg | Freq: Once | INTRAVENOUS | Status: AC
  Administered 2019-08-22: 200 mg via INTRAVENOUS
  Filled 2019-08-22: qty 10

## 2019-11-13 ENCOUNTER — Other Ambulatory Visit: Payer: Medicare HMO

## 2019-11-14 ENCOUNTER — Other Ambulatory Visit: Payer: Medicare HMO

## 2019-11-14 ENCOUNTER — Ambulatory Visit: Payer: Medicare HMO | Admitting: Oncology

## 2019-11-14 ENCOUNTER — Ambulatory Visit: Payer: Medicare HMO

## 2019-12-09 DIAGNOSIS — E876 Hypokalemia: Secondary | ICD-10-CM | POA: Insufficient documentation

## 2019-12-13 ENCOUNTER — Other Ambulatory Visit: Payer: Self-pay | Admitting: *Deleted

## 2019-12-13 DIAGNOSIS — D509 Iron deficiency anemia, unspecified: Secondary | ICD-10-CM

## 2019-12-13 NOTE — Progress Notes (Signed)
[place]  Telephone:(336) [phone] Fax:(336) [phone]  ID: [name] OB: 02/26/1946  MR#: [id number]  VOJ#:[id number]  Patient Care Team: Tracie Harrier, MD as PCP - General (Internal Medicine)  CHIEF COMPLAINT: Iron deficiency anemia  INTERVAL HISTORY: Patient returns to clinic today for repeat laboratory work, further evaluation, and continuation of additional IV Venofer.  She continues to have chronic weakness and fatigue, but otherwise feels well.  She has no neurologic complaints.  She denies any recent fevers or illnesses.  She has a good appetite and denies weight loss.  She has no chest pain, shortness of breath, cough, or hemoptysis.  She denies any nausea, vomiting, constipation, or diarrhea.  She has no melena or hematochezia.  She has no urinary complaints.  Patient offers no further specific complaints today.  REVIEW OF SYSTEMS:   Review of Systems  Constitutional: Positive for malaise/fatigue. Negative for fever and weight loss.  Respiratory: Negative.  Negative for cough, hemoptysis and shortness of breath.   Cardiovascular: Negative.  Negative for chest pain and leg swelling.  Gastrointestinal: Negative.  Negative for abdominal pain, blood in stool and melena.  Genitourinary: Negative.  Negative for dysuria and hematuria.  Musculoskeletal: Negative.  Negative for back pain.  Skin: Negative.  Negative for rash.  Neurological: Positive for weakness. Negative for dizziness, focal weakness and headaches.  Psychiatric/Behavioral: Negative.  The patient is not nervous/anxious.     As per HPI. Otherwise, a complete review of systems is negative.  PAST MEDICAL HISTORY: Past Medical History:  Diagnosis Date   Acid reflux    Anemia    CAD (coronary artery disease)    Chronic constipation    Chronic kidney disease    stage 3 chronic kidney disease   Eczema    H/O heart artery stent    Hyperlipidemia    Hypertension    IBS  (irritable bowel syndrome) 2004   Migraine    Mitral valve prolapse    Peptic ulcer 1989   Skin cancer     PAST SURGICAL HISTORY: Past Surgical History:  Procedure Laterality Date   ABDOMINAL HYSTERECTOMY     APPENDECTOMY     CARDIAC CATHETERIZATION N/A 10/21/2015   Procedure: Left Heart Cath and Coronary Angiography;  Surgeon: Isaias Cowman, MD;  Location: [facility];  Service: Cardiovascular;  Laterality: N/A;   CARDIAC CATHETERIZATION N/A 10/21/2015   Procedure: Coronary Stent Intervention;  Surgeon: Isaias Cowman, MD;  Location: [facility];  Service: Cardiovascular;  Laterality: N/A;   CARDIAC CATHETERIZATION Left 11/24/2015   Procedure: Coronary Stent Intervention;  Surgeon: Isaias Cowman, MD;  Location: [facility];  Service: Cardiovascular;  Laterality: Left;   CARDIAC SURGERY  2007   cardiac stent place   CATARACT EXTRACTION  2012   CHOLECYSTECTOMY     CORONARY ANGIOPLASTY WITH STENT PLACEMENT  2013   CORONARY ARTERY BYPASS GRAFT  2008   CORONARY STENT INTERVENTION N/A 07/04/2017   Procedure: CORONARY STENT INTERVENTION;  Surgeon: Isaias Cowman, MD;  Location: [facility];  Service: Cardiovascular;  Laterality: N/A;   CORONARY STENT INTERVENTION N/A 08/08/2017   Procedure: CORONARY STENT INTERVENTION;  Surgeon: Isaias Cowman, MD;  Location: [facility];  Service: Cardiovascular;  Laterality: N/A;   CYSTOSCOPY W/ RETROGRADES Bilateral 12/29/2014   Procedure: CYSTOSCOPY WITH RETROGRADE PYELOGRAM;  Surgeon: Collier Flowers, MD;  Location: [facility];  Service: Urology;  Laterality: Bilateral;   CYSTOSCOPY WITH BIOPSY N/A 12/29/2014   Procedure:  CYSTOSCOPY WITH BIOPSY;  Surgeon: Collier Flowers, MD;  Location: [facility];  Service: Urology;  Laterality: N/A;   HAND SURGERY  1998   LEFT HEART CATH AND CORONARY ANGIOGRAPHY Left 08/08/2017   Procedure: LEFT HEART CATH AND CORONARY ANGIOGRAPHY;   Surgeon: Isaias Cowman, MD;  Location: [facility];  Service: Cardiovascular;  Laterality: Left;   LEFT HEART CATH AND CORONARY ANGIOGRAPHY Left 08/01/2019   Procedure: LEFT HEART CATH AND CORONARY ANGIOGRAPHY;  Surgeon: Isaias Cowman, MD;  Location: [facility];  Service: Cardiovascular;  Laterality: Left;   LEFT HEART CATH AND CORS/GRAFTS ANGIOGRAPHY N/A 07/04/2017   Procedure: LEFT HEART CATH AND CORS/GRAFTS ANGIOGRAPHY;  Surgeon: Isaias Cowman, MD;  Location: [facility];  Service: Cardiovascular;  Laterality: N/A;   SIGMOID RESECTION / RECTOPEXY  2006   SKIN CANCER EXCISION  2013   TENDON REPAIR     right elbow   TRIGGER FINGER RELEASE      FAMILY HISTORY: Family History  Problem Relation Age of Onset   Kidney Stones Father    Prostate cancer Father    Heart disease Father    Kidney Stones Brother    Heart disease Mother     ADVANCED DIRECTIVES (Y/N):  N  HEALTH MAINTENANCE: Social History   Tobacco Use   Smoking status: Never Smoker   Smokeless tobacco: Never Used  Vaping Use   Vaping Use: Never used  Substance Use Topics   Alcohol use: No   Drug use: No     Colonoscopy:  PAP:  Bone density:  Lipid panel:  Allergies  Allergen Reactions   Prochlorperazine Nausea And Vomiting, Other (See Comments) and Nausea Only    Severe vomiting   Ezetimibe Other (See Comments)    Muscle Pain   Statins Other (See Comments)    Muscle Pain   Tape Other (See Comments)    Pulls my skin off when the tape is removed. Use paper tape only.   Cephalexin Rash   Metoclopramide Rash and Other (See Comments)    Elevates BP, face draws to one side   Penicillins Rash and Other (See Comments)    Has patient had a PCN reaction causing immediate rash, facial/tongue/throat swelling, SOB or lightheadedness with hypotension: Yes Has patient had a PCN reaction causing severe rash involving mucus membranes or skin necrosis:  Yes Has patient had a PCN reaction that required hospitalization No Has patient had a PCN reaction occurring within the last 10 years: No If all of the above answers are "NO", then may proceed with Cephalosporin use.     Current Outpatient Medications  Medication Sig Dispense Refill   acetaminophen (TYLENOL) 325 MG tablet Take 650 mg by mouth every 6 (six) hours as needed (for pain.).     allopurinol (ZYLOPRIM) 100 MG tablet Take 100 mg by mouth daily with lunch.      aspirin 325 MG tablet Take 325 mg by mouth daily with lunch.      AZO-CRANBERRY PO Take 1 capsule by mouth daily with lunch.      CANNABIDIOL PO Take 15 mg by mouth daily. CBD Gummies     clopidogrel (PLAVIX) 75 MG tablet Take 75 mg by mouth daily with lunch.      furosemide (LASIX) 40 MG tablet Take 20 mg by mouth every other day. Every other day with lunch     gabapentin (NEURONTIN) 100 MG capsule Take 100 mg by mouth at bedtime.  hyoscyamine (LEVSIN SL) 0.125 MG SL tablet Take 0.125 mg by mouth daily.      isosorbide mononitrate (IMDUR) 60 MG 24 hr tablet Take 60 mg by mouth daily.      MAGNESIUM CITRATE PO Take 100 mg by mouth daily.      metoprolol succinate (TOPROL-XL) 25 MG 24 hr tablet Take 25 mg by mouth daily with lunch.      nitroGLYCERIN (NITROSTAT) 0.4 MG SL tablet Place 0.4 mg under the tongue every 5 (five) minutes as needed for chest pain.      pantoprazole (PROTONIX) 40 MG tablet Take 40 mg by mouth daily before breakfast.      potassium chloride (MICRO-K) 10 MEQ CR capsule Take 20 mEq by mouth in the morning and at bedtime.      promethazine (PHENERGAN) 25 MG tablet Take 25 mg by mouth every 8 (eight) hours as needed for nausea or vomiting.      ranolazine (RANEXA) 500 MG 12 hr tablet Take 1 tablet by mouth daily.     riboflavin (VITAMIN B-2) 100 MG TABS tablet Take 100 mg by mouth daily with lunch.      senna (SENOKOT) 8.6 MG tablet Take 4-6 tablets by mouth at bedtime.      sodium  bicarbonate 650 MG tablet Take 650 mg by mouth daily with lunch.     vitamin E 400 UNIT capsule Take 400 Units by mouth daily with lunch.      No current facility-administered medications for this visit.    OBJECTIVE: Vitals:   12/17/19 1314  BP: 103/67  Pulse: 80  Temp: 97.6 F (36.4 C)  SpO2: 99%     Body mass index is 19.86 kg/m.    ECOG FS:0 - Asymptomatic  General: Well-developed, well-nourished, no acute distress. Eyes: Pink conjunctiva, anicteric sclera. HEENT: Normocephalic, moist mucous membranes. Lungs: No audible wheezing or coughing. Heart: Regular rate and rhythm. Abdomen: Soft, nontender, no obvious distention. Musculoskeletal: No edema, cyanosis, or clubbing. Neuro: Alert, answering all questions appropriately. Cranial nerves grossly intact. Skin: No rashes or petechiae noted. Psych: Normal affect.   LAB RESULTS:  Lab Results  Component Value Date   NA 140 08/09/2017   K 3.9 08/09/2017   CL 111 08/09/2017   CO2 23 08/09/2017   GLUCOSE 96 08/09/2017   BUN 23 (H) 08/09/2017   CREATININE 1.52 (H) 08/09/2017   CALCIUM 8.4 (L) 08/09/2017   PROT 7.0 11/30/2006   ALBUMIN 4.4 11/30/2006   AST 19 11/30/2006   ALT 11 11/30/2006   ALKPHOS 51 11/30/2006   BILITOT 0.7 11/30/2006   GFRNONAA 34 (L) 08/09/2017   GFRAA 39 (L) 08/09/2017    Lab Results  Component Value Date   WBC 6.3 12/16/2019   NEUTROABS 3.6 12/16/2019   HGB 10.1 (L) 12/16/2019   HCT 30.0 (L) 12/16/2019   MCV 91.7 12/16/2019   PLT 266 12/16/2019   Lab Results  Component Value Date   IRON 77 12/16/2019   TIBC 223 (L) 12/16/2019   IRONPCTSAT 35 (H) 12/16/2019   Lab Results  Component Value Date   FERRITIN 201 12/16/2019     STUDIES: No results found.  ASSESSMENT: Iron deficiency anemia  PLAN:    1. Iron deficiency anemia: Patient's hemoglobin remains decreased, but iron stores are now within normal limits.  Given her significant cardiac disease, will continue to monitor  closely and try to maintain hemoglobin greater than 10.0.  She does not require additional Venofer today, but likely  will require infusions in the future.  Also given her underlying chronic renal insufficiency, patient may benefit from Retacrit in the future.  Return to clinic in 3 months with repeat laboratory, further evaluation, and continuation of treatment if needed.   2.  Cardiac disease: Continue follow-up with cardiology as scheduled.   3.  Chronic renal insufficiency: Continue follow-up with nephrology as scheduled.  Patient expressed understanding and was in agreement with this plan. She also understands that She can call clinic at any time with any questions, concerns, or complaints.    Lloyd Huger, MD   12/17/2019 5:03 PM

## 2019-12-13 NOTE — Progress Notes (Signed)
c 

## 2019-12-16 ENCOUNTER — Other Ambulatory Visit: Payer: Self-pay

## 2019-12-16 ENCOUNTER — Inpatient Hospital Stay: Payer: Medicare HMO | Attending: Oncology

## 2019-12-16 DIAGNOSIS — Z951 Presence of aortocoronary bypass graft: Secondary | ICD-10-CM | POA: Diagnosis not present

## 2019-12-16 DIAGNOSIS — Z79899 Other long term (current) drug therapy: Secondary | ICD-10-CM | POA: Insufficient documentation

## 2019-12-16 DIAGNOSIS — I341 Nonrheumatic mitral (valve) prolapse: Secondary | ICD-10-CM | POA: Insufficient documentation

## 2019-12-16 DIAGNOSIS — I129 Hypertensive chronic kidney disease with stage 1 through stage 4 chronic kidney disease, or unspecified chronic kidney disease: Secondary | ICD-10-CM | POA: Insufficient documentation

## 2019-12-16 DIAGNOSIS — I2510 Atherosclerotic heart disease of native coronary artery without angina pectoris: Secondary | ICD-10-CM | POA: Insufficient documentation

## 2019-12-16 DIAGNOSIS — D509 Iron deficiency anemia, unspecified: Secondary | ICD-10-CM | POA: Diagnosis not present

## 2019-12-16 DIAGNOSIS — N183 Chronic kidney disease, stage 3 unspecified: Secondary | ICD-10-CM | POA: Insufficient documentation

## 2019-12-16 LAB — CBC WITH DIFFERENTIAL/PLATELET
Abs Immature Granulocytes: 0.05 10*3/uL (ref 0.00–0.07)
Basophils Absolute: 0.1 10*3/uL (ref 0.0–0.1)
Basophils Relative: 1 %
Eosinophils Absolute: 0.3 10*3/uL (ref 0.0–0.5)
Eosinophils Relative: 4 %
HCT: 30 % — ABNORMAL LOW (ref 36.0–46.0)
Hemoglobin: 10.1 g/dL — ABNORMAL LOW (ref 12.0–15.0)
Immature Granulocytes: 1 %
Lymphocytes Relative: 27 %
Lymphs Abs: 1.7 10*3/uL (ref 0.7–4.0)
MCH: 30.9 pg (ref 26.0–34.0)
MCHC: 33.7 g/dL (ref 30.0–36.0)
MCV: 91.7 fL (ref 80.0–100.0)
Monocytes Absolute: 0.6 10*3/uL (ref 0.1–1.0)
Monocytes Relative: 10 %
Neutro Abs: 3.6 10*3/uL (ref 1.7–7.7)
Neutrophils Relative %: 57 %
Platelets: 266 10*3/uL (ref 150–400)
RBC: 3.27 MIL/uL — ABNORMAL LOW (ref 3.87–5.11)
RDW: 14.4 % (ref 11.5–15.5)
WBC: 6.3 10*3/uL (ref 4.0–10.5)
nRBC: 0 % (ref 0.0–0.2)

## 2019-12-16 LAB — IRON AND TIBC
Iron: 77 ug/dL (ref 28–170)
Saturation Ratios: 35 % — ABNORMAL HIGH (ref 10.4–31.8)
TIBC: 223 ug/dL — ABNORMAL LOW (ref 250–450)
UIBC: 146 ug/dL

## 2019-12-16 LAB — FERRITIN: Ferritin: 201 ng/mL (ref 11–307)

## 2019-12-17 ENCOUNTER — Inpatient Hospital Stay: Payer: Medicare HMO | Admitting: Oncology

## 2019-12-17 ENCOUNTER — Encounter: Payer: Self-pay | Admitting: Oncology

## 2019-12-17 ENCOUNTER — Inpatient Hospital Stay: Payer: Medicare HMO

## 2019-12-17 VITALS — BP 103/67 | HR 80 | Temp 97.6°F | Wt 101.7 lb

## 2019-12-17 DIAGNOSIS — D509 Iron deficiency anemia, unspecified: Secondary | ICD-10-CM

## 2019-12-17 NOTE — Progress Notes (Signed)
Pt feeling tired but she states that she is a balance with heart issues, kidney issues and colon issues. Her kidney function has dropped down to GFR18. She eats good and drinks fluids and has to take 4-6 senna a day due to part of colon taken out- this is long term issue.

## 2020-01-20 ENCOUNTER — Ambulatory Visit (INDEPENDENT_AMBULATORY_CARE_PROVIDER_SITE_OTHER): Payer: Medicare HMO | Admitting: Nurse Practitioner

## 2020-01-20 ENCOUNTER — Encounter (INDEPENDENT_AMBULATORY_CARE_PROVIDER_SITE_OTHER): Payer: Medicare HMO

## 2020-01-23 ENCOUNTER — Encounter (INDEPENDENT_AMBULATORY_CARE_PROVIDER_SITE_OTHER): Payer: Self-pay | Admitting: Vascular Surgery

## 2020-01-23 ENCOUNTER — Ambulatory Visit (INDEPENDENT_AMBULATORY_CARE_PROVIDER_SITE_OTHER): Payer: Medicare HMO | Admitting: Vascular Surgery

## 2020-01-23 ENCOUNTER — Ambulatory Visit (INDEPENDENT_AMBULATORY_CARE_PROVIDER_SITE_OTHER): Payer: Medicare HMO

## 2020-01-23 ENCOUNTER — Encounter (INDEPENDENT_AMBULATORY_CARE_PROVIDER_SITE_OTHER): Payer: Medicare HMO | Admitting: Vascular Surgery

## 2020-01-23 ENCOUNTER — Other Ambulatory Visit: Payer: Self-pay

## 2020-01-23 VITALS — BP 95/57 | HR 80 | Resp 19 | Ht 60.0 in | Wt 100.0 lb

## 2020-01-23 DIAGNOSIS — E782 Mixed hyperlipidemia: Secondary | ICD-10-CM | POA: Diagnosis not present

## 2020-01-23 DIAGNOSIS — I6523 Occlusion and stenosis of bilateral carotid arteries: Secondary | ICD-10-CM

## 2020-01-23 DIAGNOSIS — I25118 Atherosclerotic heart disease of native coronary artery with other forms of angina pectoris: Secondary | ICD-10-CM | POA: Diagnosis not present

## 2020-01-23 DIAGNOSIS — I701 Atherosclerosis of renal artery: Secondary | ICD-10-CM

## 2020-01-23 DIAGNOSIS — K219 Gastro-esophageal reflux disease without esophagitis: Secondary | ICD-10-CM

## 2020-01-23 NOTE — Progress Notes (Signed)
MRN : [id number]  [name] is a 74 y.o. (01/25/1946) female who presents with chief complaint of  Chief Complaint  Patient presents with  . Follow-up  .  History of Present Illness:   The patient is seen for follow up evaluation of carotid stenosis as well as presumed renal artery stenosis. The carotid stenosis followed by ultrasound.   The patient denies amaurosis fugax. There is no recent history of TIA symptoms or focal motor deficits. There is no prior documented CVA.  The patient is taking enteric-coated aspirin 81 mg daily.  There is no history of migraine headaches. There is no history of seizures.  The patient has a history of coronary artery disease, no recent episodes of angina or shortness of breath. The patient denies PAD or claudication symptoms. There is a history of hyperlipidemia which is being treated with a statin.   Carotid Duplex done today shows 40 to 59% right internal carotid artery stenosis and 60 to 79% left internal carotid artery stenosis.  No change compared to the previous study.  Current Meds  Medication Sig  . acetaminophen (TYLENOL) 325 MG tablet Take 650 mg by mouth every 6 (six) hours as needed (for pain.).  Marland Kitchen allopurinol (ZYLOPRIM) 100 MG tablet Take 100 mg by mouth daily with lunch.   Marland Kitchen aspirin 325 MG tablet Take 325 mg by mouth daily with lunch.   . AZO-CRANBERRY PO Take 1 capsule by mouth daily with lunch.   . CANNABIDIOL PO Take 15 mg by mouth daily. CBD Gummies  . clopidogrel (PLAVIX) 75 MG tablet Take 75 mg by mouth daily with lunch.   . furosemide (LASIX) 40 MG tablet Take 20 mg by mouth every other day. Every other day with lunch  . gabapentin (NEURONTIN) 100 MG capsule Take 100 mg by mouth at bedtime.   . hyoscyamine (LEVSIN SL) 0.125 MG SL tablet Take 0.125 mg by mouth daily.   . isosorbide mononitrate (IMDUR) 60 MG 24 hr tablet Take 60 mg by mouth daily.   Marland Kitchen MAGNESIUM CITRATE PO Take 100 mg by mouth daily.   .  metoprolol succinate (TOPROL-XL) 25 MG 24 hr tablet Take 25 mg by mouth daily with lunch.   . nitroGLYCERIN (NITROSTAT) 0.4 MG SL tablet Place 0.4 mg under the tongue every 5 (five) minutes as needed for chest pain.   . pantoprazole (PROTONIX) 40 MG tablet Take 40 mg by mouth daily before breakfast.   . potassium chloride (MICRO-K) 10 MEQ CR capsule Take 20 mEq by mouth in the morning and at bedtime.   . promethazine (PHENERGAN) 25 MG tablet Take 25 mg by mouth every 8 (eight) hours as needed for nausea or vomiting.   . ranolazine (RANEXA) 500 MG 12 hr tablet Take 1 tablet by mouth daily.  . riboflavin (VITAMIN B-2) 100 MG TABS tablet Take 100 mg by mouth daily with lunch.   . senna (SENOKOT) 8.6 MG tablet Take 4-6 tablets by mouth at bedtime.   . sodium bicarbonate 650 MG tablet Take 650 mg by mouth daily with lunch.  . vitamin E 400 UNIT capsule Take 400 Units by mouth daily with lunch.     Past Medical History:  Diagnosis Date  . Acid reflux   . Anemia   . CAD (coronary artery disease)   . Chronic constipation   . Chronic kidney disease    stage 3 chronic kidney disease  . Eczema   . H/O heart artery stent   .  Hyperlipidemia   . Hypertension   . IBS (irritable bowel syndrome) 2004  . Migraine   . Mitral valve prolapse   . Peptic ulcer 1989  . Skin cancer     Past Surgical History:  Procedure Laterality Date  . ABDOMINAL HYSTERECTOMY    . APPENDECTOMY    . CARDIAC CATHETERIZATION N/A 10/21/2015   Procedure: Left Heart Cath and Coronary Angiography;  Surgeon: Isaias Cowman, MD;  Location: [facility];  Service: Cardiovascular;  Laterality: N/A;  . CARDIAC CATHETERIZATION N/A 10/21/2015   Procedure: Coronary Stent Intervention;  Surgeon: Isaias Cowman, MD;  Location: [facility];  Service: Cardiovascular;  Laterality: N/A;  . CARDIAC CATHETERIZATION Left 11/24/2015   Procedure: Coronary Stent Intervention;  Surgeon: Isaias Cowman, MD;   Location: [place] [facility];  Service: Cardiovascular;  Laterality: Left;  . CARDIAC SURGERY  2007   cardiac stent place  . CATARACT EXTRACTION  2012  . CHOLECYSTECTOMY    . CORONARY ANGIOPLASTY WITH STENT PLACEMENT  2013  . CORONARY ARTERY BYPASS GRAFT  2008  . CORONARY STENT INTERVENTION N/A 07/04/2017   Procedure: CORONARY STENT INTERVENTION;  Surgeon: Isaias Cowman, MD;  Location: [facility];  Service: Cardiovascular;  Laterality: N/A;  . CORONARY STENT INTERVENTION N/A 08/08/2017   Procedure: CORONARY STENT INTERVENTION;  Surgeon: Isaias Cowman, MD;  Location: [place] [facility];  Service: Cardiovascular;  Laterality: N/A;  . CYSTOSCOPY W/ RETROGRADES Bilateral 12/29/2014   Procedure: CYSTOSCOPY WITH RETROGRADE PYELOGRAM;  Surgeon: Collier Flowers, MD;  Location: [facility];  Service: Urology;  Laterality: Bilateral;  . CYSTOSCOPY WITH BIOPSY N/A 12/29/2014   Procedure: CYSTOSCOPY WITH BIOPSY;  Surgeon: Collier Flowers, MD;  Location: [facility];  Service: Urology;  Laterality: N/A;  . HAND SURGERY  1998  . LEFT HEART CATH AND CORONARY ANGIOGRAPHY Left 08/08/2017   Procedure: LEFT HEART CATH AND CORONARY ANGIOGRAPHY;  Surgeon: Isaias Cowman, MD;  Location: [facility] [facility];  Service: Cardiovascular;  Laterality: Left;  . LEFT HEART CATH AND CORONARY ANGIOGRAPHY Left 08/01/2019   Procedure: LEFT HEART CATH AND CORONARY ANGIOGRAPHY;  Surgeon: Isaias Cowman, MD;  Location: [place] [facility];  Service: Cardiovascular;  Laterality: Left;  . LEFT HEART CATH AND CORS/GRAFTS ANGIOGRAPHY N/A 07/04/2017   Procedure: LEFT HEART CATH AND CORS/GRAFTS ANGIOGRAPHY;  Surgeon: Isaias Cowman, MD;  Location: [facility];  Service: Cardiovascular;  Laterality: N/A;  . SIGMOID RESECTION / RECTOPEXY  2006  . SKIN CANCER EXCISION  2013  . TENDON REPAIR     right elbow  . TRIGGER FINGER RELEASE      Social History Social History   Tobacco Use  .  Smoking status: Never Smoker  . Smokeless tobacco: Never Used  Vaping Use  . Vaping Use: Never used  Substance Use Topics  . Alcohol use: No  . Drug use: No    Family History Family History  Problem Relation Age of Onset  . Kidney Stones Father   . Prostate cancer Father   . Heart disease Father   . Kidney Stones Brother   . Heart disease Mother     Allergies  Allergen Reactions  . Prochlorperazine Nausea And Vomiting, Other (See Comments) and Nausea Only    Severe vomiting  . Ezetimibe Other (See Comments)    Muscle Pain  . Statins Other (See Comments)    Muscle Pain  . Tape Other (See Comments)    Pulls my skin off when the  tape is removed. Use paper tape only.  . Cephalexin Rash  . Metoclopramide Rash and Other (See Comments)    Elevates BP, face draws to one side  . Penicillins Rash and Other (See Comments)    Has patient had a PCN reaction causing immediate rash, facial/tongue/throat swelling, SOB or lightheadedness with hypotension: Yes Has patient had a PCN reaction causing severe rash involving mucus membranes or skin necrosis: Yes Has patient had a PCN reaction that required hospitalization No Has patient had a PCN reaction occurring within the last 10 years: No If all of the above answers are "NO", then may proceed with Cephalosporin use.      REVIEW OF SYSTEMS (Negative unless checked)  Constitutional: [] Weight loss  [] Fever  [] Chills Cardiac: [] Chest pain   [] Chest pressure   [] Palpitations   [] Shortness of breath when laying flat   [] Shortness of breath with exertion. Vascular:  [] Pain in legs with walking   [] Pain in legs at rest  [] History of DVT   [] Phlebitis   [] Swelling in legs   [] Varicose veins   [] Non-healing ulcers Pulmonary:   [] Uses home oxygen   [] Productive cough   [] Hemoptysis   [] Wheeze  [] COPD   [] Asthma Neurologic:  [] Dizziness   [] Seizures   [] History of stroke   [] History of TIA  [] Aphasia   [] Vissual changes   [] Weakness or numbness in  arm   [] Weakness or numbness in leg Musculoskeletal:   [] Joint swelling   [] Joint pain   [] Low back pain Hematologic:  [] Easy bruising  [] Easy bleeding   [] Hypercoagulable state   [] Anemic Gastrointestinal:  [] Diarrhea   [] Vomiting  [x] Gastroesophageal reflux/heartburn   [] Difficulty swallowing. Genitourinary:  [] Chronic kidney disease   [] Difficult urination  [] Frequent urination   [] Blood in urine Skin:  [] Rashes   [] Ulcers  Psychological:  [] History of anxiety   []  History of major depression.  Physical Examination  Vitals:   01/23/20 1035  BP: (!) 95/57  Pulse: 80  Resp: 19  Weight: 100 lb (45.4 kg)  Height: 5' (1.524 m)   Body mass index is 19.53 kg/m. Gen: WD/WN, NAD Head: [place]/AT, No temporalis wasting.  Ear/Nose/Throat: Hearing grossly intact, nares w/o erythema or drainage Eyes: PER, EOMI, sclera nonicteric.  Neck: Supple, no large masses.   Pulmonary:  Good air movement, no audible wheezing bilaterally, no use of accessory muscles.  Cardiac: RRR, no JVD Vascular:  Left carotid bruit Vessel Right Left  Radial Palpable Palpable  Carotid Palpable Palpable  Gastrointestinal: Non-distended. No guarding/no peritoneal signs.  Musculoskeletal: M/S 5/5 throughout.  No deformity or atrophy.  Neurologic: CN 2-12 intact. Symmetrical.  Speech is fluent. Motor exam as listed above. Psychiatric: Judgment intact, Mood & affect appropriate for pt's clinical situation. Dermatologic: No rashes or ulcers noted.  No changes consistent with cellulitis.   CBC Lab Results  Component Value Date   WBC 6.3 12/16/2019   HGB 10.1 (L) 12/16/2019   HCT 30.0 (L) 12/16/2019   MCV 91.7 12/16/2019   PLT 266 12/16/2019    BMET    Component Value Date/Time   NA 140 08/09/2017 0409   NA 137 05/24/2013 0357   K 3.9 08/09/2017 0409   K 2.3 (LL) 05/24/2013 0357   CL 111 08/09/2017 0409   CL 97 (L) 05/24/2013 0357   CO2 23 08/09/2017 0409   CO2 36 (H) 05/24/2013 0357   GLUCOSE 96 08/09/2017  0409   GLUCOSE 91 05/24/2013 0357   BUN 23 (H) 08/09/2017 0409  BUN 21 (H) 05/24/2013 0357   CREATININE 1.52 (H) 08/09/2017 0409   CREATININE 1.32 (H) 05/24/2013 0357   CALCIUM 8.4 (L) 08/09/2017 0409   CALCIUM 7.9 (L) 05/24/2013 0357   GFRNONAA 34 (L) 08/09/2017 0409   GFRNONAA 42 (L) 05/24/2013 0357   GFRAA 39 (L) 08/09/2017 0409   GFRAA 49 (L) 05/24/2013 0357   CrCl cannot be calculated (Patient's most recent lab result is older than the maximum 21 days allowed.).  COAG Lab Results  Component Value Date   INR 1.5 12/04/2006   INR 0.9 11/30/2006    Radiology No results found.   Assessment/Plan 1. Bilateral carotid artery stenosis Recommend:  Given the patient's asymptomatic subcritical stenosis no further invasive testing or surgery at this time.  Duplex ultrasound shows 40 to 59% stenosis of the right internal carotid artery and 60 to 79% stenosis of the left internal carotid artery.  Continue antiplatelet therapy as prescribed Continue management of CAD, HTN and Hyperlipidemia Healthy heart diet,  encouraged exercise at least 4 times per week Follow up in 6 months with duplex ultrasound and physical exam   - VAS US CAROTID; Future  2. Renal artery stenosis (HCC) Given patient's arterial disease optimal control of the patient's hypertension is important.  BP is acceptable today and the fact of the matter is she has relatively low blood pressure and once again does not appear to have hypertension SBP of 95 today.  The patient's vital signs and noninvasive studies support the renal arteries are not significantly affected by plaque.  No invasive studies or intervention is indicated at this time.  The patient will continue the current medications, no changes at this time.  The primary medical service will continue aggressive antihypertensive therapy as per the AHA guidelines   3. Coronary artery disease of native artery of native heart with stable angina  pectoris (HCC) Continue cardiac and antihypertensive medications as already ordered and reviewed, no changes at this time.  Continue statin as ordered and reviewed, no changes at this time  Nitrates PRN for chest pain   4. Mixed hyperlipidemia Continue statin as ordered and reviewed, no changes at this time   5. Gastro-esophageal reflux disease without esophagitis Continue PPI as already ordered, this medication has been reviewed and there are no changes at this time.  Avoidence of caffeine and alcohol  Moderate elevation of the head of the bed   Hortencia Pilar, MD  01/23/2020 10:41 AM

## 2020-03-16 ENCOUNTER — Telehealth: Payer: Self-pay | Admitting: Oncology

## 2020-03-16 NOTE — Telephone Encounter (Signed)
Called patient and left VM that we need her to contact us in reference to her appts on 11/15 and 11/16. Insurance is requiring patient to have new labs for possible Retacrit inj.

## 2020-03-17 ENCOUNTER — Telehealth: Payer: Self-pay | Admitting: Oncology

## 2020-03-17 NOTE — Telephone Encounter (Signed)
2nd attempt to reach pt to have her come in to have her labs completed prior to her scheduled 11/15 visit. Per Aleen Sells will need new labs to submit to insurance company for Retacrit inj approval.

## 2020-03-18 ENCOUNTER — Other Ambulatory Visit: Payer: Self-pay

## 2020-03-18 ENCOUNTER — Inpatient Hospital Stay: Payer: Medicare HMO | Attending: Oncology

## 2020-03-18 DIAGNOSIS — R5383 Other fatigue: Secondary | ICD-10-CM | POA: Insufficient documentation

## 2020-03-18 DIAGNOSIS — I129 Hypertensive chronic kidney disease with stage 1 through stage 4 chronic kidney disease, or unspecified chronic kidney disease: Secondary | ICD-10-CM | POA: Insufficient documentation

## 2020-03-18 DIAGNOSIS — N183 Chronic kidney disease, stage 3 unspecified: Secondary | ICD-10-CM | POA: Diagnosis present

## 2020-03-18 DIAGNOSIS — R519 Headache, unspecified: Secondary | ICD-10-CM | POA: Insufficient documentation

## 2020-03-18 DIAGNOSIS — R42 Dizziness and giddiness: Secondary | ICD-10-CM | POA: Insufficient documentation

## 2020-03-18 DIAGNOSIS — D509 Iron deficiency anemia, unspecified: Secondary | ICD-10-CM

## 2020-03-18 DIAGNOSIS — R531 Weakness: Secondary | ICD-10-CM | POA: Diagnosis not present

## 2020-03-18 LAB — IRON AND TIBC
Iron: 91 ug/dL (ref 28–170)
Saturation Ratios: 40 % — ABNORMAL HIGH (ref 10.4–31.8)
TIBC: 228 ug/dL — ABNORMAL LOW (ref 250–450)
UIBC: 137 ug/dL

## 2020-03-18 LAB — CBC WITH DIFFERENTIAL/PLATELET
Abs Immature Granulocytes: 0.08 10*3/uL — ABNORMAL HIGH (ref 0.00–0.07)
Basophils Absolute: 0.1 10*3/uL (ref 0.0–0.1)
Basophils Relative: 1 %
Eosinophils Absolute: 0.2 10*3/uL (ref 0.0–0.5)
Eosinophils Relative: 2 %
HCT: 30.2 % — ABNORMAL LOW (ref 36.0–46.0)
Hemoglobin: 10.1 g/dL — ABNORMAL LOW (ref 12.0–15.0)
Immature Granulocytes: 1 %
Lymphocytes Relative: 11 %
Lymphs Abs: 1 10*3/uL (ref 0.7–4.0)
MCH: 30.3 pg (ref 26.0–34.0)
MCHC: 33.4 g/dL (ref 30.0–36.0)
MCV: 90.7 fL (ref 80.0–100.0)
Monocytes Absolute: 0.6 10*3/uL (ref 0.1–1.0)
Monocytes Relative: 6 %
Neutro Abs: 7.8 10*3/uL — ABNORMAL HIGH (ref 1.7–7.7)
Neutrophils Relative %: 79 %
Platelets: 249 10*3/uL (ref 150–400)
RBC: 3.33 MIL/uL — ABNORMAL LOW (ref 3.87–5.11)
RDW: 14.6 % (ref 11.5–15.5)
WBC: 9.8 10*3/uL (ref 4.0–10.5)
nRBC: 0 % (ref 0.0–0.2)

## 2020-03-18 LAB — FERRITIN: Ferritin: 194 ng/mL (ref 11–307)

## 2020-03-21 NOTE — Progress Notes (Signed)
[place]  [phone] Fax:([phone]) [phone]  ID: Ellene Route OB: 1946-01-25  MR#: [id number]  DTO#:[id number]  Patient Care Team: Tracie Harrier, MD as PCP - General (Internal Medicine)  CHIEF COMPLAINT: Iron deficiency anemia  INTERVAL HISTORY: Patient returns to clinic today for repeat laboratory work, further evaluation, and continuation of IV Venofer.  She complains of occasional dizziness as well as headache, but otherwise feels well.  She continues to have chronic weakness and fatigue.  She has no other neurologic complaints. She denies any recent fevers or illnesses. She has a good appetite and denies weight loss.  She has no chest pain, shortness of breath, cough, or hemoptysis.  She denies any nausea, vomiting, constipation, or diarrhea.  She has no melena or hematochezia.  She has no urinary complaints.  Patient offers no further specific complaints today.  REVIEW OF SYSTEMS:   Review of Systems  Constitutional: Positive for malaise/fatigue. Negative for fever and weight loss.  Respiratory: Negative.  Negative for cough, hemoptysis and shortness of breath.   Cardiovascular: Negative.  Negative for chest pain and leg swelling.  Gastrointestinal: Negative.  Negative for abdominal pain, blood in stool and melena.  Genitourinary: Negative.  Negative for dysuria and hematuria.  Musculoskeletal: Negative.  Negative for back pain.  Skin: Negative.  Negative for rash.  Neurological: Positive for dizziness, weakness and headaches. Negative for focal weakness.  Psychiatric/Behavioral: Negative.  The patient is not nervous/anxious.     As per HPI. Otherwise, a complete review of systems is negative.  PAST MEDICAL HISTORY: Past Medical History:  Diagnosis Date   Acid reflux    Anemia    CAD (coronary artery disease)    Chronic constipation    Chronic kidney disease    stage 3 chronic kidney disease   Eczema    H/O heart artery stent     Hyperlipidemia    Hypertension    IBS (irritable bowel syndrome) 2004   Migraine    Mitral valve prolapse    Peptic ulcer 1989   Skin cancer     PAST SURGICAL HISTORY: Past Surgical History:  Procedure Laterality Date   ABDOMINAL HYSTERECTOMY     APPENDECTOMY     CARDIAC CATHETERIZATION N/A 10/21/2015   Procedure: Left Heart Cath and Coronary Angiography;  Surgeon: Isaias Cowman, MD;  Location: [facility];  Service: Cardiovascular;  Laterality: N/A;   CARDIAC CATHETERIZATION N/A 10/21/2015   Procedure: Coronary Stent Intervention;  Surgeon: Isaias Cowman, MD;  Location: [facility];  Service: Cardiovascular;  Laterality: N/A;   CARDIAC CATHETERIZATION Left 11/24/2015   Procedure: Coronary Stent Intervention;  Surgeon: Isaias Cowman, MD;  Location: [facility];  Service: Cardiovascular;  Laterality: Left;   CARDIAC SURGERY  2007   cardiac stent place   CATARACT EXTRACTION  2012   CHOLECYSTECTOMY     CORONARY ANGIOPLASTY WITH STENT PLACEMENT  2013   CORONARY ARTERY BYPASS GRAFT  2008   CORONARY STENT INTERVENTION N/A 07/04/2017   Procedure: CORONARY STENT INTERVENTION;  Surgeon: Isaias Cowman, MD;  Location: [facility];  Service: Cardiovascular;  Laterality: N/A;   CORONARY STENT INTERVENTION N/A 08/08/2017   Procedure: CORONARY STENT INTERVENTION;  Surgeon: Isaias Cowman, MD;  Location: [facility];  Service: Cardiovascular;  Laterality: N/A;   CYSTOSCOPY W/ RETROGRADES Bilateral 12/29/2014   Procedure: CYSTOSCOPY WITH RETROGRADE PYELOGRAM;  Surgeon: Collier Flowers, MD;  Location: [facility];  Service: Urology;  Laterality: Bilateral;  CYSTOSCOPY WITH BIOPSY N/A 12/29/2014   Procedure: CYSTOSCOPY WITH BIOPSY;  Surgeon: Collier Flowers, MD;  Location: [facility];  Service: Urology;  Laterality: N/A;   HAND SURGERY  1998   LEFT HEART CATH AND CORONARY ANGIOGRAPHY Left 08/08/2017   Procedure:  LEFT HEART CATH AND CORONARY ANGIOGRAPHY;  Surgeon: Isaias Cowman, MD;  Location: [place] [facility];  Service: Cardiovascular;  Laterality: Left;   LEFT HEART CATH AND CORONARY ANGIOGRAPHY Left 08/01/2019   Procedure: LEFT HEART CATH AND CORONARY ANGIOGRAPHY;  Surgeon: Isaias Cowman, MD;  Location: [facility];  Service: Cardiovascular;  Laterality: Left;   LEFT HEART CATH AND CORS/GRAFTS ANGIOGRAPHY N/A 07/04/2017   Procedure: LEFT HEART CATH AND CORS/GRAFTS ANGIOGRAPHY;  Surgeon: Isaias Cowman, MD;  Location: [facility];  Service: Cardiovascular;  Laterality: N/A;   SIGMOID RESECTION / RECTOPEXY  2006   SKIN CANCER EXCISION  2013   TENDON REPAIR     right elbow   TRIGGER FINGER RELEASE      FAMILY HISTORY: Family History  Problem Relation Age of Onset   Kidney Stones Father    Prostate cancer Father    Heart disease Father    Kidney Stones Brother    Heart disease Mother     ADVANCED DIRECTIVES (Y/N):  N  HEALTH MAINTENANCE: Social History   Tobacco Use   Smoking status: Never Smoker   Smokeless tobacco: Never Used  Vaping Use   Vaping Use: Never used  Substance Use Topics   Alcohol use: No   Drug use: No     Colonoscopy:  PAP:  Bone density:  Lipid panel:  Allergies  Allergen Reactions   Prochlorperazine Nausea And Vomiting, Other (See Comments) and Nausea Only    Severe vomiting   Ezetimibe Other (See Comments)    Muscle Pain   Statins Other (See Comments)    Muscle Pain   Tape Other (See Comments)    Pulls my skin off when the tape is removed. Use paper tape only.   Cephalexin Rash   Metoclopramide Rash and Other (See Comments)    Elevates BP, face draws to one side   Penicillins Rash and Other (See Comments)    Has patient had a PCN reaction causing immediate rash, facial/tongue/throat swelling, SOB or lightheadedness with hypotension: Yes Has patient had a PCN reaction causing severe rash  involving mucus membranes or skin necrosis: Yes Has patient had a PCN reaction that required hospitalization No Has patient had a PCN reaction occurring within the last 10 years: No If all of the above answers are "NO", then may proceed with Cephalosporin use.     Current Outpatient Medications  Medication Sig Dispense Refill   acetaminophen (TYLENOL) 325 MG tablet Take 650 mg by mouth every 6 (six) hours as needed (for pain.).     allopurinol (ZYLOPRIM) 100 MG tablet Take 100 mg by mouth daily with lunch.      aspirin 325 MG tablet Take 325 mg by mouth daily with lunch.      AZO-CRANBERRY PO Take 1 capsule by mouth daily with lunch.      CANNABIDIOL PO Take 15 mg by mouth daily. CBD Gummies     clopidogrel (PLAVIX) 75 MG tablet Take 75 mg by mouth daily with lunch.      furosemide (LASIX) 40 MG tablet Take 20 mg by mouth every other day. Every other day with lunch     gabapentin (NEURONTIN) 100 MG capsule Take 100 mg  by mouth at bedtime.      hyoscyamine (LEVSIN SL) 0.125 MG SL tablet Take 0.125 mg by mouth daily.      isosorbide mononitrate (IMDUR) 60 MG 24 hr tablet Take 60 mg by mouth daily.      MAGNESIUM CITRATE PO Take 100 mg by mouth daily.      metoprolol succinate (TOPROL-XL) 25 MG 24 hr tablet Take 25 mg by mouth daily with lunch.      nitroGLYCERIN (NITROSTAT) 0.4 MG SL tablet Place 0.4 mg under the tongue every 5 (five) minutes as needed for chest pain.      pantoprazole (PROTONIX) 40 MG tablet Take 40 mg by mouth daily before breakfast.      potassium chloride (MICRO-K) 10 MEQ CR capsule Take 20 mEq by mouth in the morning and at bedtime.      promethazine (PHENERGAN) 25 MG tablet Take 25 mg by mouth every 8 (eight) hours as needed for nausea or vomiting.      ranolazine (RANEXA) 500 MG 12 hr tablet Take 1 tablet by mouth daily.     riboflavin (VITAMIN B-2) 100 MG TABS tablet Take 100 mg by mouth daily with lunch.      senna (SENOKOT) 8.6 MG tablet Take 4-6  tablets by mouth at bedtime.      sodium bicarbonate 650 MG tablet Take 650 mg by mouth daily with lunch.     vitamin E 400 UNIT capsule Take 400 Units by mouth daily with lunch.      No current facility-administered medications for this visit.    OBJECTIVE: Vitals:   03/24/20 1311  BP: 108/74  Pulse: 74  Resp: 20  Temp: 97.8 F (36.6 C)  SpO2: 100%     Body mass index is 19.41 kg/m.    ECOG FS:0 - Asymptomatic  General: Well-developed, well-nourished, no acute distress. Eyes: Pink conjunctiva, anicteric sclera. HEENT: Normocephalic, moist mucous membranes. Lungs: No audible wheezing or coughing. Heart: Regular rate and rhythm. Abdomen: Soft, nontender, no obvious distention. Musculoskeletal: No edema, cyanosis, or clubbing. Neuro: Alert, answering all questions appropriately. Cranial nerves grossly intact. Skin: No rashes or petechiae noted. Psych: Normal affect.    LAB RESULTS:  Lab Results  Component Value Date   NA 140 08/09/2017   K 3.9 08/09/2017   CL 111 08/09/2017   CO2 23 08/09/2017   GLUCOSE 96 08/09/2017   BUN 23 (H) 08/09/2017   CREATININE 1.52 (H) 08/09/2017   CALCIUM 8.4 (L) 08/09/2017   PROT 7.0 11/30/2006   ALBUMIN 4.4 11/30/2006   AST 19 11/30/2006   ALT 11 11/30/2006   ALKPHOS 51 11/30/2006   BILITOT 0.7 11/30/2006   GFRNONAA 34 (L) 08/09/2017   GFRAA 39 (L) 08/09/2017    Lab Results  Component Value Date   WBC 9.8 03/18/2020   NEUTROABS 7.8 (H) 03/18/2020   HGB 10.1 (L) 03/18/2020   HCT 30.2 (L) 03/18/2020   MCV 90.7 03/18/2020   PLT 249 03/18/2020   Lab Results  Component Value Date   IRON 91 03/18/2020   TIBC 228 (L) 03/18/2020   IRONPCTSAT 40 (H) 03/18/2020   Lab Results  Component Value Date   FERRITIN 194 03/18/2020     STUDIES: No results found.  ASSESSMENT: Iron deficiency anemia  PLAN:    1. Iron deficiency anemia: Patient's hemoglobin remains decreased, but essentially unchanged.  Iron stores are within  normal limits. Given her significant cardiac disease, will continue to monitor closely and try to  maintain hemoglobin greater than 10.0.  She does not require any treatment today.  Also given her underlying chronic renal insufficiency, patient may benefit from Retacrit in the future.  Return to clinic in 3 months with repeat laboratory work, further evaluation, and continuation of treatment if needed. 2.  Cardiac disease: Continue follow-up with cardiology as scheduled.   3.  Chronic renal insufficiency: Chronic and unchanged continue follow-up with nephrology as scheduled. 4.  Dizziness: Patient has been instructed to call her cardiologist for further evaluation.  Patient expressed understanding and was in agreement with this plan. She also understands that She can call clinic at any time with any questions, concerns, or complaints.    Lloyd Huger, MD   03/27/2020 6:26 AM

## 2020-03-23 ENCOUNTER — Other Ambulatory Visit: Payer: Medicare HMO

## 2020-03-24 ENCOUNTER — Other Ambulatory Visit: Payer: Self-pay

## 2020-03-24 ENCOUNTER — Encounter: Payer: Self-pay | Admitting: Oncology

## 2020-03-24 ENCOUNTER — Inpatient Hospital Stay: Payer: Medicare HMO | Admitting: Oncology

## 2020-03-24 ENCOUNTER — Inpatient Hospital Stay: Payer: Medicare HMO

## 2020-03-24 VITALS — BP 108/74 | HR 74 | Temp 97.8°F | Resp 20 | Wt 99.4 lb

## 2020-03-24 DIAGNOSIS — D509 Iron deficiency anemia, unspecified: Secondary | ICD-10-CM | POA: Diagnosis not present

## 2020-03-24 DIAGNOSIS — I129 Hypertensive chronic kidney disease with stage 1 through stage 4 chronic kidney disease, or unspecified chronic kidney disease: Secondary | ICD-10-CM | POA: Diagnosis not present

## 2020-03-24 NOTE — Progress Notes (Signed)
Patient states she feels "Drunk". Patient states she is dizzy, and light headed. Patient also states she is having some nausea. Patient states she is having frequent headaches. Patient states she is dizzy and light headed when she is sitting down. She gets these spells at any moment.Patient states Last Wednesday she almost fainted in the local grocery store and once checked out she had to wait a few minutes before she go drive away. Patient states she has a history of faint and blacking out.

## 2020-07-02 ENCOUNTER — Inpatient Hospital Stay: Payer: Medicare HMO | Attending: Oncology

## 2020-07-02 DIAGNOSIS — N183 Chronic kidney disease, stage 3 unspecified: Secondary | ICD-10-CM | POA: Insufficient documentation

## 2020-07-02 DIAGNOSIS — R5383 Other fatigue: Secondary | ICD-10-CM | POA: Diagnosis not present

## 2020-07-02 DIAGNOSIS — R42 Dizziness and giddiness: Secondary | ICD-10-CM | POA: Diagnosis not present

## 2020-07-02 DIAGNOSIS — R519 Headache, unspecified: Secondary | ICD-10-CM | POA: Insufficient documentation

## 2020-07-02 DIAGNOSIS — I129 Hypertensive chronic kidney disease with stage 1 through stage 4 chronic kidney disease, or unspecified chronic kidney disease: Secondary | ICD-10-CM | POA: Diagnosis present

## 2020-07-02 DIAGNOSIS — R531 Weakness: Secondary | ICD-10-CM | POA: Insufficient documentation

## 2020-07-02 DIAGNOSIS — D509 Iron deficiency anemia, unspecified: Secondary | ICD-10-CM | POA: Diagnosis present

## 2020-07-02 LAB — CBC WITH DIFFERENTIAL/PLATELET
Abs Immature Granulocytes: 0.05 10*3/uL (ref 0.00–0.07)
Basophils Absolute: 0.1 10*3/uL (ref 0.0–0.1)
Basophils Relative: 2 %
Eosinophils Absolute: 1.1 10*3/uL — ABNORMAL HIGH (ref 0.0–0.5)
Eosinophils Relative: 15 %
HCT: 33.6 % — ABNORMAL LOW (ref 36.0–46.0)
Hemoglobin: 11.1 g/dL — ABNORMAL LOW (ref 12.0–15.0)
Immature Granulocytes: 1 %
Lymphocytes Relative: 21 %
Lymphs Abs: 1.5 10*3/uL (ref 0.7–4.0)
MCH: 30.6 pg (ref 26.0–34.0)
MCHC: 33 g/dL (ref 30.0–36.0)
MCV: 92.6 fL (ref 80.0–100.0)
Monocytes Absolute: 0.6 10*3/uL (ref 0.1–1.0)
Monocytes Relative: 9 %
Neutro Abs: 3.7 10*3/uL (ref 1.7–7.7)
Neutrophils Relative %: 52 %
Platelets: 265 10*3/uL (ref 150–400)
RBC: 3.63 MIL/uL — ABNORMAL LOW (ref 3.87–5.11)
RDW: 14 % (ref 11.5–15.5)
WBC: 7.1 10*3/uL (ref 4.0–10.5)
nRBC: 0 % (ref 0.0–0.2)

## 2020-07-02 LAB — IRON AND TIBC
Iron: 105 ug/dL (ref 28–170)
Saturation Ratios: 43 % — ABNORMAL HIGH (ref 10.4–31.8)
TIBC: 246 ug/dL — ABNORMAL LOW (ref 250–450)
UIBC: 141 ug/dL

## 2020-07-02 LAB — FERRITIN: Ferritin: 135 ng/mL (ref 11–307)

## 2020-07-04 NOTE — Progress Notes (Signed)
[place]  [phone] Fax:(336) [phone]  ID: Ellene Route OB: 1946/04/22  MR#: [id number]  NIO#:[id number]  Patient Care Team: Tracie Harrier, MD as PCP - General (Internal Medicine)  CHIEF COMPLAINT: Iron deficiency anemia  INTERVAL HISTORY: Patient returns to clinic today for repeat laboratory work and further evaluation.  She currently feels well.  She does not complain of any weakness or fatigue.  She does not complain of any further dizziness either.  She has no neurologic complaints. She denies any recent fevers or illnesses. She has a good appetite and denies weight loss.  She has no chest pain, shortness of breath, cough, or hemoptysis.  She denies any nausea, vomiting, constipation, or diarrhea.  She has no melena or hematochezia.  She has no urinary complaints.  Patient offers no specific complaints today.  REVIEW OF SYSTEMS:   Review of Systems  Constitutional: Negative.  Negative for fever, malaise/fatigue and weight loss.  Respiratory: Negative.  Negative for cough, hemoptysis and shortness of breath.   Cardiovascular: Negative.  Negative for chest pain and leg swelling.  Gastrointestinal: Negative.  Negative for abdominal pain, blood in stool and melena.  Genitourinary: Negative.  Negative for dysuria and hematuria.  Musculoskeletal: Negative.  Negative for back pain.  Skin: Negative.  Negative for rash.  Neurological: Negative.  Negative for dizziness, focal weakness, weakness and headaches.  Psychiatric/Behavioral: Negative.  The patient is not nervous/anxious.     As per HPI. Otherwise, a complete review of systems is negative.  PAST MEDICAL HISTORY: Past Medical History:  Diagnosis Date  . Acid reflux   . Anemia   . CAD (coronary artery disease)   . Chronic constipation   . Chronic kidney disease    stage 3 chronic kidney disease  . Eczema   . H/O heart artery stent   . Hyperlipidemia   . Hypertension   . IBS  (irritable bowel syndrome) 2004  . Migraine   . Mitral valve prolapse   . Peptic ulcer 1989  . Skin cancer     PAST SURGICAL HISTORY: Past Surgical History:  Procedure Laterality Date  . ABDOMINAL HYSTERECTOMY    . APPENDECTOMY    . CARDIAC CATHETERIZATION N/A 10/21/2015   Procedure: Left Heart Cath and Coronary Angiography;  Surgeon: Isaias Cowman, MD;  Location: [facility];  Service: Cardiovascular;  Laterality: N/A;  . CARDIAC CATHETERIZATION N/A 10/21/2015   Procedure: Coronary Stent Intervention;  Surgeon: Isaias Cowman, MD;  Location: [facility];  Service: Cardiovascular;  Laterality: N/A;  . CARDIAC CATHETERIZATION Left 11/24/2015   Procedure: Coronary Stent Intervention;  Surgeon: Isaias Cowman, MD;  Location: [facility];  Service: Cardiovascular;  Laterality: Left;  . CARDIAC SURGERY  2007   cardiac stent place  . CATARACT EXTRACTION  2012  . CHOLECYSTECTOMY    . CORONARY ANGIOPLASTY WITH STENT PLACEMENT  2013  . CORONARY ARTERY BYPASS GRAFT  2008  . CORONARY STENT INTERVENTION N/A 07/04/2017   Procedure: CORONARY STENT INTERVENTION;  Surgeon: Isaias Cowman, MD;  Location: [facility];  Service: Cardiovascular;  Laterality: N/A;  . CORONARY STENT INTERVENTION N/A 08/08/2017   Procedure: CORONARY STENT INTERVENTION;  Surgeon: Isaias Cowman, MD;  Location: [facility];  Service: Cardiovascular;  Laterality: N/A;  . CYSTOSCOPY W/ RETROGRADES Bilateral 12/29/2014   Procedure: CYSTOSCOPY WITH RETROGRADE PYELOGRAM;  Surgeon: Collier Flowers, MD;  Location: [facility];  Service: Urology;  Laterality: Bilateral;  . CYSTOSCOPY WITH BIOPSY N/A  12/29/2014   Procedure: CYSTOSCOPY WITH BIOPSY;  Surgeon: Collier Flowers, MD;  Location: [facility];  Service: Urology;  Laterality: N/A;  . HAND SURGERY  1998  . LEFT HEART CATH AND CORONARY ANGIOGRAPHY Left 08/08/2017   Procedure: LEFT HEART CATH AND CORONARY ANGIOGRAPHY;   Surgeon: Isaias Cowman, MD;  Location: [facility];  Service: Cardiovascular;  Laterality: Left;  . LEFT HEART CATH AND CORONARY ANGIOGRAPHY Left 08/01/2019   Procedure: LEFT HEART CATH AND CORONARY ANGIOGRAPHY;  Surgeon: Isaias Cowman, MD;  Location: [place];  Service: Cardiovascular;  Laterality: Left;  . LEFT HEART CATH AND CORS/GRAFTS ANGIOGRAPHY N/A 07/04/2017   Procedure: LEFT HEART CATH AND CORS/GRAFTS ANGIOGRAPHY;  Surgeon: Isaias Cowman, MD;  Location: [facility];  Service: Cardiovascular;  Laterality: N/A;  . SIGMOID RESECTION / RECTOPEXY  2006  . SKIN CANCER EXCISION  2013  . TENDON REPAIR     right elbow  . TRIGGER FINGER RELEASE      FAMILY HISTORY: Family History  Problem Relation Age of Onset  . Kidney Stones Father   . Prostate cancer Father   . Heart disease Father   . Kidney Stones Brother   . Heart disease Mother     ADVANCED DIRECTIVES (Y/N):  N  HEALTH MAINTENANCE: Social History   Tobacco Use  . Smoking status: Never Smoker  . Smokeless tobacco: Never Used  Vaping Use  . Vaping Use: Never used  Substance Use Topics  . Alcohol use: No  . Drug use: No     Colonoscopy:  PAP:  Bone density:  Lipid panel:  Allergies  Allergen Reactions  . Prochlorperazine Nausea And Vomiting, Other (See Comments) and Nausea Only    Severe vomiting  . Ezetimibe Other (See Comments)    Muscle Pain  . Statins Other (See Comments)    Muscle Pain  . Tape Other (See Comments)    Pulls my skin off when the tape is removed. Use paper tape only.  . Cephalexin Rash  . Metoclopramide Rash and Other (See Comments)    Elevates BP, face draws to one side  . Penicillins Rash and Other (See Comments)    Has patient had a PCN reaction causing immediate rash, facial/tongue/throat swelling, SOB or lightheadedness with hypotension: Yes Has patient had a PCN reaction causing severe rash involving mucus membranes or skin necrosis:  Yes Has patient had a PCN reaction that required hospitalization No Has patient had a PCN reaction occurring within the last 10 years: No If all of the above answers are "NO", then may proceed with Cephalosporin use.     Current Outpatient Medications  Medication Sig Dispense Refill  . acetaminophen (TYLENOL) 325 MG tablet Take 650 mg by mouth every 6 (six) hours as needed (for pain.).    Marland Kitchen allopurinol (ZYLOPRIM) 100 MG tablet Take 100 mg by mouth daily with lunch.     Marland Kitchen aspirin 325 MG tablet Take 325 mg by mouth daily with lunch.     . AZO-CRANBERRY PO Take 1 capsule by mouth daily with lunch.     . CANNABIDIOL PO Take 15 mg by mouth daily. CBD Gummies    . clopidogrel (PLAVIX) 75 MG tablet Take 75 mg by mouth daily with lunch.     . furosemide (LASIX) 40 MG tablet Take 20 mg by mouth once a week. Every other day with lunch    . gabapentin (NEURONTIN) 100 MG capsule Take 100 mg by mouth at bedtime.     Marland Kitchen  hyoscyamine (LEVSIN SL) 0.125 MG SL tablet Take 0.125 mg by mouth daily.     . isosorbide mononitrate (IMDUR) 60 MG 24 hr tablet Take 60 mg by mouth daily.     Marland Kitchen MAGNESIUM CITRATE PO Take 100 mg by mouth daily.     . metoprolol succinate (TOPROL-XL) 25 MG 24 hr tablet Take 25 mg by mouth daily with lunch.     . nitroGLYCERIN (NITROSTAT) 0.4 MG SL tablet Place 0.4 mg under the tongue every 5 (five) minutes as needed for chest pain.     . pantoprazole (PROTONIX) 40 MG tablet Take 40 mg by mouth daily before breakfast.     . potassium chloride (MICRO-K) 10 MEQ CR capsule Take 20 mEq by mouth in the morning and at bedtime.     . promethazine (PHENERGAN) 25 MG tablet Take 25 mg by mouth every 8 (eight) hours as needed for nausea or vomiting.     . ranolazine (RANEXA) 500 MG 12 hr tablet Take 1 tablet by mouth daily.    . riboflavin (VITAMIN B-2) 100 MG TABS tablet Take 100 mg by mouth daily with lunch.     . senna (SENOKOT) 8.6 MG tablet Take 4-6 tablets by mouth at bedtime.     . sodium  bicarbonate 650 MG tablet Take 650 mg by mouth daily with lunch.    . vitamin E 400 UNIT capsule Take 400 Units by mouth daily with lunch.      No current facility-administered medications for this visit.    OBJECTIVE: Vitals:   07/07/20 1312  BP: (!) 100/59  Pulse: 82  Temp: 98 F (36.7 C)  SpO2: 100%     Body mass index is 20.35 kg/m.    ECOG FS:0 - Asymptomatic  General: Well-developed, well-nourished, no acute distress. Eyes: Pink conjunctiva, anicteric sclera. HEENT: Normocephalic, moist mucous membranes. Lungs: No audible wheezing or coughing. Heart: Regular rate and rhythm. Abdomen: Soft, nontender, no obvious distention. Musculoskeletal: No edema, cyanosis, or clubbing. Neuro: Alert, answering all questions appropriately. Cranial nerves grossly intact. Skin: No rashes or petechiae noted. Psych: Normal affect.  LAB RESULTS:  Lab Results  Component Value Date   NA 140 08/09/2017   K 3.9 08/09/2017   CL 111 08/09/2017   CO2 23 08/09/2017   GLUCOSE 96 08/09/2017   BUN 23 (H) 08/09/2017   CREATININE 1.52 (H) 08/09/2017   CALCIUM 8.4 (L) 08/09/2017   PROT 7.0 11/30/2006   ALBUMIN 4.4 11/30/2006   AST 19 11/30/2006   ALT 11 11/30/2006   ALKPHOS 51 11/30/2006   BILITOT 0.7 11/30/2006   GFRNONAA 34 (L) 08/09/2017   GFRAA 39 (L) 08/09/2017    Lab Results  Component Value Date   WBC 7.1 07/02/2020   NEUTROABS 3.7 07/02/2020   HGB 11.1 (L) 07/02/2020   HCT 33.6 (L) 07/02/2020   MCV 92.6 07/02/2020   PLT 265 07/02/2020   Lab Results  Component Value Date   IRON 105 07/02/2020   TIBC 246 (L) 07/02/2020   IRONPCTSAT 43 (H) 07/02/2020   Lab Results  Component Value Date   FERRITIN 135 07/02/2020     STUDIES: No results found.  ASSESSMENT: Iron deficiency anemia  PLAN:    1. Iron deficiency anemia: Patient's hemoglobin remains decreased, but mildly improved from previous.  Iron stores continue to be within normal limits. Given her significant  cardiac disease, will continue to monitor closely and try to maintain hemoglobin greater than 10.0.  She does not require  treatment today.  Patient last received 200 mg IV Venofer on August 22, 2019.  Return to clinic in 4 months with repeat laboratory work and further evaluation. 2.  Chronic renal insufficiency: Chronic and unchanged.  Given patient's hemoglobin is greater than 10.0, she does not require Retacrit today.  Continue follow-up with nephrology as scheduled. 3.  Cardiac disease: Continue follow-up with cardiology as scheduled.   4.  Dizziness: Patient does not complain of this today.  Patient expressed understanding and was in agreement with this plan. She also understands that She can call clinic at any time with any questions, concerns, or complaints.    Lloyd Huger, MD   07/08/2020 2:23 PM

## 2020-07-07 ENCOUNTER — Inpatient Hospital Stay: Payer: Medicare HMO | Attending: Oncology | Admitting: Oncology

## 2020-07-07 ENCOUNTER — Encounter: Payer: Self-pay | Admitting: Oncology

## 2020-07-07 ENCOUNTER — Ambulatory Visit: Payer: Medicare HMO

## 2020-07-07 VITALS — BP 100/59 | HR 82 | Temp 98.0°F | Wt 104.2 lb

## 2020-07-07 DIAGNOSIS — Z79899 Other long term (current) drug therapy: Secondary | ICD-10-CM | POA: Insufficient documentation

## 2020-07-07 DIAGNOSIS — N183 Chronic kidney disease, stage 3 unspecified: Secondary | ICD-10-CM | POA: Insufficient documentation

## 2020-07-07 DIAGNOSIS — D509 Iron deficiency anemia, unspecified: Secondary | ICD-10-CM

## 2020-07-07 NOTE — Progress Notes (Signed)
Patient denies any concerns today.  

## 2020-09-20 ENCOUNTER — Emergency Department
Admission: EM | Admit: 2020-09-20 | Discharge: 2020-09-20 | Disposition: A | Discharge status: Home / Self Care | Payer: Medicare HMO | Attending: Emergency Medicine | Admitting: Emergency Medicine

## 2020-09-20 ENCOUNTER — Emergency Department: Payer: Medicare HMO

## 2020-09-20 ENCOUNTER — Encounter: Payer: Self-pay | Admitting: Emergency Medicine

## 2020-09-20 ENCOUNTER — Other Ambulatory Visit: Payer: Self-pay

## 2020-09-20 DIAGNOSIS — Z79899 Other long term (current) drug therapy: Secondary | ICD-10-CM | POA: Diagnosis not present

## 2020-09-20 DIAGNOSIS — I959 Hypotension, unspecified: Secondary | ICD-10-CM | POA: Insufficient documentation

## 2020-09-20 DIAGNOSIS — I1310 Hypertensive heart and chronic kidney disease without heart failure, with stage 1 through stage 4 chronic kidney disease, or unspecified chronic kidney disease: Secondary | ICD-10-CM | POA: Insufficient documentation

## 2020-09-20 DIAGNOSIS — I2510 Atherosclerotic heart disease of native coronary artery without angina pectoris: Secondary | ICD-10-CM | POA: Diagnosis not present

## 2020-09-20 DIAGNOSIS — R55 Syncope and collapse: Secondary | ICD-10-CM | POA: Diagnosis present

## 2020-09-20 DIAGNOSIS — N183 Chronic kidney disease, stage 3 unspecified: Secondary | ICD-10-CM | POA: Diagnosis not present

## 2020-09-20 DIAGNOSIS — Z7982 Long term (current) use of aspirin: Secondary | ICD-10-CM | POA: Insufficient documentation

## 2020-09-20 DIAGNOSIS — Z951 Presence of aortocoronary bypass graft: Secondary | ICD-10-CM | POA: Insufficient documentation

## 2020-09-20 DIAGNOSIS — E872 Acidosis, unspecified: Secondary | ICD-10-CM

## 2020-09-20 LAB — CBC WITH DIFFERENTIAL/PLATELET
Abs Immature Granulocytes: 0.04 10*3/uL (ref 0.00–0.07)
Basophils Absolute: 0.1 10*3/uL (ref 0.0–0.1)
Basophils Relative: 2 %
Eosinophils Absolute: 0.4 10*3/uL (ref 0.0–0.5)
Eosinophils Relative: 6 %
HCT: 30.3 % — ABNORMAL LOW (ref 36.0–46.0)
Hemoglobin: 10 g/dL — ABNORMAL LOW (ref 12.0–15.0)
Immature Granulocytes: 1 %
Lymphocytes Relative: 20 %
Lymphs Abs: 1.2 10*3/uL (ref 0.7–4.0)
MCH: 30.7 pg (ref 26.0–34.0)
MCHC: 33 g/dL (ref 30.0–36.0)
MCV: 92.9 fL (ref 80.0–100.0)
Monocytes Absolute: 0.6 10*3/uL (ref 0.1–1.0)
Monocytes Relative: 9 %
Neutro Abs: 3.8 10*3/uL (ref 1.7–7.7)
Neutrophils Relative %: 62 %
Platelets: 229 10*3/uL (ref 150–400)
RBC: 3.26 MIL/uL — ABNORMAL LOW (ref 3.87–5.11)
RDW: 14.4 % (ref 11.5–15.5)
WBC: 6 10*3/uL (ref 4.0–10.5)
nRBC: 0 % (ref 0.0–0.2)

## 2020-09-20 LAB — COMPREHENSIVE METABOLIC PANEL
ALT: 8 U/L (ref 0–44)
AST: 16 U/L (ref 15–41)
Albumin: 3.6 g/dL (ref 3.5–5.0)
Alkaline Phosphatase: 63 U/L (ref 38–126)
Anion gap: 8 (ref 5–15)
BUN: 33 mg/dL — ABNORMAL HIGH (ref 8–23)
CO2: 18 mmol/L — ABNORMAL LOW (ref 22–32)
Calcium: 8.3 mg/dL — ABNORMAL LOW (ref 8.9–10.3)
Chloride: 112 mmol/L — ABNORMAL HIGH (ref 98–111)
Creatinine, Ser: 2.93 mg/dL — ABNORMAL HIGH (ref 0.44–1.00)
GFR, Estimated: 16 mL/min — ABNORMAL LOW (ref >60)
Glucose, Bld: 93 mg/dL (ref 70–99)
Potassium: 3.6 mmol/L (ref 3.5–5.1)
Sodium: 138 mmol/L (ref 135–145)
Total Bilirubin: 0.6 mg/dL (ref 0.3–1.2)
Total Protein: 6.1 g/dL — ABNORMAL LOW (ref 6.5–8.1)

## 2020-09-20 LAB — BRAIN NATRIURETIC PEPTIDE: B Natriuretic Peptide: 46.1 pg/mL (ref 0.0–100.0)

## 2020-09-20 LAB — PROCALCITONIN: Procalcitonin: 0.1 ng/mL

## 2020-09-20 LAB — TROPONIN I (HIGH SENSITIVITY): Troponin I (High Sensitivity): 3 ng/L (ref <18)

## 2020-09-20 LAB — LACTIC ACID, PLASMA: Lactic Acid, Venous: 2.4 mmol/L (ref 0.5–1.9)

## 2020-09-20 MED ORDER — LACTATED RINGERS IV SOLN: INTRAVENOUS | Status: DC

## 2020-09-20 MED ORDER — LACTATED RINGERS IV BOLUS
500.0000 mL | Freq: Once | INTRAVENOUS | Status: AC
  Administered 2020-09-20: 500 mL via INTRAVENOUS

## 2020-09-20 MED ORDER — LACTATED RINGERS IV BOLUS: 1000.0000 mL | Freq: Once | INTRAVENOUS | Status: DC

## 2020-09-20 NOTE — ED Notes (Signed)
Reference previous RN noted. Pt leaving at this time AMA. Pt in NAD at time of departure and ambulatory with significant other to parking lot at this time. Pt instructed to return anytime if symptoms worsen or she changes her mind. Pt verbalized understanding.

## 2020-09-20 NOTE — ED Notes (Signed)
Pt's BP read low. She is alert and oriented but complains of feeling " a little woozy." 500 ml LR bolus started per order.

## 2020-09-20 NOTE — ED Triage Notes (Signed)
[facility]. Ems recorded bp 59/34 and 50/26. Pt received 500cc bolus by EMS. Pt bp 119/90 here. Per pt she has history of same - last time august 2021

## 2020-09-20 NOTE — ED Provider Notes (Signed)
[facility] Emergency Department Provider Note  ____________________________________________   Event Date/Time   First MD Initiated Contact with Patient 09/20/20 1032     (approximate)  I have reviewed the triage vital signs and the nursing notes.   HISTORY  Chief Complaint Loss of Consciousness   HPI [name] is a 75 y.o. female with a past medical history of CAD status post triple bypass, CKD, HTN, HDL, mitral prolapse, anemia, carotid artery stenosis and peptic ulcer disease who presents from church via EMS where she had a syncopal episode.  Patient states she has had multiple syncopal episodes last several years and undergone extensive work-ups have not been able to figure out why she keeps passing out.  She states that she felt little lightheaded going to church today and that while sitting down she started to feel dizzy and her vision went out.  She is not sure how long she was passed out but was sitting and slumped over and was told she did not fall or hit her head.  She states he currently feels back to baseline.  She states the last time this happened was in November.  She states that she has not had any other associated sick symptoms including headache, earache, sore throat, nausea, vomiting, diarrhea, dysuria, rash, chest pain, cough, shortness of breath, abdominal pain or recent falls or injuries.  She does states she is on metoprolol and last took this at lunchtime yesterday as well as Imdur, Lasix         Past Medical History:  Diagnosis Date  . Acid reflux   . Anemia   . CAD (coronary artery disease)   . Chronic constipation   . Chronic kidney disease    stage 3 chronic kidney disease  . Eczema   . H/O heart artery stent   . Hyperlipidemia   . Hypertension   . IBS (irritable bowel syndrome) 2004  . Migraine   . Mitral valve prolapse   . Peptic ulcer 1989  . Skin cancer     Patient Active Problem List   Diagnosis Date Noted   . Hypokalemia 12/09/2019  . Carotid artery stenosis 07/05/2019  . Palpitations 06/24/2019  . Dyspnea on exertion 06/24/2019  . Hyperparathyroidism due to renal insufficiency ([place]) 03/10/2019  . Renal artery stenosis ([place]) 03/10/2019  . Cardiac syncope 02/16/2016  . Unstable angina ([place]) 11/24/2015  . CAD (coronary artery disease) 10/21/2015  . S/P cardiac catheterization 10/21/2015  . Iron deficiency anemia 09/14/2015  . Chest pain 08/20/2015  . Hematuria 12/17/2014  . Allergic condition 11/04/2014  . Mitral valve prolapse 11/04/2014  . Peptic ulcer 11/04/2014  . Gross hematuria 11/04/2014  . Atrophic vaginitis 11/04/2014  . Hyperlipidemia 12/11/2013  . Intolerance of drug 12/11/2013  . Regurgitation 12/11/2013  . Coronary atherosclerosis of autologous vein bypass graft 12/11/2013  . Hypertension 12/11/2013  . Presence of coronary angioplasty implant and graft 12/11/2013  . Statin intolerance 12/11/2013  . Other specified health status 12/11/2013  . Anemia 09/16/2013  . Chronic urinary tract infection 09/16/2013  . Irritable bowel syndrome (IBS) 09/16/2013  . Echocardiogram abnormal 08/02/2007  . Arteriosclerosis of coronary artery 09/28/2005  . Atherosclerotic heart disease of native coronary artery without angina pectoris 09/28/2005  . Gastro-esophageal reflux disease without esophagitis 06/30/2003    Past Surgical History:  Procedure Laterality Date  . ABDOMINAL HYSTERECTOMY    . APPENDECTOMY    . CARDIAC CATHETERIZATION N/A 10/21/2015   Procedure: Left Heart Cath and  Coronary Angiography;  Surgeon: Isaias Cowman, MD;  Location: [place] [facility];  Service: Cardiovascular;  Laterality: N/A;  . CARDIAC CATHETERIZATION N/A 10/21/2015   Procedure: Coronary Stent Intervention;  Surgeon: Isaias Cowman, MD;  Location: [place] [facility];  Service: Cardiovascular;  Laterality: N/A;  . CARDIAC CATHETERIZATION Left 11/24/2015   Procedure: Coronary Stent  Intervention;  Surgeon: Isaias Cowman, MD;  Location: [place] [facility];  Service: Cardiovascular;  Laterality: Left;  . CARDIAC SURGERY  2007   cardiac stent place  . CATARACT EXTRACTION  2012  . CHOLECYSTECTOMY    . CORONARY ANGIOPLASTY WITH STENT PLACEMENT  2013  . CORONARY ARTERY BYPASS GRAFT  2008  . CORONARY STENT INTERVENTION N/A 07/04/2017   Procedure: CORONARY STENT INTERVENTION;  Surgeon: Isaias Cowman, MD;  Location: [place] [facility];  Service: Cardiovascular;  Laterality: N/A;  . CORONARY STENT INTERVENTION N/A 08/08/2017   Procedure: CORONARY STENT INTERVENTION;  Surgeon: Isaias Cowman, MD;  Location: [place] [facility];  Service: Cardiovascular;  Laterality: N/A;  . CYSTOSCOPY W/ RETROGRADES Bilateral 12/29/2014   Procedure: CYSTOSCOPY WITH RETROGRADE PYELOGRAM;  Surgeon: Collier Flowers, MD;  Location: [facility];  Service: Urology;  Laterality: Bilateral;  . CYSTOSCOPY WITH BIOPSY N/A 12/29/2014   Procedure: CYSTOSCOPY WITH BIOPSY;  Surgeon: Collier Flowers, MD;  Location: [facility];  Service: Urology;  Laterality: N/A;  . HAND SURGERY  1998  . LEFT HEART CATH AND CORONARY ANGIOGRAPHY Left 08/08/2017   Procedure: LEFT HEART CATH AND CORONARY ANGIOGRAPHY;  Surgeon: Isaias Cowman, MD;  Location: [place] [facility];  Service: Cardiovascular;  Laterality: Left;  . LEFT HEART CATH AND CORONARY ANGIOGRAPHY Left 08/01/2019   Procedure: LEFT HEART CATH AND CORONARY ANGIOGRAPHY;  Surgeon: Isaias Cowman, MD;  Location: [place] [facility];  Service: Cardiovascular;  Laterality: Left;  . LEFT HEART CATH AND CORS/GRAFTS ANGIOGRAPHY N/A 07/04/2017   Procedure: LEFT HEART CATH AND CORS/GRAFTS ANGIOGRAPHY;  Surgeon: Isaias Cowman, MD;  Location: [place] [facility];  Service: Cardiovascular;  Laterality: N/A;  . SIGMOID RESECTION / RECTOPEXY  2006  . SKIN CANCER EXCISION  2013  . TENDON REPAIR     right elbow  . TRIGGER FINGER RELEASE      Prior  to Admission medications   Medication Sig Start Date End Date Taking? Authorizing Provider  acetaminophen (TYLENOL) 325 MG tablet Take 650 mg by mouth every 6 (six) hours as needed (for pain.).    [provider]  allopurinol (ZYLOPRIM) 100 MG tablet Take 100 mg by mouth daily with lunch.     [provider]  aspirin 325 MG tablet Take 325 mg by mouth daily with lunch.  04/07/11   [provider]  AZO-CRANBERRY PO Take 1 capsule by mouth daily with lunch.     [provider]  CANNABIDIOL PO Take 15 mg by mouth daily. CBD Gummies    [provider]  clopidogrel (PLAVIX) 75 MG tablet Take 75 mg by mouth daily with lunch.  08/11/14   [provider]  furosemide (LASIX) 40 MG tablet Take 20 mg by mouth once a week. Every other day with lunch    [provider]  gabapentin (NEURONTIN) 100 MG capsule Take 100 mg by mouth at bedtime.  11/12/18   [provider]  hyoscyamine (LEVSIN SL) 0.125 MG SL tablet Take 0.125 mg by mouth daily.  04/07/11   [provider]  isosorbide mononitrate (IMDUR) 60 MG 24 hr tablet Take 60  mg by mouth daily.  08/11/14   [provider]  MAGNESIUM CITRATE PO Take 100 mg by mouth daily.     [provider]  metoprolol succinate (TOPROL-XL) 25 MG 24 hr tablet Take 25 mg by mouth daily with lunch.  04/07/11   [provider]  nitroGLYCERIN (NITROSTAT) 0.4 MG SL tablet Place 0.4 mg under the tongue every 5 (five) minutes as needed for chest pain.     [provider]  pantoprazole (PROTONIX) 40 MG tablet Take 40 mg by mouth daily before breakfast.     [provider]  potassium chloride (MICRO-K) 10 MEQ CR capsule Take 20 mEq by mouth in the morning and at bedtime.  07/17/14   [provider]  promethazine (PHENERGAN) 25 MG tablet Take 25 mg by mouth every 8 (eight) hours as needed for nausea or vomiting.  06/14/11   [provider]  ranolazine  (RANEXA) 500 MG 12 hr tablet Take 1 tablet by mouth daily. 08/08/19 08/07/20  [provider]  riboflavin (VITAMIN B-2) 100 MG TABS tablet Take 100 mg by mouth daily with lunch.     [provider]  senna (SENOKOT) 8.6 MG tablet Take 4-6 tablets by mouth at bedtime.     [provider]  sodium bicarbonate 650 MG tablet Take 650 mg by mouth daily with lunch.    [provider]  vitamin E 400 UNIT capsule Take 400 Units by mouth daily with lunch.     [provider]    Allergies Prochlorperazine, Ezetimibe, Statins, Tape, Cephalexin, Metoclopramide, and Penicillins  Family History  Problem Relation Age of Onset  . Kidney Stones Father   . Prostate cancer Father   . Heart disease Father   . Kidney Stones Brother   . Heart disease Mother     Social History Social History   Tobacco Use  . Smoking status: Never Smoker  . Smokeless tobacco: Never Used  Vaping Use  . Vaping Use: Never used  Substance Use Topics  . Alcohol use: No  . Drug use: No    Review of Systems  Review of Systems  Constitutional: Negative for chills and fever.  HENT: Negative for sore throat.   Eyes: Negative for pain.  Respiratory: Negative for cough and stridor.   Cardiovascular: Negative for chest pain.  Gastrointestinal: Negative for vomiting.  Genitourinary: Negative for dysuria.  Musculoskeletal: Negative for myalgias.  Skin: Negative for rash.  Neurological: Positive for dizziness and loss of consciousness. Negative for seizures and headaches.  Psychiatric/Behavioral: Negative for suicidal ideas.  All other systems reviewed and are negative.     ____________________________________________   PHYSICAL EXAM:  VITAL SIGNS: ED Triage Vitals  Enc Vitals Group     BP      Pulse      Resp      Temp      Temp src      SpO2      Weight      Height      Head Circumference      Peak Flow      Pain Score      Pain Loc      Pain Edu?      Excl. in  [place]?    Vitals:   09/20/20 1230 09/20/20 1300  BP: 92/61 (!) 88/55  Pulse: 71 69  Resp: 14 14  Temp:    SpO2: 100% 100%   Physical Exam Vitals and nursing note reviewed.  Constitutional:      General: She is not in acute distress.    Appearance: She is well-developed.  HENT:     Head: Normocephalic and atraumatic.     Right Ear: External ear normal.     Left Ear: External ear normal.     Nose: Nose normal.  Eyes:     Conjunctiva/sclera: Conjunctivae normal.  Cardiovascular:     Rate and Rhythm: Normal rate and regular rhythm.     Heart sounds: No murmur heard.   Pulmonary:     Effort: Pulmonary effort is normal. No respiratory distress.     Breath sounds: Normal breath sounds.  Abdominal:     Palpations: Abdomen is soft.     Tenderness: There is no abdominal tenderness.  Musculoskeletal:     Cervical back: Neck supple.  Skin:    General: Skin is warm and dry.     Capillary Refill: Capillary refill takes less than 2 seconds.  Neurological:     Mental Status: She is alert and oriented to person, place, and time.  Psychiatric:        Mood and Affect: Mood normal.     Cranial nerves II through XII grossly intact.  No pronator drift.  No finger dysmetria.  Symmetric 5/5 strength of all extremities.  Sensation intact to light touch in all extremities.  Unremarkable unassisted gait.  ____________________________________________   LABS (all labs ordered are listed, but only abnormal results are displayed)  Labs Reviewed  CBC WITH DIFFERENTIAL/PLATELET - Abnormal; Notable for the following components:      Result Value   RBC 3.26 (*)    Hemoglobin 10.0 (*)    HCT 30.3 (*)    All other components within normal limits  COMPREHENSIVE METABOLIC PANEL - Abnormal; Notable for the following components:   Chloride 112 (*)    CO2 18 (*)    BUN 33 (*)    Creatinine, Ser 2.93 (*)    Calcium 8.3 (*)    Total Protein 6.1 (*)    GFR, Estimated 16 (*)    All other components  within normal limits  LACTIC ACID, PLASMA - Abnormal; Notable for the following components:   Lactic Acid, Venous 2.4 (*)    All other components within normal limits  BRAIN NATRIURETIC PEPTIDE  PROCALCITONIN  LACTIC ACID, PLASMA  URINALYSIS, COMPLETE (UACMP) WITH MICROSCOPIC  TROPONIN I (HIGH SENSITIVITY)  TROPONIN I (HIGH SENSITIVITY)   ____________________________________________  EKG  Sinus rhythm with a ventricular of 73, normal axis, unremarkable intervals with some abnormal R wave progression in V3 without other clearance of acute ischemia or significant underlying arrhythmia. ____________________________________________  RADIOLOGY  ED MD interpretation: No focal consolidation, overt edema, thorax, effusion or any other clear acute thoracic process.  Official radiology report(s): DG Chest 1 View  Result Date: 09/20/2020 CLINICAL DATA:  Syncopal episode at church.  Low blood pressure. EXAM: CHEST  1 VIEW COMPARISON:  07/29/2017 FINDINGS: Median sternotomy and CABG. Heart size is normal. The lungs are free of focal consolidations and pleural effusions. No pulmonary edema. IMPRESSION: No evidence for acute  abnormality. Electronically Signed   By: Nolon Nations M.D.   On: 09/20/2020 11:55    ____________________________________________   PROCEDURES  Procedure(s) performed (including Critical Care):  Procedures   ____________________________________________   INITIAL IMPRESSION / ASSESSMENT AND PLAN / ED COURSE      Patient presents with above to history exam for assessment after she had a syncopal episode at church.  She  was reportedly hypotensive with EMS with systolics of 40/98 and 11/91 which improved to 119/90 after she received 500 cc of normal saline.  On arrival in the emergency room she is afebrile and hemodynamically stable with a BP of 119/90.  She states she feels at her baseline and has a nonfocal neuro exam.  Differential includes orthostatic versus  vasovagal, possible effects from her metoprolol and Imdur versus overdiuresis from Lasix.  There are no focal deficits to suggest stroke.  No recent history exam findings to suggest acute trauma.  Very low sufficient for toxic ingestion.  She does not appear volume overloaded on her chest x-ray exam and elicitation for acute heart failure exacerbation.  She has no hypoxia tachypnea tachycardia denies any chest pain or shortness of breath and overall I have a low suspicion for PE at this time.  CMP remarkable for chloride of 112, bicarb of 18, BUN of 33 and a creatinine of 2.93 compared to 2.73 over the last month on Care Everywhere.  No other significant derangements.  Troponin is nonelevated and overall low suspicion for ACS at this time.  BNP is double digits and patient does not appear volume overloaded on exam or x-ray with patient for heart failure.  CBC has no leukocytosis or acute anemia.  Pro calcitonin 0.1 and given absence of leukocytosis or fever or findings on x-ray or other historical or exam features Evalose patient for acute infectious process.  Lactic acid is elevated at 2.4.  This consistent with patient's soft blood pressures emergency room of 93/44 and 88/55 couple of rechecks while she was undergoing fluid resuscitation.  Further questioning it seems patient takes Lasix once weekly and takes her weekly dose yesterday.  She is also on laxative and has loose stools every day and took Imdur this morning.  Suspect her low blood pressure syncope and lactic acidosis is all related to volume loss which sounds multifactorial.  Strongly advised patient that I am not sure exactly what medicine she is taking.  Is the GI losses that are precipitating her syncope and low blood pressure today although I strongly advised hospital admission for ongoing fluid resuscitation and further evaluation.  Patient was adamant she did not wish to stay and understood that she could still be severely dehydrated and die  if she passed out again.  She also stated she understood that I was not exactly sure exactly because of her low blood pressures at this time and that my recommendation was for admission but she states she was to go home and drink fluids.  I think she has capacity to leave against my advice.  Discharged [place].   ____________________________________________   FINAL CLINICAL IMPRESSION(S) / ED DIAGNOSES  Final diagnoses:  Syncope and collapse  Hypotension, unspecified hypotension type  Lactic acidosis    Medications  lactated ringers infusion ( Intravenous Stopped 09/20/20 1422)  lactated ringers bolus 500 mL (0 mLs Intravenous Stopped 09/20/20 1155)     ED Discharge Orders    None       Note:  This document was prepared using Dragon voice recognition software and may include unintentional dictation errors.   Lucrezia Starch, MD 09/20/20 [phone]

## 2020-09-20 NOTE — ED Notes (Signed)
Lab called with critical lactic of 2.4, provider [facility] notified.

## 2020-09-20 NOTE — ED Notes (Signed)
Dr. Tamala Julian had long discussion with patient and wife. They would like to leave. Dr. Tamala Julian advised they would be leaving AMA, everyone agreeable - aware of risks. Plan to finish 500 cc LR then patient can leave AMA.

## 2020-10-26 ENCOUNTER — Inpatient Hospital Stay: Payer: Medicare HMO | Attending: Oncology

## 2020-10-26 ENCOUNTER — Other Ambulatory Visit: Payer: Self-pay

## 2020-10-26 DIAGNOSIS — N189 Chronic kidney disease, unspecified: Secondary | ICD-10-CM | POA: Diagnosis not present

## 2020-10-26 DIAGNOSIS — D509 Iron deficiency anemia, unspecified: Secondary | ICD-10-CM | POA: Diagnosis not present

## 2020-10-26 LAB — CBC WITH DIFFERENTIAL/PLATELET
Abs Immature Granulocytes: 0.07 10*3/uL (ref 0.00–0.07)
Basophils Absolute: 0.1 10*3/uL (ref 0.0–0.1)
Basophils Relative: 1 %
Eosinophils Absolute: 0.4 10*3/uL (ref 0.0–0.5)
Eosinophils Relative: 6 %
HCT: 30.2 % — ABNORMAL LOW (ref 36.0–46.0)
Hemoglobin: 10.1 g/dL — ABNORMAL LOW (ref 12.0–15.0)
Immature Granulocytes: 1 %
Lymphocytes Relative: 22 %
Lymphs Abs: 1.5 10*3/uL (ref 0.7–4.0)
MCH: 30.8 pg (ref 26.0–34.0)
MCHC: 33.4 g/dL (ref 30.0–36.0)
MCV: 92.1 fL (ref 80.0–100.0)
Monocytes Absolute: 0.5 10*3/uL (ref 0.1–1.0)
Monocytes Relative: 8 %
Neutro Abs: 4.3 10*3/uL (ref 1.7–7.7)
Neutrophils Relative %: 62 %
Platelets: 245 10*3/uL (ref 150–400)
RBC: 3.28 MIL/uL — ABNORMAL LOW (ref 3.87–5.11)
RDW: 14.7 % (ref 11.5–15.5)
WBC: 6.9 10*3/uL (ref 4.0–10.5)
nRBC: 0 % (ref 0.0–0.2)

## 2020-10-26 LAB — FERRITIN: Ferritin: 172 ng/mL (ref 11–307)

## 2020-10-26 LAB — IRON AND TIBC
Iron: 91 ug/dL (ref 28–170)
Saturation Ratios: 41 % — ABNORMAL HIGH (ref 10.4–31.8)
TIBC: 220 ug/dL — ABNORMAL LOW (ref 250–450)
UIBC: 129 ug/dL

## 2020-11-02 ENCOUNTER — Encounter: Payer: Self-pay | Admitting: Nurse Practitioner

## 2020-11-02 ENCOUNTER — Inpatient Hospital Stay: Payer: Medicare HMO | Admitting: Nurse Practitioner

## 2020-11-02 ENCOUNTER — Other Ambulatory Visit: Payer: Self-pay

## 2020-11-02 VITALS — BP 101/71 | HR 91 | Temp 98.2°F | Resp 16 | Wt 100.6 lb

## 2020-11-02 DIAGNOSIS — D631 Anemia in chronic kidney disease: Secondary | ICD-10-CM | POA: Insufficient documentation

## 2020-11-02 DIAGNOSIS — D509 Iron deficiency anemia, unspecified: Secondary | ICD-10-CM

## 2020-11-02 DIAGNOSIS — N184 Chronic kidney disease, stage 4 (severe): Secondary | ICD-10-CM | POA: Diagnosis not present

## 2020-11-02 NOTE — Progress Notes (Signed)
[place] [facility]  [phone] Fax:(336) [phone]  ID: Ellene Route OB: 10-Apr-1946  MR#: [id number]  TSV#:[id number]  Patient Care Team: Tracie Harrier, MD as PCP - General (Internal Medicine)  CHIEF COMPLAINT: Iron deficiency anemia  INTERVAL HISTORY: Patient returns to clinic for repeat lab work and further evaluation.  Patient continues to feel well.  Denies any neurologic complaints. Denies recent fevers or illnesses. Denies any easy bleeding or bruising. No melena or hematochezia. No pica or restless leg. Reports good appetite and denies weight loss. Denies chest pain. Denies any nausea, vomiting, constipation, or diarrhea. Denies urinary complaints. Patient offers no further specific complaints today.  REVIEW OF SYSTEMS:   Review of Systems  Constitutional: Negative.  Negative for fever, malaise/fatigue and weight loss.  Respiratory: Negative.  Negative for cough, hemoptysis and shortness of breath.   Cardiovascular: Negative.  Negative for chest pain and leg swelling.  Gastrointestinal: Negative.  Negative for abdominal pain, blood in stool and melena.  Genitourinary: Negative.  Negative for dysuria and hematuria.  Musculoskeletal: Negative.  Negative for back pain.  Skin: Negative.  Negative for rash.  Neurological: Negative.  Negative for dizziness, focal weakness, weakness and headaches.  Psychiatric/Behavioral: Negative.  The patient is not nervous/anxious.    As per HPI. Otherwise, a complete review of systems is negative.  PAST MEDICAL HISTORY: Past Medical History:  Diagnosis Date   Acid reflux    Anemia    CAD (coronary artery disease)    Chronic constipation    Chronic kidney disease    stage 3 chronic kidney disease   Eczema    H/O heart artery stent    Hyperlipidemia    Hypertension    IBS (irritable bowel syndrome) 2004   Migraine    Mitral valve prolapse    Peptic ulcer 1989   Skin cancer     PAST SURGICAL  HISTORY: Past Surgical History:  Procedure Laterality Date   ABDOMINAL HYSTERECTOMY     APPENDECTOMY     CARDIAC CATHETERIZATION N/A 10/21/2015   Procedure: Left Heart Cath and Coronary Angiography;  Surgeon: Isaias Cowman, MD;  Location: [facility];  Service: Cardiovascular;  Laterality: N/A;   CARDIAC CATHETERIZATION N/A 10/21/2015   Procedure: Coronary Stent Intervention;  Surgeon: Isaias Cowman, MD;  Location: [facility];  Service: Cardiovascular;  Laterality: N/A;   CARDIAC CATHETERIZATION Left 11/24/2015   Procedure: Coronary Stent Intervention;  Surgeon: Isaias Cowman, MD;  Location: [facility];  Service: Cardiovascular;  Laterality: Left;   CARDIAC SURGERY  2007   cardiac stent place   CATARACT EXTRACTION  2012   CHOLECYSTECTOMY     CORONARY ANGIOPLASTY WITH STENT PLACEMENT  2013   CORONARY ARTERY BYPASS GRAFT  2008   CORONARY STENT INTERVENTION N/A 07/04/2017   Procedure: CORONARY STENT INTERVENTION;  Surgeon: Isaias Cowman, MD;  Location: [facility];  Service: Cardiovascular;  Laterality: N/A;   CORONARY STENT INTERVENTION N/A 08/08/2017   Procedure: CORONARY STENT INTERVENTION;  Surgeon: Isaias Cowman, MD;  Location: [facility];  Service: Cardiovascular;  Laterality: N/A;   CYSTOSCOPY W/ RETROGRADES Bilateral 12/29/2014   Procedure: CYSTOSCOPY WITH RETROGRADE PYELOGRAM;  Surgeon: Collier Flowers, MD;  Location: [facility];  Service: Urology;  Laterality: Bilateral;   CYSTOSCOPY WITH BIOPSY N/A 12/29/2014   Procedure: CYSTOSCOPY WITH BIOPSY;  Surgeon: Collier Flowers, MD;  Location: [facility];  Service: Urology;  Laterality: N/A;   HAND SURGERY  1998   LEFT  HEART CATH AND CORONARY ANGIOGRAPHY Left 08/08/2017   Procedure: LEFT HEART CATH AND CORONARY ANGIOGRAPHY;  Surgeon: Isaias Cowman, MD;  Location: [facility];  Service: Cardiovascular;  Laterality: Left;   LEFT HEART CATH AND CORONARY  ANGIOGRAPHY Left 08/01/2019   Procedure: LEFT HEART CATH AND CORONARY ANGIOGRAPHY;  Surgeon: Isaias Cowman, MD;  Location: [facility];  Service: Cardiovascular;  Laterality: Left;   LEFT HEART CATH AND CORS/GRAFTS ANGIOGRAPHY N/A 07/04/2017   Procedure: LEFT HEART CATH AND CORS/GRAFTS ANGIOGRAPHY;  Surgeon: Isaias Cowman, MD;  Location: [facility];  Service: Cardiovascular;  Laterality: N/A;   SIGMOID RESECTION / RECTOPEXY  2006   SKIN CANCER EXCISION  2013   TENDON REPAIR     right elbow   TRIGGER FINGER RELEASE      FAMILY HISTORY: Family History  Problem Relation Age of Onset   Kidney Stones Father    Prostate cancer Father    Heart disease Father    Kidney Stones Brother    Heart disease Mother     ADVANCED DIRECTIVES (Y/N):  N  HEALTH MAINTENANCE: Social History   Tobacco Use   Smoking status: Never   Smokeless tobacco: Never  Vaping Use   Vaping Use: Never used  Substance Use Topics   Alcohol use: No   Drug use: No     Colonoscopy:  PAP:  Bone density:  Lipid panel:  Allergies  Allergen Reactions   Prochlorperazine Nausea And Vomiting, Other (See Comments) and Nausea Only    Severe vomiting   Ezetimibe Other (See Comments)    Muscle Pain   Statins Other (See Comments)    Muscle Pain   Tape Other (See Comments)    Pulls my skin off when the tape is removed. Use paper tape only.   Cephalexin Rash   Metoclopramide Rash and Other (See Comments)    Elevates BP, face draws to one side   Penicillins Rash and Other (See Comments)    Has patient had a PCN reaction causing immediate rash, facial/tongue/throat swelling, SOB or lightheadedness with hypotension: Yes Has patient had a PCN reaction causing severe rash involving mucus membranes or skin necrosis: Yes Has patient had a PCN reaction that required hospitalization No Has patient had a PCN reaction occurring within the last 10 years: No If all of the above answers are "NO",  then may proceed with Cephalosporin use.     Current Outpatient Medications  Medication Sig Dispense Refill   acetaminophen (TYLENOL) 325 MG tablet Take 650 mg by mouth every 6 (six) hours as needed (for pain.).     allopurinol (ZYLOPRIM) 100 MG tablet Take 100 mg by mouth daily with lunch.      aspirin 325 MG tablet Take 325 mg by mouth daily with lunch.      AZO-CRANBERRY PO Take 1 capsule by mouth daily with lunch.      calcitRIOL (ROCALTROL) 0.25 MCG capsule 1 capsule once daily     CANNABIDIOL PO Take 15 mg by mouth daily. CBD Gummies     clopidogrel (PLAVIX) 75 MG tablet Take 75 mg by mouth daily with lunch.      furosemide (LASIX) 40 MG tablet Take 20 mg by mouth once a week. Every other day with lunch     gabapentin (NEURONTIN) 100 MG capsule Take 100 mg by mouth at bedtime.      hyoscyamine (LEVSIN SL) 0.125 MG SL tablet Take 0.125 mg by mouth daily.  isosorbide mononitrate (IMDUR) 60 MG 24 hr tablet Take 60 mg by mouth daily.      MAGNESIUM CITRATE PO Take 100 mg by mouth daily.      metoprolol succinate (TOPROL-XL) 25 MG 24 hr tablet Take 25 mg by mouth daily with lunch. 1/2 tab QD     nitroGLYCERIN (NITROSTAT) 0.4 MG SL tablet Place 0.4 mg under the tongue every 5 (five) minutes as needed for chest pain.      pantoprazole (PROTONIX) 40 MG tablet Take 40 mg by mouth daily before breakfast.      potassium chloride (MICRO-K) 10 MEQ CR capsule Take 20 mEq by mouth in the morning and at bedtime.      promethazine (PHENERGAN) 25 MG tablet Take 25 mg by mouth every 8 (eight) hours as needed for nausea or vomiting.      ranolazine (RANEXA) 500 MG 12 hr tablet Take 1 tablet by mouth daily.     riboflavin (VITAMIN B-2) 100 MG TABS tablet Take 100 mg by mouth daily with lunch.      senna (SENOKOT) 8.6 MG tablet Take 4-6 tablets by mouth at bedtime.      sodium bicarbonate 650 MG tablet Take 650 mg by mouth daily with lunch.     Vitamin D, Ergocalciferol, (DRISDOL) 1.25 MG (50000 UNIT)  CAPS capsule Take 50,000 Units by mouth every 7 (seven) days.     vitamin E 400 UNIT capsule Take 400 Units by mouth daily with lunch.      ergocalciferol (VITAMIN D2) 1.25 MG (50000 UT) capsule Take by mouth. (Patient not taking: Reported on 11/02/2020)     No current facility-administered medications for this visit.    OBJECTIVE: Vitals:   11/02/20 1022  BP: 101/71  Pulse: 91  Resp: 16  Temp: 98.2 F (36.8 C)     Body mass index is 19.65 kg/m.    ECOG FS:0 - Asymptomatic  General: Well-developed, well-nourished, no acute distress. Eyes: Pink conjunctiva, anicteric sclera. Lungs: Clear to auscultation bilaterally.  No audible wheezing or coughing Heart: Regular rate and rhythm.  Abdomen: Soft, nontender, nondistended.  Musculoskeletal: No edema, cyanosis, or clubbing. Neuro: Alert, answering all questions appropriately. Cranial nerves grossly intact. Skin: No rashes or petechiae noted. Psych: Normal affect.   LAB RESULTS:  Lab Results  Component Value Date   NA 138 09/20/2020   K 3.6 09/20/2020   CL 112 (H) 09/20/2020   CO2 18 (L) 09/20/2020   GLUCOSE 93 09/20/2020   BUN 33 (H) 09/20/2020   CREATININE 2.93 (H) 09/20/2020   CALCIUM 8.3 (L) 09/20/2020   PROT 6.1 (L) 09/20/2020   ALBUMIN 3.6 09/20/2020   AST 16 09/20/2020   ALT 8 09/20/2020   ALKPHOS 63 09/20/2020   BILITOT 0.6 09/20/2020   GFRNONAA 16 (L) 09/20/2020   GFRAA 39 (L) 08/09/2017    Lab Results  Component Value Date   WBC 6.9 10/26/2020   NEUTROABS 4.3 10/26/2020   HGB 10.1 (L) 10/26/2020   HCT 30.2 (L) 10/26/2020   MCV 92.1 10/26/2020   PLT 245 10/26/2020   Lab Results  Component Value Date   IRON 91 10/26/2020   TIBC 220 (L) 10/26/2020   IRONPCTSAT 41 (H) 10/26/2020   Lab Results  Component Value Date   FERRITIN 172 10/26/2020     STUDIES: No results found.  ASSESSMENT: Iron deficiency anemia  PLAN:    1. Iron deficiency anemia: Last received IV venofer 08/22/19. Hemoglobin  mildly decreased at 10.1  but overall stable. Normocytic. Ferritin normal. Iron saturation elevated at 41%. Given her significant cardiac disease, will continue to monitor closely and try to maintain hemoglobin greater than 10.0.  She does not require treatment today.  Return to clinic in 4 months with repeat laboratory work and further evaluation. 2.  Chronic renal insufficiency: Chronic. Creatinine 2.93 in May 2022. May benefit from retacrit if hemoglobin drops < 10. Continue follow up with nephrology. No retacrit today.  3.  Cardiac disease: Continue follow-up with cardiology as scheduled.   4.  Dizziness: Patient does not complain of this today.  Patient expressed understanding and was in agreement with this plan. She also understands that She can call clinic at any time with any questions, concerns, or complaints.    Verlon Au, NP   11/02/2020 11:05 AM

## 2020-11-02 NOTE — Progress Notes (Signed)
Patient has history of syncope episodes that is followed by cardiologist.  Reports an increase in fatigue.

## 2021-01-04 ENCOUNTER — Other Ambulatory Visit: Payer: Self-pay | Admitting: Internal Medicine

## 2021-01-04 DIAGNOSIS — Z1231 Encounter for screening mammogram for malignant neoplasm of breast: Secondary | ICD-10-CM

## 2021-01-14 ENCOUNTER — Encounter: Payer: Self-pay | Admitting: Oncology

## 2021-01-20 NOTE — Progress Notes (Signed)
MRN : [id number]  [name] is a 75 y.o. (12-Apr-1946) female who presents with chief complaint of check carotid arteries.  History of Present Illness:   The patient is seen for follow up evaluation of carotid stenosis as well as presumed renal artery stenosis. The carotid stenosis followed by ultrasound.    The patient denies amaurosis fugax. There is no recent history of TIA symptoms or focal motor deficits. There is no prior documented CVA.   The patient is taking enteric-coated aspirin 81 mg daily.   There is no history of migraine headaches. There is no history of seizures.   The patient has a history of coronary artery disease, no recent episodes of angina or shortness of breath. The patient denies PAD or claudication symptoms. There is a history of hyperlipidemia which is being treated with a statin.     Carotid Duplex done today shows 1-39% right internal carotid artery stenosis and 60 to 79% left internal carotid artery stenosis.  No change compared to the previous study.  No outpatient medications have been marked as taking for the 01/21/21 encounter (Appointment) with Delana Meyer, Dolores Lory, MD.    Past Medical History:  Diagnosis Date   Acid reflux    Anemia    CAD (coronary artery disease)    Chronic constipation    Chronic kidney disease    stage 3 chronic kidney disease   Eczema    H/O heart artery stent    Hyperlipidemia    Hypertension    IBS (irritable bowel syndrome) 2004   Migraine    Mitral valve prolapse    Peptic ulcer 1989   Skin cancer     Past Surgical History:  Procedure Laterality Date   ABDOMINAL HYSTERECTOMY     APPENDECTOMY     CARDIAC CATHETERIZATION N/A 10/21/2015   Procedure: Left Heart Cath and Coronary Angiography;  Surgeon: Isaias Cowman, MD;  Location: [facility];  Service: Cardiovascular;  Laterality: N/A;   CARDIAC CATHETERIZATION N/A 10/21/2015   Procedure: Coronary Stent Intervention;  Surgeon: Isaias Cowman, MD;  Location: [facility];  Service: Cardiovascular;  Laterality: N/A;   CARDIAC CATHETERIZATION Left 11/24/2015   Procedure: Coronary Stent Intervention;  Surgeon: Isaias Cowman, MD;  Location: [facility];  Service: Cardiovascular;  Laterality: Left;   CARDIAC SURGERY  2007   cardiac stent place   CATARACT EXTRACTION  2012   CHOLECYSTECTOMY     CORONARY ANGIOPLASTY WITH STENT PLACEMENT  2013   CORONARY ARTERY BYPASS GRAFT  2008   CORONARY STENT INTERVENTION N/A 07/04/2017   Procedure: CORONARY STENT INTERVENTION;  Surgeon: Isaias Cowman, MD;  Location: [facility];  Service: Cardiovascular;  Laterality: N/A;   CORONARY STENT INTERVENTION N/A 08/08/2017   Procedure: CORONARY STENT INTERVENTION;  Surgeon: Isaias Cowman, MD;  Location: [facility];  Service: Cardiovascular;  Laterality: N/A;   CYSTOSCOPY W/ RETROGRADES Bilateral 12/29/2014   Procedure: CYSTOSCOPY WITH RETROGRADE PYELOGRAM;  Surgeon: Collier Flowers, MD;  Location: [facility];  Service: Urology;  Laterality: Bilateral;   CYSTOSCOPY WITH BIOPSY N/A 12/29/2014   Procedure: CYSTOSCOPY WITH BIOPSY;  Surgeon: Collier Flowers, MD;  Location: [facility];  Service: Urology;  Laterality: N/A;   HAND SURGERY  1998   LEFT HEART CATH AND CORONARY ANGIOGRAPHY Left 08/08/2017   Procedure: LEFT HEART CATH AND CORONARY ANGIOGRAPHY;  Surgeon: Isaias Cowman, MD;  Location: [facility];  Service: Cardiovascular;  Laterality: Left;  LEFT HEART CATH AND CORONARY ANGIOGRAPHY Left 08/01/2019   Procedure: LEFT HEART CATH AND CORONARY ANGIOGRAPHY;  Surgeon: Isaias Cowman, MD;  Location: [facility];  Service: Cardiovascular;  Laterality: Left;   LEFT HEART CATH AND CORS/GRAFTS ANGIOGRAPHY N/A 07/04/2017   Procedure: LEFT HEART CATH AND CORS/GRAFTS ANGIOGRAPHY;  Surgeon: Isaias Cowman, MD;  Location: [facility];  Service: Cardiovascular;  Laterality:  N/A;   SIGMOID RESECTION / RECTOPEXY  2006   SKIN CANCER EXCISION  2013   TENDON REPAIR     right elbow   TRIGGER FINGER RELEASE      Social History Social History   Tobacco Use   Smoking status: Never   Smokeless tobacco: Never  Vaping Use   Vaping Use: Never used  Substance Use Topics   Alcohol use: No   Drug use: No    Family History Family History  Problem Relation Age of Onset   Kidney Stones Father    Prostate cancer Father    Heart disease Father    Kidney Stones Brother    Heart disease Mother     Allergies  Allergen Reactions   Prochlorperazine Nausea And Vomiting, Other (See Comments) and Nausea Only    Severe vomiting   Ezetimibe Other (See Comments)    Muscle Pain   Statins Other (See Comments)    Muscle Pain   Tape Other (See Comments)    Pulls my skin off when the tape is removed. Use paper tape only.   Cephalexin Rash   Metoclopramide Rash and Other (See Comments)    Elevates BP, face draws to one side   Penicillins Rash and Other (See Comments)    Has patient had a PCN reaction causing immediate rash, facial/tongue/throat swelling, SOB or lightheadedness with hypotension: Yes Has patient had a PCN reaction causing severe rash involving mucus membranes or skin necrosis: Yes Has patient had a PCN reaction that required hospitalization No Has patient had a PCN reaction occurring within the last 10 years: No If all of the above answers are "NO", then may proceed with Cephalosporin use.      REVIEW OF SYSTEMS (Negative unless checked)  Constitutional: [] Weight loss  [] Fever  [] Chills Cardiac: [] Chest pain   [] Chest pressure   [] Palpitations   [] Shortness of breath when laying flat   [] Shortness of breath with exertion. Vascular:  [] Pain in legs with walking   [] Pain in legs at rest  [] History of DVT   [] Phlebitis   [] Swelling in legs   [] Varicose veins   [] Non-healing ulcers Pulmonary:   [] Uses home oxygen   [] Productive cough   [] Hemoptysis    [] Wheeze  [] COPD   [] Asthma Neurologic:  [] Dizziness   [] Seizures   [] History of stroke   [] History of TIA  [] Aphasia   [] Vissual changes   [] Weakness or numbness in arm   [] Weakness or numbness in leg Musculoskeletal:   [] Joint swelling   [] Joint pain   [] Low back pain Hematologic:  [] Easy bruising  [] Easy bleeding   [] Hypercoagulable state   [] Anemic Gastrointestinal:  [] Diarrhea   [] Vomiting  [x] Gastroesophageal reflux/heartburn   [] Difficulty swallowing. Genitourinary:  [] Chronic kidney disease   [] Difficult urination  [] Frequent urination   [] Blood in urine Skin:  [] Rashes   [] Ulcers  Psychological:  [] History of anxiety   []  History of major depression.  Physical Examination  There were no vitals filed for this visit. There is no height or weight on file to calculate BMI. Gen: WD/WN, NAD  Head: [place]/AT, No temporalis wasting.  Ear/Nose/Throat: Hearing grossly intact, nares w/o erythema or drainage Eyes: PER, EOMI, sclera nonicteric.  Neck: Supple, no masses.  No bruit or JVD.  Pulmonary:  Good air movement, no audible wheezing, no use of accessory muscles.  Cardiac: RRR, normal S1, S2, no Murmurs. Vascular:    Vessel Right Left  Radial Palpable Palpable  Carotid Palpable Palpable  Gastrointestinal: soft, non-distended. No guarding/no peritoneal signs.  Musculoskeletal: M/S 5/5 throughout.  No visible deformity.  Neurologic: CN 2-12 intact. Pain and light touch intact in extremities.  Symmetrical.  Speech is fluent. Motor exam as listed above. Psychiatric: Judgment intact, Mood & affect appropriate for pt's clinical situation. Dermatologic: No rashes or ulcers noted.  No changes consistent with cellulitis.   CBC Lab Results  Component Value Date   WBC 6.9 10/26/2020   HGB 10.1 (L) 10/26/2020   HCT 30.2 (L) 10/26/2020   MCV 92.1 10/26/2020   PLT 245 10/26/2020    BMET    Component Value Date/Time   NA 138 09/20/2020 1049   NA 137 05/24/2013 0357   K 3.6 09/20/2020 1049    K 2.3 (LL) 05/24/2013 0357   CL 112 (H) 09/20/2020 1049   CL 97 (L) 05/24/2013 0357   CO2 18 (L) 09/20/2020 1049   CO2 36 (H) 05/24/2013 0357   GLUCOSE 93 09/20/2020 1049   GLUCOSE 91 05/24/2013 0357   BUN 33 (H) 09/20/2020 1049   BUN 21 (H) 05/24/2013 0357   CREATININE 2.93 (H) 09/20/2020 1049   CREATININE 1.32 (H) 05/24/2013 0357   CALCIUM 8.3 (L) 09/20/2020 1049   CALCIUM 7.9 (L) 05/24/2013 0357   GFRNONAA 16 (L) 09/20/2020 1049   GFRNONAA 42 (L) 05/24/2013 0357   GFRAA 39 (L) 08/09/2017 0409   GFRAA 49 (L) 05/24/2013 0357   CrCl cannot be calculated (Patient's most recent lab result is older than the maximum 21 days allowed.).  COAG Lab Results  Component Value Date   INR 1.5 12/04/2006   INR 0.9 11/30/2006    Radiology No results found.   Assessment/Plan 1. Bilateral carotid artery stenosis Recommend:   Given the patient's asymptomatic subcritical stenosis no further invasive testing or surgery at this time.   Duplex ultrasound shows 40 to 59% stenosis of the right internal carotid artery and 60 to 79% stenosis of the left internal carotid artery.   Continue antiplatelet therapy as prescribed Continue management of CAD, HTN and Hyperlipidemia Healthy heart diet,  encouraged exercise at least 4 times per week Follow up in 6 months with duplex ultrasound and physical exam    - VAS US CAROTID; Future  2. Renal artery stenosis (HCC) Given patient's arterial disease optimal control of the patient's hypertension is important.  BP is acceptable today and the fact of the matter is she has relatively low blood pressure and once again does not appear to have hypertension SBP of 96 today.   The patient's vital signs and noninvasive studies support the renal arteries are not significantly affected by plaque.   No invasive studies or intervention is indicated at this time.   The patient will continue the current medications, no changes at this time.   The primary  medical service will continue aggressive antihypertensive therapy as per the AHA guidelines   3. Coronary artery disease of native artery of native heart with stable angina pectoris (HCC) Continue cardiac and antihypertensive medications as already ordered and reviewed, no changes at this time.  Continue statin as  ordered and reviewed, no changes at this time  Nitrates PRN for chest pain   4. Primary hypertension Continue antihypertensive medications as already ordered, these medications have been reviewed and there are no changes at this time.   5. Gastro-esophageal reflux disease without esophagitis Continue PPI as already ordered, this medication has been reviewed and there are no changes at this time.  Avoidence of caffeine and alcohol  Moderate elevation of the head of the bed      Hortencia Pilar, MD  01/20/2021 9:30 PM

## 2021-01-21 ENCOUNTER — Encounter (INDEPENDENT_AMBULATORY_CARE_PROVIDER_SITE_OTHER): Payer: Self-pay | Admitting: Vascular Surgery

## 2021-01-21 ENCOUNTER — Ambulatory Visit (INDEPENDENT_AMBULATORY_CARE_PROVIDER_SITE_OTHER): Payer: Medicare HMO | Admitting: Vascular Surgery

## 2021-01-21 ENCOUNTER — Other Ambulatory Visit: Payer: Self-pay

## 2021-01-21 ENCOUNTER — Ambulatory Visit (INDEPENDENT_AMBULATORY_CARE_PROVIDER_SITE_OTHER): Payer: Medicare HMO

## 2021-01-21 VITALS — BP 96/60 | HR 84 | Ht 60.0 in | Wt 100.0 lb

## 2021-01-21 DIAGNOSIS — I6523 Occlusion and stenosis of bilateral carotid arteries: Secondary | ICD-10-CM | POA: Diagnosis not present

## 2021-01-21 DIAGNOSIS — K219 Gastro-esophageal reflux disease without esophagitis: Secondary | ICD-10-CM

## 2021-01-21 DIAGNOSIS — I25118 Atherosclerotic heart disease of native coronary artery with other forms of angina pectoris: Secondary | ICD-10-CM

## 2021-01-21 DIAGNOSIS — I701 Atherosclerosis of renal artery: Secondary | ICD-10-CM | POA: Diagnosis not present

## 2021-01-21 DIAGNOSIS — I10 Essential (primary) hypertension: Secondary | ICD-10-CM

## 2021-03-01 ENCOUNTER — Other Ambulatory Visit: Payer: Self-pay

## 2021-03-01 ENCOUNTER — Inpatient Hospital Stay: Payer: Medicare HMO | Attending: Oncology

## 2021-03-01 DIAGNOSIS — D509 Iron deficiency anemia, unspecified: Secondary | ICD-10-CM

## 2021-03-01 LAB — CBC WITH DIFFERENTIAL/PLATELET
Abs Immature Granulocytes: 0.05 10*3/uL (ref 0.00–0.07)
Basophils Absolute: 0.1 10*3/uL (ref 0.0–0.1)
Basophils Relative: 1 %
Eosinophils Absolute: 0.5 10*3/uL (ref 0.0–0.5)
Eosinophils Relative: 7 %
HCT: 29.5 % — ABNORMAL LOW (ref 36.0–46.0)
Hemoglobin: 9.6 g/dL — ABNORMAL LOW (ref 12.0–15.0)
Immature Granulocytes: 1 %
Lymphocytes Relative: 23 %
Lymphs Abs: 1.5 10*3/uL (ref 0.7–4.0)
MCH: 30.8 pg (ref 26.0–34.0)
MCHC: 32.5 g/dL (ref 30.0–36.0)
MCV: 94.6 fL (ref 80.0–100.0)
Monocytes Absolute: 0.7 10*3/uL (ref 0.1–1.0)
Monocytes Relative: 10 %
Neutro Abs: 3.9 10*3/uL (ref 1.7–7.7)
Neutrophils Relative %: 58 %
Platelets: 274 10*3/uL (ref 150–400)
RBC: 3.12 MIL/uL — ABNORMAL LOW (ref 3.87–5.11)
RDW: 14.7 % (ref 11.5–15.5)
WBC: 6.7 10*3/uL (ref 4.0–10.5)
nRBC: 0 % (ref 0.0–0.2)

## 2021-03-01 LAB — IRON AND TIBC
Iron: 68 ug/dL (ref 28–170)
Saturation Ratios: 33 % — ABNORMAL HIGH (ref 10.4–31.8)
TIBC: 207 ug/dL — ABNORMAL LOW (ref 250–450)
UIBC: 139 ug/dL

## 2021-03-01 LAB — FERRITIN: Ferritin: 136 ng/mL (ref 11–307)

## 2021-03-04 NOTE — Progress Notes (Signed)
[name]  [phone] [phone]  ID: Ellene Route OB: February 22, 1946  MR#: [id number]  KXF#:[id number]  Patient Care Team: Tracie Harrier, MD as PCP - General (Internal Medicine) Lloyd Huger, MD as Consulting Physician (Hematology and Oncology)  CHIEF COMPLAINT: Iron deficiency anemia  INTERVAL HISTORY: Patient returns to clinic today for repeat laboratory work, further evaluation, and consideration of additional IV Venofer if needed.  She continues to have chronic weakness and fatigue, but otherwise feels well.  She has no further dizziness since medication adjustment by cardiology.  She has no neurologic complaints. She denies any recent fevers or illnesses. She has a good appetite and denies weight loss.  She has no chest pain, shortness of breath, cough, or hemoptysis.  She denies any nausea, vomiting, constipation, or diarrhea.  She has no melena or hematochezia.  She has no urinary complaints.  Patient offers no further specific complaints today.  REVIEW OF SYSTEMS:   Review of Systems  Constitutional:  Positive for malaise/fatigue. Negative for fever and weight loss.  Respiratory: Negative.  Negative for cough, hemoptysis and shortness of breath.   Cardiovascular: Negative.  Negative for chest pain and leg swelling.  Gastrointestinal: Negative.  Negative for abdominal pain, blood in stool and melena.  Genitourinary: Negative.  Negative for dysuria and hematuria.  Musculoskeletal: Negative.  Negative for back pain.  Skin: Negative.  Negative for rash.  Neurological:  Positive for weakness. Negative for dizziness, focal weakness and headaches.  Psychiatric/Behavioral: Negative.  The patient is not nervous/anxious.    As per HPI. Otherwise, a complete review of systems is negative.  PAST MEDICAL HISTORY: Past Medical History:  Diagnosis Date   Acid reflux    Anemia    CAD (coronary artery disease)    Chronic constipation     Chronic kidney disease    stage 3 chronic kidney disease   Eczema    H/O heart artery stent    Hyperlipidemia    Hypertension    IBS (irritable bowel syndrome) 2004   Migraine    Mitral valve prolapse    Peptic ulcer 1989   Skin cancer     PAST SURGICAL HISTORY: Past Surgical History:  Procedure Laterality Date   ABDOMINAL HYSTERECTOMY     APPENDECTOMY     CARDIAC CATHETERIZATION N/A 10/21/2015   Procedure: Left Heart Cath and Coronary Angiography;  Surgeon: Isaias Cowman, MD;  Location: [facility];  Service: Cardiovascular;  Laterality: N/A;   CARDIAC CATHETERIZATION N/A 10/21/2015   Procedure: Coronary Stent Intervention;  Surgeon: Isaias Cowman, MD;  Location: [facility];  Service: Cardiovascular;  Laterality: N/A;   CARDIAC CATHETERIZATION Left 11/24/2015   Procedure: Coronary Stent Intervention;  Surgeon: Isaias Cowman, MD;  Location: [facility];  Service: Cardiovascular;  Laterality: Left;   CARDIAC SURGERY  2007   cardiac stent place   CATARACT EXTRACTION  2012   CHOLECYSTECTOMY     CORONARY ANGIOPLASTY WITH STENT PLACEMENT  2013   CORONARY ARTERY BYPASS GRAFT  2008   CORONARY STENT INTERVENTION N/A 07/04/2017   Procedure: CORONARY STENT INTERVENTION;  Surgeon: Isaias Cowman, MD;  Location: [facility];  Service: Cardiovascular;  Laterality: N/A;   CORONARY STENT INTERVENTION N/A 08/08/2017   Procedure: CORONARY STENT INTERVENTION;  Surgeon: Isaias Cowman, MD;  Location: [facility];  Service: Cardiovascular;  Laterality: N/A;   CYSTOSCOPY W/ RETROGRADES Bilateral 12/29/2014   Procedure: CYSTOSCOPY WITH RETROGRADE PYELOGRAM;  Surgeon: Janice Coffin  Elnoria Howard, MD;  Location: [facility];  Service: Urology;  Laterality: Bilateral;   CYSTOSCOPY WITH BIOPSY N/A 12/29/2014   Procedure: CYSTOSCOPY WITH BIOPSY;  Surgeon: Collier Flowers, MD;  Location: [facility];  Service: Urology;  Laterality: N/A;   HAND SURGERY   1998   LEFT HEART CATH AND CORONARY ANGIOGRAPHY Left 08/08/2017   Procedure: LEFT HEART CATH AND CORONARY ANGIOGRAPHY;  Surgeon: Isaias Cowman, MD;  Location: [facility];  Service: Cardiovascular;  Laterality: Left;   LEFT HEART CATH AND CORONARY ANGIOGRAPHY Left 08/01/2019   Procedure: LEFT HEART CATH AND CORONARY ANGIOGRAPHY;  Surgeon: Isaias Cowman, MD;  Location: [facility];  Service: Cardiovascular;  Laterality: Left;   LEFT HEART CATH AND CORS/GRAFTS ANGIOGRAPHY N/A 07/04/2017   Procedure: LEFT HEART CATH AND CORS/GRAFTS ANGIOGRAPHY;  Surgeon: Isaias Cowman, MD;  Location: [facility];  Service: Cardiovascular;  Laterality: N/A;   SIGMOID RESECTION / RECTOPEXY  2006   SKIN CANCER EXCISION  2013   TENDON REPAIR     right elbow   TRIGGER FINGER RELEASE      FAMILY HISTORY: Family History  Problem Relation Age of Onset   Kidney Stones Father    Prostate cancer Father    Heart disease Father    Kidney Stones Brother    Heart disease Mother     ADVANCED DIRECTIVES (Y/N):  N  HEALTH MAINTENANCE: Social History   Tobacco Use   Smoking status: Never   Smokeless tobacco: Never  Vaping Use   Vaping Use: Never used  Substance Use Topics   Alcohol use: No   Drug use: No     Colonoscopy:  PAP:  Bone density:  Lipid panel:  Allergies  Allergen Reactions   Prochlorperazine Nausea And Vomiting, Other (See Comments) and Nausea Only    Severe vomiting   Ezetimibe Other (See Comments)    Muscle Pain   Statins Other (See Comments)    Muscle Pain   Tape Other (See Comments)    Pulls my skin off when the tape is removed. Use paper tape only.   Cephalexin Rash   Metoclopramide Rash and Other (See Comments)    Elevates BP, face draws to one side   Penicillins Rash and Other (See Comments)    Has patient had a PCN reaction causing immediate rash, facial/tongue/throat swelling, SOB or lightheadedness with hypotension: Yes Has  patient had a PCN reaction causing severe rash involving mucus membranes or skin necrosis: Yes Has patient had a PCN reaction that required hospitalization No Has patient had a PCN reaction occurring within the last 10 years: No If all of the above answers are "NO", then may proceed with Cephalosporin use.     Current Outpatient Medications  Medication Sig Dispense Refill   acetaminophen (TYLENOL) 325 MG tablet Take 650 mg by mouth every 6 (six) hours as needed (for pain.).     allopurinol (ZYLOPRIM) 100 MG tablet Take 100 mg by mouth daily with lunch.      aspirin 325 MG tablet Take 325 mg by mouth daily with lunch.      AZO-CRANBERRY PO Take 1 capsule by mouth daily with lunch.      calcitRIOL (ROCALTROL) 0.25 MCG capsule 1 capsule once daily     CANNABIDIOL PO Take 15 mg by mouth daily. CBD Gummies     clopidogrel (PLAVIX) 75 MG tablet Take 75 mg by mouth daily with lunch.      ergocalciferol (VITAMIN D2) 1.25 MG (  50000 UT) capsule Take by mouth.     furosemide (LASIX) 40 MG tablet Take 20 mg by mouth once a week. Every other day with lunch     gabapentin (NEURONTIN) 100 MG capsule Take 100 mg by mouth at bedtime.      hyoscyamine (LEVSIN SL) 0.125 MG SL tablet Take 0.125 mg by mouth daily.      isosorbide mononitrate (IMDUR) 60 MG 24 hr tablet Take 60 mg by mouth daily.      MAGNESIUM CITRATE PO Take 100 mg by mouth daily.      metoprolol succinate (TOPROL-XL) 25 MG 24 hr tablet Take 25 mg by mouth daily with lunch. 1/2 tab QD     nitroGLYCERIN (NITROSTAT) 0.4 MG SL tablet Place 0.4 mg under the tongue every 5 (five) minutes as needed for chest pain.      pantoprazole (PROTONIX) 40 MG tablet Take 40 mg by mouth daily before breakfast.      potassium chloride (MICRO-K) 10 MEQ CR capsule Take 20 mEq by mouth in the morning and at bedtime.      promethazine (PHENERGAN) 25 MG tablet Take 25 mg by mouth every 8 (eight) hours as needed for nausea or vomiting.      riboflavin (VITAMIN B-2)  100 MG TABS tablet Take 100 mg by mouth daily with lunch.      senna (SENOKOT) 8.6 MG tablet Take 4-6 tablets by mouth at bedtime.      sodium bicarbonate 650 MG tablet Take 650 mg by mouth daily with lunch.     Vitamin D, Ergocalciferol, (DRISDOL) 1.25 MG (50000 UNIT) CAPS capsule Take 50,000 Units by mouth every 7 (seven) days.     vitamin E 400 UNIT capsule Take 400 Units by mouth daily with lunch.      No current facility-administered medications for this visit.    OBJECTIVE: Vitals:   03/09/21 1028  BP: 117/67  Pulse: 78  Resp: 16  Temp: (!) 96.7 F (35.9 C)  SpO2: 100%     Body mass index is 19.9 kg/m.    ECOG FS:0 - Asymptomatic  General: Well-developed, well-nourished, no acute distress. Eyes: Pink conjunctiva, anicteric sclera. HEENT: Normocephalic, moist mucous membranes. Lungs: No audible wheezing or coughing. Heart: Regular rate and rhythm. Abdomen: Soft, nontender, no obvious distention. Musculoskeletal: No edema, cyanosis, or clubbing. Neuro: Alert, answering all questions appropriately. Cranial nerves grossly intact. Skin: No rashes or petechiae noted. Psych: Normal affect.  LAB RESULTS:  Lab Results  Component Value Date   NA 138 09/20/2020   K 3.6 09/20/2020   CL 112 (H) 09/20/2020   CO2 18 (L) 09/20/2020   GLUCOSE 93 09/20/2020   BUN 33 (H) 09/20/2020   CREATININE 2.93 (H) 09/20/2020   CALCIUM 8.3 (L) 09/20/2020   PROT 6.1 (L) 09/20/2020   ALBUMIN 3.6 09/20/2020   AST 16 09/20/2020   ALT 8 09/20/2020   ALKPHOS 63 09/20/2020   BILITOT 0.6 09/20/2020   GFRNONAA 16 (L) 09/20/2020   GFRAA 39 (L) 08/09/2017    Lab Results  Component Value Date   WBC 6.7 03/01/2021   NEUTROABS 3.9 03/01/2021   HGB 9.6 (L) 03/01/2021   HCT 29.5 (L) 03/01/2021   MCV 94.6 03/01/2021   PLT 274 03/01/2021   Lab Results  Component Value Date   IRON 68 03/01/2021   TIBC 207 (L) 03/01/2021   IRONPCTSAT 33 (H) 03/01/2021   Lab Results  Component Value Date    FERRITIN 136 03/01/2021  STUDIES: No results found.  ASSESSMENT: Iron deficiency anemia  PLAN:    1. Iron deficiency anemia: Patient's hemoglobin has trended down slightly to 9.6, but her iron stores continue to be within normal limits.  Given her significant cardiac disease, will continue to monitor closely and try to maintain hemoglobin greater than 10.0.  She does not require treatment today, but patient may benefit from Retacrit in the future if her hemoglobin continues to fall below 10.0.Marland Kitchen  Patient last received 200 mg IV Venofer on August 22, 2019.  Return to clinic in 4 months with repeat laboratory work, further evaluation, and continuation of treatment if needed.  2.  Chronic renal insufficiency: Chronic and unchanged.  Patient may benefit from Retacrit in the future if her hemoglobin remains below 10.0.  Continue follow-up with nephrology as scheduled. 3.  Cardiac disease: Continue follow-up with cardiology as scheduled.  Patient reports she has an appointment next week. 4.  Orthopedics: Patient reports she is having hand surgery next week.  Patient expressed understanding and was in agreement with this plan. She also understands that She can call clinic at any time with any questions, concerns, or complaints.    Lloyd Huger, MD   03/09/2021 12:17 PM

## 2021-03-09 ENCOUNTER — Inpatient Hospital Stay: Payer: Medicare HMO | Attending: Oncology | Admitting: Oncology

## 2021-03-09 ENCOUNTER — Other Ambulatory Visit: Payer: Self-pay

## 2021-03-09 VITALS — BP 117/67 | HR 78 | Temp 96.7°F | Resp 16 | Wt 101.9 lb

## 2021-03-09 DIAGNOSIS — N183 Chronic kidney disease, stage 3 unspecified: Secondary | ICD-10-CM | POA: Diagnosis not present

## 2021-03-09 DIAGNOSIS — Z79899 Other long term (current) drug therapy: Secondary | ICD-10-CM | POA: Insufficient documentation

## 2021-03-09 DIAGNOSIS — Z7982 Long term (current) use of aspirin: Secondary | ICD-10-CM | POA: Insufficient documentation

## 2021-03-09 DIAGNOSIS — I129 Hypertensive chronic kidney disease with stage 1 through stage 4 chronic kidney disease, or unspecified chronic kidney disease: Secondary | ICD-10-CM | POA: Insufficient documentation

## 2021-03-09 DIAGNOSIS — D509 Iron deficiency anemia, unspecified: Secondary | ICD-10-CM | POA: Diagnosis not present

## 2021-03-09 NOTE — Progress Notes (Signed)
Pt has no complaints at this time other than chronic fatigue

## 2021-06-14 ENCOUNTER — Ambulatory Visit
Admission: RE | Admit: 2021-06-14 | Discharge: 2021-06-14 | Disposition: A | Discharge status: Home / Self Care | Payer: Medicare HMO | Source: Ambulatory Visit | Attending: Internal Medicine | Admitting: Internal Medicine

## 2021-06-14 ENCOUNTER — Other Ambulatory Visit: Payer: Self-pay

## 2021-06-14 ENCOUNTER — Encounter: Payer: Medicare HMO | Admitting: Radiology

## 2021-06-14 DIAGNOSIS — Z1231 Encounter for screening mammogram for malignant neoplasm of breast: Secondary | ICD-10-CM | POA: Diagnosis not present

## 2021-06-23 ENCOUNTER — Other Ambulatory Visit: Payer: Self-pay | Admitting: Internal Medicine

## 2021-06-23 DIAGNOSIS — R921 Mammographic calcification found on diagnostic imaging of breast: Secondary | ICD-10-CM

## 2021-06-23 DIAGNOSIS — R928 Other abnormal and inconclusive findings on diagnostic imaging of breast: Secondary | ICD-10-CM

## 2021-06-23 DIAGNOSIS — N63 Unspecified lump in unspecified breast: Secondary | ICD-10-CM

## 2021-06-25 ENCOUNTER — Ambulatory Visit
Admission: RE | Admit: 2021-06-25 | Discharge: 2021-06-25 | Disposition: A | Discharge status: Home / Self Care | Payer: Medicare HMO | Source: Ambulatory Visit | Attending: Internal Medicine | Admitting: Internal Medicine

## 2021-06-25 ENCOUNTER — Other Ambulatory Visit: Payer: Self-pay

## 2021-06-25 DIAGNOSIS — R921 Mammographic calcification found on diagnostic imaging of breast: Secondary | ICD-10-CM

## 2021-06-25 DIAGNOSIS — R928 Other abnormal and inconclusive findings on diagnostic imaging of breast: Secondary | ICD-10-CM | POA: Insufficient documentation

## 2021-06-25 DIAGNOSIS — N63 Unspecified lump in unspecified breast: Secondary | ICD-10-CM

## 2021-06-29 ENCOUNTER — Other Ambulatory Visit: Payer: Self-pay | Admitting: Internal Medicine

## 2021-06-29 ENCOUNTER — Other Ambulatory Visit: Payer: Self-pay | Admitting: *Deleted

## 2021-06-29 DIAGNOSIS — D509 Iron deficiency anemia, unspecified: Secondary | ICD-10-CM

## 2021-06-29 DIAGNOSIS — R921 Mammographic calcification found on diagnostic imaging of breast: Secondary | ICD-10-CM

## 2021-06-29 DIAGNOSIS — N6310 Unspecified lump in the right breast, unspecified quadrant: Secondary | ICD-10-CM

## 2021-07-05 ENCOUNTER — Other Ambulatory Visit: Payer: Self-pay

## 2021-07-05 ENCOUNTER — Inpatient Hospital Stay: Payer: Medicare HMO | Attending: Nurse Practitioner

## 2021-07-05 DIAGNOSIS — D509 Iron deficiency anemia, unspecified: Secondary | ICD-10-CM | POA: Insufficient documentation

## 2021-07-05 LAB — CBC WITH DIFFERENTIAL/PLATELET
Abs Immature Granulocytes: 0.05 K/uL (ref 0.00–0.07)
Basophils Absolute: 0.1 K/uL (ref 0.0–0.1)
Basophils Relative: 1 %
Eosinophils Absolute: 0.3 K/uL (ref 0.0–0.5)
Eosinophils Relative: 4 %
HCT: 35.6 % — ABNORMAL LOW (ref 36.0–46.0)
Hemoglobin: 11.3 g/dL — ABNORMAL LOW (ref 12.0–15.0)
Immature Granulocytes: 1 %
Lymphocytes Relative: 15 %
Lymphs Abs: 1.2 K/uL (ref 0.7–4.0)
MCH: 30.2 pg (ref 26.0–34.0)
MCHC: 31.7 g/dL (ref 30.0–36.0)
MCV: 95.2 fL (ref 80.0–100.0)
Monocytes Absolute: 0.6 K/uL (ref 0.1–1.0)
Monocytes Relative: 7 %
Neutro Abs: 5.8 K/uL (ref 1.7–7.7)
Neutrophils Relative %: 72 %
Platelets: 271 K/uL (ref 150–400)
RBC: 3.74 MIL/uL — ABNORMAL LOW (ref 3.87–5.11)
RDW: 14.2 % (ref 11.5–15.5)
WBC: 8 K/uL (ref 4.0–10.5)
nRBC: 0 % (ref 0.0–0.2)

## 2021-07-05 LAB — IRON AND TIBC
Iron: 122 ug/dL (ref 28–170)
Saturation Ratios: 44 % — ABNORMAL HIGH (ref 10.4–31.8)
TIBC: 277 ug/dL (ref 250–450)
UIBC: 155 ug/dL

## 2021-07-05 LAB — FERRITIN: Ferritin: 82 ng/mL (ref 11–307)

## 2021-07-12 ENCOUNTER — Inpatient Hospital Stay: Payer: Medicare HMO | Attending: Nurse Practitioner | Admitting: Nurse Practitioner

## 2021-07-12 ENCOUNTER — Encounter: Payer: Self-pay | Admitting: Nurse Practitioner

## 2021-07-12 ENCOUNTER — Other Ambulatory Visit: Payer: Self-pay

## 2021-07-12 VITALS — BP 103/54 | HR 72 | Temp 98.6°F | Wt 100.9 lb

## 2021-07-12 DIAGNOSIS — Z79899 Other long term (current) drug therapy: Secondary | ICD-10-CM | POA: Insufficient documentation

## 2021-07-12 DIAGNOSIS — N184 Chronic kidney disease, stage 4 (severe): Secondary | ICD-10-CM | POA: Insufficient documentation

## 2021-07-12 DIAGNOSIS — D509 Iron deficiency anemia, unspecified: Secondary | ICD-10-CM | POA: Diagnosis not present

## 2021-07-12 DIAGNOSIS — D631 Anemia in chronic kidney disease: Secondary | ICD-10-CM

## 2021-07-12 DIAGNOSIS — I129 Hypertensive chronic kidney disease with stage 1 through stage 4 chronic kidney disease, or unspecified chronic kidney disease: Secondary | ICD-10-CM | POA: Diagnosis not present

## 2021-07-12 NOTE — Progress Notes (Signed)
[place]  [phone] [phone]  ID: Ellene Route OB: [birth year]  MR#: [id number]  AOZ#:[id number]  Patient Care Team: Tracie Harrier, MD as PCP - General (Internal Medicine) Lloyd Huger, MD as Consulting Physician (Hematology and Oncology)  CHIEF COMPLAINT: Iron deficiency anemia  INTERVAL HISTORY: Patient returns to clinic today for repeat laboratory work, further evaluation, and consideration of additional IV Venofer if needed.  She continues to have chronic weakness and fatigue, but otherwise feels well. She had abnormal mammogram and is awaiting breast biopsy which has her worried. She has no neurologic complaints. She denies any recent fevers or illnesses. She has a good appetite and denies weight loss.  She has no chest pain, shortness of breath, cough, or hemoptysis.  She denies any nausea, vomiting, constipation, or diarrhea.  She has no melena or hematochezia.  She has no urinary complaints.  Patient offers no further specific complaints today.  REVIEW OF SYSTEMS:   Review of Systems  Constitutional:  Positive for malaise/fatigue. Negative for fever and weight loss.  Respiratory:  Negative for cough, hemoptysis and shortness of breath.   Cardiovascular:  Negative for chest pain and leg swelling.  Gastrointestinal:  Negative for abdominal pain, blood in stool, melena, nausea and vomiting.  Genitourinary:  Negative for dysuria and hematuria.  Musculoskeletal:  Negative for back pain and falls.  Skin:  Negative for rash.  Neurological:  Negative for dizziness, focal weakness, weakness and headaches.  Endo/Heme/Allergies:  Does not bruise/bleed easily.  Psychiatric/Behavioral:  Negative for depression. The patient is not nervous/anxious.    As per HPI. Otherwise, a complete review of systems is negative.  PAST MEDICAL HISTORY: Past Medical History:  Diagnosis Date   Acid reflux    Anemia    CAD (coronary artery  disease)    Chronic constipation    Chronic kidney disease    stage 3 chronic kidney disease   Eczema    H/O heart artery stent    Hyperlipidemia    Hypertension    IBS (irritable bowel syndrome) 2004   Migraine    Mitral valve prolapse    Peptic ulcer 1989   Skin cancer     PAST SURGICAL HISTORY: Past Surgical History:  Procedure Laterality Date   ABDOMINAL HYSTERECTOMY     APPENDECTOMY     CARDIAC CATHETERIZATION N/A 10/21/2015   Procedure: Left Heart Cath and Coronary Angiography;  Surgeon: Isaias Cowman, MD;  Location: [facility];  Service: Cardiovascular;  Laterality: N/A;   CARDIAC CATHETERIZATION N/A 10/21/2015   Procedure: Coronary Stent Intervention;  Surgeon: Isaias Cowman, MD;  Location: [facility];  Service: Cardiovascular;  Laterality: N/A;   CARDIAC CATHETERIZATION Left 11/24/2015   Procedure: Coronary Stent Intervention;  Surgeon: Isaias Cowman, MD;  Location: [facility];  Service: Cardiovascular;  Laterality: Left;   CARDIAC SURGERY  2007   cardiac stent place   CATARACT EXTRACTION  2012   CHOLECYSTECTOMY     CORONARY ANGIOPLASTY WITH STENT PLACEMENT  2013   CORONARY ARTERY BYPASS GRAFT  2008   CORONARY STENT INTERVENTION N/A 07/04/2017   Procedure: CORONARY STENT INTERVENTION;  Surgeon: Isaias Cowman, MD;  Location: [facility];  Service: Cardiovascular;  Laterality: N/A;   CORONARY STENT INTERVENTION N/A 08/08/2017   Procedure: CORONARY STENT INTERVENTION;  Surgeon: Isaias Cowman, MD;  Location: [facility];  Service: Cardiovascular;  Laterality: N/A;   CYSTOSCOPY W/ RETROGRADES Bilateral 12/29/2014   Procedure: CYSTOSCOPY WITH  RETROGRADE PYELOGRAM;  Surgeon: Collier Flowers, MD;  Location: [facility];  Service: Urology;  Laterality: Bilateral;   CYSTOSCOPY WITH BIOPSY N/A 12/29/2014   Procedure: CYSTOSCOPY WITH BIOPSY;  Surgeon: Collier Flowers, MD;  Location: [facility];  Service: Urology;   Laterality: N/A;   HAND SURGERY  1998   LEFT HEART CATH AND CORONARY ANGIOGRAPHY Left 08/08/2017   Procedure: LEFT HEART CATH AND CORONARY ANGIOGRAPHY;  Surgeon: Isaias Cowman, MD;  Location: [facility];  Service: Cardiovascular;  Laterality: Left;   LEFT HEART CATH AND CORONARY ANGIOGRAPHY Left 08/01/2019   Procedure: LEFT HEART CATH AND CORONARY ANGIOGRAPHY;  Surgeon: Isaias Cowman, MD;  Location: [facility];  Service: Cardiovascular;  Laterality: Left;   LEFT HEART CATH AND CORS/GRAFTS ANGIOGRAPHY N/A 07/04/2017   Procedure: LEFT HEART CATH AND CORS/GRAFTS ANGIOGRAPHY;  Surgeon: Isaias Cowman, MD;  Location: [facility];  Service: Cardiovascular;  Laterality: N/A;   SIGMOID RESECTION / RECTOPEXY  2006   SKIN CANCER EXCISION  2013   TENDON REPAIR     right elbow   TRIGGER FINGER RELEASE      FAMILY HISTORY: Family History  Problem Relation Age of Onset   Kidney Stones Father    Prostate cancer Father    Heart disease Father    Kidney Stones Brother    Heart disease Mother     ADVANCED DIRECTIVES (Y/N):  N  HEALTH MAINTENANCE: Social History   Tobacco Use   Smoking status: Never    Passive exposure: Never   Smokeless tobacco: Never  Vaping Use   Vaping Use: Never used  Substance Use Topics   Alcohol use: No   Drug use: No     Colonoscopy:  PAP:  Bone density:  Lipid panel:  Allergies  Allergen Reactions   Prochlorperazine Nausea And Vomiting, Other (See Comments) and Nausea Only    Severe vomiting   Ezetimibe Other (See Comments)    Muscle Pain   Statins Other (See Comments)    Muscle Pain   Tape Other (See Comments)    Pulls my skin off when the tape is removed. Use paper tape only.   Cephalexin Rash   Metoclopramide Rash and Other (See Comments)    Elevates BP, face draws to one side   Penicillins Rash and Other (See Comments)    Has patient had a PCN reaction causing immediate rash, facial/tongue/throat  swelling, SOB or lightheadedness with hypotension: Yes Has patient had a PCN reaction causing severe rash involving mucus membranes or skin necrosis: Yes Has patient had a PCN reaction that required hospitalization No Has patient had a PCN reaction occurring within the last 10 years: No If all of the above answers are "NO", then may proceed with Cephalosporin use.     Current Outpatient Medications  Medication Sig Dispense Refill   acetaminophen (TYLENOL) 325 MG tablet Take 650 mg by mouth every 6 (six) hours as needed (for pain.).     allopurinol (ZYLOPRIM) 100 MG tablet Take 100 mg by mouth daily with lunch.      aspirin 325 MG tablet Take 325 mg by mouth daily with lunch.      AZO-CRANBERRY PO Take 1 capsule by mouth daily with lunch.      calcitRIOL (ROCALTROL) 0.25 MCG capsule 1 capsule once daily     CANNABIDIOL PO Take 15 mg by mouth daily. CBD Gummies     clopidogrel (PLAVIX) 75 MG tablet Take 75 mg by mouth daily  with lunch.      ergocalciferol (VITAMIN D2) 1.25 MG (50000 UT) capsule Take by mouth.     furosemide (LASIX) 40 MG tablet Take 20 mg by mouth once a week. Every other day with lunch     gabapentin (NEURONTIN) 100 MG capsule Take 100 mg by mouth at bedtime.      hyoscyamine (LEVSIN SL) 0.125 MG SL tablet Take 0.125 mg by mouth daily.      isosorbide mononitrate (IMDUR) 60 MG 24 hr tablet Take 60 mg by mouth daily.      MAGNESIUM CITRATE PO Take 100 mg by mouth daily.      metoprolol succinate (TOPROL-XL) 25 MG 24 hr tablet Take 25 mg by mouth daily with lunch. 1/2 tab QD     nitroGLYCERIN (NITROSTAT) 0.4 MG SL tablet Place 0.4 mg under the tongue every 5 (five) minutes as needed for chest pain.      pantoprazole (PROTONIX) 40 MG tablet Take 40 mg by mouth daily before breakfast.      potassium chloride (MICRO-K) 10 MEQ CR capsule Take 20 mEq by mouth in the morning and at bedtime.      promethazine (PHENERGAN) 25 MG tablet Take 25 mg by mouth every 8 (eight) hours as  needed for nausea or vomiting.      riboflavin (VITAMIN B-2) 100 MG TABS tablet Take 100 mg by mouth daily with lunch.      senna (SENOKOT) 8.6 MG tablet Take 4-6 tablets by mouth at bedtime.      sodium bicarbonate 650 MG tablet Take 650 mg by mouth daily with lunch.     Vitamin D, Ergocalciferol, (DRISDOL) 1.25 MG (50000 UNIT) CAPS capsule Take 50,000 Units by mouth every 7 (seven) days.     vitamin E 400 UNIT capsule Take 400 Units by mouth daily with lunch.      No current facility-administered medications for this visit.    OBJECTIVE: Vitals:   07/12/21 0957  BP: (!) 103/54  Pulse: 72  Temp: 98.6 F (37 C)     Body mass index is 19.71 kg/m.    ECOG FS:0 - Asymptomatic  General: Well-developed, well-nourished, no acute distress. Eyes: Pink conjunctiva, anicteric sclera. HEENT: Normocephalic, moist mucous membranes. Lungs: No audible wheezing or coughing. Heart: Regular rate and rhythm. Abdomen: Soft, nontender, no obvious distention. Musculoskeletal: No edema, cyanosis, or clubbing. Neuro: Alert, answering all questions appropriately. Cranial nerves grossly intact. Skin: No rashes or petechiae noted. Psych: Normal affect.  LAB RESULTS:  Lab Results  Component Value Date   NA 138 09/20/2020   K 3.6 09/20/2020   CL 112 (H) 09/20/2020   CO2 18 (L) 09/20/2020   GLUCOSE 93 09/20/2020   BUN 33 (H) 09/20/2020   CREATININE 2.93 (H) 09/20/2020   CALCIUM 8.3 (L) 09/20/2020   PROT 6.1 (L) 09/20/2020   ALBUMIN 3.6 09/20/2020   AST 16 09/20/2020   ALT 8 09/20/2020   ALKPHOS 63 09/20/2020   BILITOT 0.6 09/20/2020   GFRNONAA 16 (L) 09/20/2020   GFRAA 39 (L) 08/09/2017    Lab Results  Component Value Date   WBC 8.0 07/05/2021   NEUTROABS 5.8 07/05/2021   HGB 11.3 (L) 07/05/2021   HCT 35.6 (L) 07/05/2021   MCV 95.2 07/05/2021   PLT 271 07/05/2021   Lab Results  Component Value Date   IRON 122 07/05/2021   TIBC 277 07/05/2021   IRONPCTSAT 44 (H) 07/05/2021   Lab  Results  Component Value Date  FERRITIN 82 07/05/2021     STUDIES: US BREAST LTD UNI RIGHT INC AXILLA  Result Date: 06/25/2021 CLINICAL DATA:  76 year old female presenting as a recall from screening for possible right breast mass and possible left breast calcifications. EXAM: DIGITAL DIAGNOSTIC BILATERAL MAMMOGRAM WITH TOMOSYNTHESIS AND CAD; ULTRASOUND RIGHT BREAST LIMITED TECHNIQUE: Bilateral digital diagnostic mammography and breast tomosynthesis was performed. The images were evaluated with computer-aided detection.; Targeted ultrasound examination of the right breast was performed COMPARISON:  Previous exam(s). ACR Breast Density Category c: The breast tissue is heterogeneously dense, which may obscure small masses. FINDINGS: Mammogram: Right breast: Spot compression tomosynthesis views of the right breast were performed demonstrating persistence of a 4 mm mass in the medial posterior right breast. Left breast: Spot 2D magnification views of the left breast performed demonstrating persistence of loosely grouped predominantly punctate and round calcifications in the upper outer retroareolar left breast. Overall these span approximately 3.1 cm. Ultrasound: Targeted ultrasound performed throughout the medial aspect of the right breast demonstrating no cystic or solid mass to correspond to the mammographic finding. IMPRESSION: 1. Indeterminate tiny mass in the medial right breast without sonographic correlate. 2. Indeterminate loosely grouped calcifications in the upper outer retroareolar left breast. RECOMMENDATION: 1.  Stereotactic core needle biopsy of the right breast mass. 2. Stereotactic core needle biopsy of the left breast calcifications. I have discussed the findings and recommendations with the patient. If applicable, a reminder letter will be sent to the patient regarding the next appointment. BI-RADS CATEGORY  4: Suspicious. Electronically Signed   By: Audie Pinto M.D.   On: 06/25/2021  12:04  MM DIAG BREAST TOMO BILATERAL  Result Date: 06/25/2021 CLINICAL DATA:  76 year old female presenting as a recall from screening for possible right breast mass and possible left breast calcifications. EXAM: DIGITAL DIAGNOSTIC BILATERAL MAMMOGRAM WITH TOMOSYNTHESIS AND CAD; ULTRASOUND RIGHT BREAST LIMITED TECHNIQUE: Bilateral digital diagnostic mammography and breast tomosynthesis was performed. The images were evaluated with computer-aided detection.; Targeted ultrasound examination of the right breast was performed COMPARISON:  Previous exam(s). ACR Breast Density Category c: The breast tissue is heterogeneously dense, which may obscure small masses. FINDINGS: Mammogram: Right breast: Spot compression tomosynthesis views of the right breast were performed demonstrating persistence of a 4 mm mass in the medial posterior right breast. Left breast: Spot 2D magnification views of the left breast performed demonstrating persistence of loosely grouped predominantly punctate and round calcifications in the upper outer retroareolar left breast. Overall these span approximately 3.1 cm. Ultrasound: Targeted ultrasound performed throughout the medial aspect of the right breast demonstrating no cystic or solid mass to correspond to the mammographic finding. IMPRESSION: 1. Indeterminate tiny mass in the medial right breast without sonographic correlate. 2. Indeterminate loosely grouped calcifications in the upper outer retroareolar left breast. RECOMMENDATION: 1.  Stereotactic core needle biopsy of the right breast mass. 2. Stereotactic core needle biopsy of the left breast calcifications. I have discussed the findings and recommendations with the patient. If applicable, a reminder letter will be sent to the patient regarding the next appointment. BI-RADS CATEGORY  4: Suspicious. Electronically Signed   By: Audie Pinto M.D.   On: 06/25/2021 12:04  MM 3D SCREEN BREAST BILATERAL  Result Date:  06/14/2021 CLINICAL DATA:  Screening. EXAM: DIGITAL SCREENING BILATERAL MAMMOGRAM WITH TOMOSYNTHESIS AND CAD TECHNIQUE: Bilateral screening digital craniocaudal and mediolateral oblique mammograms were obtained. Bilateral screening digital breast tomosynthesis was performed. The images were evaluated with computer-aided detection. COMPARISON:  Previous exam(s). ACR Breast Density Category c:  The breast tissue is heterogeneously dense, which may obscure small masses. FINDINGS: In the right breast a mass requires further evaluation. In the left breast a group of calcifications requires further evaluation. IMPRESSION: Further evaluation is suggested for possible mass in the right breast. Further evaluation is suggested for possible calcifications in the left breast. RECOMMENDATION: Diagnostic mammogram and possibly ultrasound of both breasts. (Code:[id number]-B-[id number]) The patient will be contacted regarding the findings, and additional imaging will be scheduled. BI-RADS CATEGORY  0: Incomplete. Need additional imaging evaluation and/or prior mammograms for comparison. Electronically Signed   By: Dorise Bullion III M.D.   On: 06/14/2021 11:00   ASSESSMENT: Iron deficiency anemia  PLAN:    1. Iron deficiency anemia: Patient's hemoglobin has improved to 11.3.Ferritin  82, iron studies are normal. She does not require IV iron at this time. She may benefit from starting retacrit in the future if her hemoglobin falls to be low 10. Patient last received 200 mg IV Venofer on August 22, 2019.  Return to clinic in 4 months with repeat laboratory work, further evaluation, and continuation of treatment if needed.    2.  CKD- stage 4. Followed by Dr. Candiss Norse. Chronic. Kidney function had improved to GFR of 18 (previously 15) in January 2023. Patient may benefit from Retacrit in the future if her hemoglobin remains below 10.0.  Continue follow-up with nephrology as scheduled.  3.  Cardiac disease: Continue follow-up with cardiology as  scheduled.    4.  Harriet Pho Release Surgery- s/p surgery in November 2022 for tendinitis.   5. Abnormal mammogram- awaiting breast biopsy on 07/14/21. Follow up based on results.   Disposition: 4 mo- lab (cbc, cmp, ferritin, iron studies). Day to week later see Dr. Grayland Ormond for follow up. Appointment can be virtual if patient prefers.   Patient expressed understanding and was in agreement with this plan. She also understands that She can call clinic at any time with any questions, concerns, or complaints.    Verlon Au, NP   07/12/2021

## 2021-07-12 NOTE — Progress Notes (Signed)
Pt is going Wednesday for a biopsy of her Rt Breast. They found a mass and calcifications in her LT breast. ? ?

## 2021-07-13 ENCOUNTER — Encounter: Payer: Self-pay | Admitting: Oncology

## 2021-07-14 ENCOUNTER — Ambulatory Visit
Admission: RE | Admit: 2021-07-14 | Discharge: 2021-07-14 | Disposition: A | Discharge status: Home / Self Care | Payer: Medicare HMO | Source: Ambulatory Visit | Attending: Internal Medicine | Admitting: Internal Medicine

## 2021-07-14 ENCOUNTER — Other Ambulatory Visit: Payer: Self-pay

## 2021-07-14 DIAGNOSIS — R921 Mammographic calcification found on diagnostic imaging of breast: Secondary | ICD-10-CM | POA: Diagnosis present

## 2021-07-14 DIAGNOSIS — N6310 Unspecified lump in the right breast, unspecified quadrant: Secondary | ICD-10-CM

## 2021-07-14 HISTORY — PX: BREAST BIOPSY: SHX20

## 2021-07-15 LAB — SURGICAL PATHOLOGY

## 2021-10-26 ENCOUNTER — Other Ambulatory Visit: Payer: Self-pay

## 2021-10-26 ENCOUNTER — Other Ambulatory Visit: Payer: Self-pay | Admitting: Internal Medicine

## 2021-10-26 DIAGNOSIS — N63 Unspecified lump in unspecified breast: Secondary | ICD-10-CM

## 2021-10-26 DIAGNOSIS — R921 Mammographic calcification found on diagnostic imaging of breast: Secondary | ICD-10-CM

## 2021-11-16 ENCOUNTER — Inpatient Hospital Stay: Payer: Medicare HMO | Attending: Oncology

## 2021-11-16 DIAGNOSIS — I129 Hypertensive chronic kidney disease with stage 1 through stage 4 chronic kidney disease, or unspecified chronic kidney disease: Secondary | ICD-10-CM | POA: Insufficient documentation

## 2021-11-16 DIAGNOSIS — D509 Iron deficiency anemia, unspecified: Secondary | ICD-10-CM | POA: Insufficient documentation

## 2021-11-16 DIAGNOSIS — Z8249 Family history of ischemic heart disease and other diseases of the circulatory system: Secondary | ICD-10-CM | POA: Diagnosis not present

## 2021-11-16 DIAGNOSIS — R5383 Other fatigue: Secondary | ICD-10-CM | POA: Diagnosis not present

## 2021-11-16 DIAGNOSIS — Z8042 Family history of malignant neoplasm of prostate: Secondary | ICD-10-CM | POA: Diagnosis not present

## 2021-11-16 DIAGNOSIS — N184 Chronic kidney disease, stage 4 (severe): Secondary | ICD-10-CM | POA: Insufficient documentation

## 2021-11-16 DIAGNOSIS — Z841 Family history of disorders of kidney and ureter: Secondary | ICD-10-CM | POA: Insufficient documentation

## 2021-11-16 DIAGNOSIS — Z79899 Other long term (current) drug therapy: Secondary | ICD-10-CM | POA: Insufficient documentation

## 2021-11-16 DIAGNOSIS — I2510 Atherosclerotic heart disease of native coronary artery without angina pectoris: Secondary | ICD-10-CM | POA: Insufficient documentation

## 2021-11-16 DIAGNOSIS — Z888 Allergy status to other drugs, medicaments and biological substances status: Secondary | ICD-10-CM | POA: Diagnosis not present

## 2021-11-16 DIAGNOSIS — Z85828 Personal history of other malignant neoplasm of skin: Secondary | ICD-10-CM | POA: Insufficient documentation

## 2021-11-16 DIAGNOSIS — Z881 Allergy status to other antibiotic agents status: Secondary | ICD-10-CM | POA: Insufficient documentation

## 2021-11-16 DIAGNOSIS — R531 Weakness: Secondary | ICD-10-CM | POA: Diagnosis not present

## 2021-11-16 DIAGNOSIS — R079 Chest pain, unspecified: Secondary | ICD-10-CM | POA: Insufficient documentation

## 2021-11-16 DIAGNOSIS — Z7902 Long term (current) use of antithrombotics/antiplatelets: Secondary | ICD-10-CM | POA: Insufficient documentation

## 2021-11-16 DIAGNOSIS — Z9049 Acquired absence of other specified parts of digestive tract: Secondary | ICD-10-CM | POA: Diagnosis not present

## 2021-11-16 DIAGNOSIS — R0602 Shortness of breath: Secondary | ICD-10-CM | POA: Insufficient documentation

## 2021-11-16 DIAGNOSIS — Z880 Allergy status to penicillin: Secondary | ICD-10-CM | POA: Diagnosis not present

## 2021-11-16 LAB — IRON AND TIBC
Iron: 77 ug/dL (ref 28–170)
Saturation Ratios: 27 % (ref 10.4–31.8)
TIBC: 284 ug/dL (ref 250–450)
UIBC: 207 ug/dL

## 2021-11-16 LAB — CBC WITH DIFFERENTIAL/PLATELET
Abs Immature Granulocytes: 0.05 10*3/uL (ref 0.00–0.07)
Basophils Absolute: 0.1 10*3/uL (ref 0.0–0.1)
Basophils Relative: 2 %
Eosinophils Absolute: 0.8 10*3/uL — ABNORMAL HIGH (ref 0.0–0.5)
Eosinophils Relative: 11 %
HCT: 34.4 % — ABNORMAL LOW (ref 36.0–46.0)
Hemoglobin: 10.8 g/dL — ABNORMAL LOW (ref 12.0–15.0)
Immature Granulocytes: 1 %
Lymphocytes Relative: 20 %
Lymphs Abs: 1.4 10*3/uL (ref 0.7–4.0)
MCH: 29.8 pg (ref 26.0–34.0)
MCHC: 31.4 g/dL (ref 30.0–36.0)
MCV: 95 fL (ref 80.0–100.0)
Monocytes Absolute: 0.5 10*3/uL (ref 0.1–1.0)
Monocytes Relative: 7 %
Neutro Abs: 4.3 10*3/uL (ref 1.7–7.7)
Neutrophils Relative %: 59 %
Platelets: 262 10*3/uL (ref 150–400)
RBC: 3.62 MIL/uL — ABNORMAL LOW (ref 3.87–5.11)
RDW: 14.3 % (ref 11.5–15.5)
WBC: 7.2 10*3/uL (ref 4.0–10.5)
nRBC: 0 % (ref 0.0–0.2)

## 2021-11-16 LAB — FERRITIN: Ferritin: 84 ng/mL (ref 11–307)

## 2021-11-23 ENCOUNTER — Ambulatory Visit: Payer: Medicare HMO | Admitting: Oncology

## 2021-11-23 ENCOUNTER — Inpatient Hospital Stay (HOSPITAL_BASED_OUTPATIENT_CLINIC_OR_DEPARTMENT_OTHER): Payer: Medicare HMO | Admitting: Oncology

## 2021-11-23 ENCOUNTER — Encounter: Payer: Self-pay | Admitting: Oncology

## 2021-11-23 VITALS — BP 106/65 | HR 70 | Resp 18 | Ht 60.0 in | Wt 102.0 lb

## 2021-11-23 DIAGNOSIS — D509 Iron deficiency anemia, unspecified: Secondary | ICD-10-CM | POA: Diagnosis not present

## 2021-11-23 DIAGNOSIS — I200 Unstable angina: Secondary | ICD-10-CM | POA: Diagnosis not present

## 2021-11-23 DIAGNOSIS — D631 Anemia in chronic kidney disease: Secondary | ICD-10-CM | POA: Diagnosis not present

## 2021-11-23 DIAGNOSIS — N184 Chronic kidney disease, stage 4 (severe): Secondary | ICD-10-CM

## 2021-11-23 NOTE — Progress Notes (Signed)
[place]  [phone] [phone]  ID: [name] OB: 1946/01/09  MR#: [id number]  XQJ#:[id number]  Patient Care Team: Tracie Harrier, MD as PCP - General (Internal Medicine) Lloyd Huger, MD as Consulting Physician (Hematology and Oncology)  CHIEF COMPLAINT: Iron deficiency anemia  INTERVAL HISTORY: Patient returns to clinic today for repeat laboratory work, further evaluation, and consideration of additional IV Venofer if needed.  Last received IV iron back in 2021.  Has been experiencing more chest pain over the past 1 to 2 months.  Followed by cardiology and has been taking sublingual nitroglycerin which has helped.  Several of her medications have been adjusted recently which do not seem to be helping her pain.  Was hoping for a cardiac cath but unfortunately kidney function is too poor.  Has shortness of breath with exertion and general fatigue thought to be secondary to medications.  REVIEW OF SYSTEMS:   Review of Systems  Constitutional:  Positive for malaise/fatigue. Negative for chills, fever and weight loss.  HENT:  Negative for congestion, ear pain and tinnitus.   Eyes: Negative.  Negative for blurred vision and double vision.  Respiratory: Negative.  Negative for cough, sputum production and shortness of breath.   Cardiovascular:  Positive for chest pain and palpitations. Negative for leg swelling.  Gastrointestinal: Negative.  Negative for abdominal pain, constipation, diarrhea, nausea and vomiting.  Genitourinary:  Negative for dysuria, frequency and urgency.  Musculoskeletal:  Negative for back pain and falls.  Skin: Negative.  Negative for rash.  Neurological:  Positive for weakness. Negative for headaches.  Endo/Heme/Allergies: Negative.  Does not bruise/bleed easily.  Psychiatric/Behavioral: Negative.  Negative for depression. The patient is not nervous/anxious and does not have insomnia.     As per HPI.  Otherwise, a complete review of systems is negative.  PAST MEDICAL HISTORY: Past Medical History:  Diagnosis Date   Acid reflux    Anemia    CAD (coronary artery disease)    Chronic constipation    Chronic kidney disease    stage 3 chronic kidney disease   Eczema    H/O heart artery stent    Hyperlipidemia    Hypertension    IBS (irritable bowel syndrome) 2004   Migraine    Mitral valve prolapse    Peptic ulcer 1989   Skin cancer     PAST SURGICAL HISTORY: Past Surgical History:  Procedure Laterality Date   ABDOMINAL HYSTERECTOMY     APPENDECTOMY     BREAST BIOPSY Left 07/14/2021   left stereo bx ribbon clip path pending   CARDIAC CATHETERIZATION N/A 10/21/2015   Procedure: Left Heart Cath and Coronary Angiography;  Surgeon: Isaias Cowman, MD;  Location: [facility];  Service: Cardiovascular;  Laterality: N/A;   CARDIAC CATHETERIZATION N/A 10/21/2015   Procedure: Coronary Stent Intervention;  Surgeon: Isaias Cowman, MD;  Location: [facility];  Service: Cardiovascular;  Laterality: N/A;   CARDIAC CATHETERIZATION Left 11/24/2015   Procedure: Coronary Stent Intervention;  Surgeon: Isaias Cowman, MD;  Location: [facility];  Service: Cardiovascular;  Laterality: Left;   CARDIAC SURGERY  2007   cardiac stent place   CATARACT EXTRACTION  2012   CHOLECYSTECTOMY     CORONARY ANGIOPLASTY WITH STENT PLACEMENT  2013   CORONARY ARTERY BYPASS GRAFT  2008   CORONARY STENT INTERVENTION N/A 07/04/2017   Procedure: CORONARY STENT INTERVENTION;  Surgeon: Isaias Cowman, MD;  Location: [facility] [facility];  Service: Cardiovascular;  Laterality: N/A;   CORONARY STENT INTERVENTION N/A 08/08/2017   Procedure: CORONARY STENT INTERVENTION;  Surgeon: Isaias Cowman, MD;  Location: [facility];  Service: Cardiovascular;  Laterality: N/A;   CYSTOSCOPY W/ RETROGRADES Bilateral 12/29/2014   Procedure: CYSTOSCOPY WITH RETROGRADE  PYELOGRAM;  Surgeon: Collier Flowers, MD;  Location: [facility];  Service: Urology;  Laterality: Bilateral;   CYSTOSCOPY WITH BIOPSY N/A 12/29/2014   Procedure: CYSTOSCOPY WITH BIOPSY;  Surgeon: Collier Flowers, MD;  Location: [facility];  Service: Urology;  Laterality: N/A;   HAND SURGERY  1998   LEFT HEART CATH AND CORONARY ANGIOGRAPHY Left 08/08/2017   Procedure: LEFT HEART CATH AND CORONARY ANGIOGRAPHY;  Surgeon: Isaias Cowman, MD;  Location: [facility];  Service: Cardiovascular;  Laterality: Left;   LEFT HEART CATH AND CORONARY ANGIOGRAPHY Left 08/01/2019   Procedure: LEFT HEART CATH AND CORONARY ANGIOGRAPHY;  Surgeon: Isaias Cowman, MD;  Location: [facility] [facility];  Service: Cardiovascular;  Laterality: Left;   LEFT HEART CATH AND CORS/GRAFTS ANGIOGRAPHY N/A 07/04/2017   Procedure: LEFT HEART CATH AND CORS/GRAFTS ANGIOGRAPHY;  Surgeon: Isaias Cowman, MD;  Location: [place] [facility];  Service: Cardiovascular;  Laterality: N/A;   SIGMOID RESECTION / RECTOPEXY  2006   SKIN CANCER EXCISION  2013   TENDON REPAIR     right elbow   TRIGGER FINGER RELEASE      FAMILY HISTORY: Family History  Problem Relation Age of Onset   Kidney Stones Father    Prostate cancer Father    Heart disease Father    Kidney Stones Brother    Heart disease Mother     ADVANCED DIRECTIVES (Y/N):  N  HEALTH MAINTENANCE: Social History   Tobacco Use   Smoking status: Never    Passive exposure: Never   Smokeless tobacco: Never  Vaping Use   Vaping Use: Never used  Substance Use Topics   Alcohol use: No   Drug use: No     Colonoscopy:  PAP:  Bone density:  Lipid panel:  Allergies  Allergen Reactions   Prochlorperazine Nausea And Vomiting, Other (See Comments) and Nausea Only    Severe vomiting   Ezetimibe Other (See Comments)    Muscle Pain   Statins Other (See Comments)    Muscle Pain   Tape Other (See Comments)    Pulls my skin off when the tape is  removed. Use paper tape only.   Cephalexin Rash   Metoclopramide Rash and Other (See Comments)    Elevates BP, face draws to one side   Penicillins Rash and Other (See Comments)    Has patient had a PCN reaction causing immediate rash, facial/tongue/throat swelling, SOB or lightheadedness with hypotension: Yes Has patient had a PCN reaction causing severe rash involving mucus membranes or skin necrosis: Yes Has patient had a PCN reaction that required hospitalization No Has patient had a PCN reaction occurring within the last 10 years: No If all of the above answers are "NO", then may proceed with Cephalosporin use.     Current Outpatient Medications  Medication Sig Dispense Refill   acetaminophen (TYLENOL) 325 MG tablet Take 650 mg by mouth every 6 (six) hours as needed (for pain.).     allopurinol (ZYLOPRIM) 100 MG tablet Take 100 mg by mouth daily with lunch.      aspirin 325 MG tablet Take 325 mg by mouth daily with lunch.      AZO-CRANBERRY PO Take 1 capsule by mouth daily with  lunch.      calcitRIOL (ROCALTROL) 0.25 MCG capsule 1 capsule once daily     ciclopirox (PENLAC) 8 % solution Apply topically.     clopidogrel (PLAVIX) 75 MG tablet Take 75 mg by mouth daily with lunch.      ergocalciferol (VITAMIN D2) 1.25 MG (50000 UT) capsule Take by mouth.     furosemide (LASIX) 40 MG tablet Take 20 mg by mouth once a week. Every other day with lunch     gabapentin (NEURONTIN) 100 MG capsule Take 100 mg by mouth at bedtime.      hyoscyamine (LEVSIN SL) 0.125 MG SL tablet Take 0.125 mg by mouth daily.      isosorbide mononitrate (IMDUR) 60 MG 24 hr tablet Take 60 mg by mouth daily.      MAGNESIUM CITRATE PO Take 100 mg by mouth daily.      metoprolol succinate (TOPROL-XL) 25 MG 24 hr tablet Take 25 mg by mouth daily with lunch. 1/2 tab QD     pantoprazole (PROTONIX) 40 MG tablet Take 40 mg by mouth daily before breakfast.      potassium chloride (MICRO-K) 10 MEQ CR capsule Take 20 mEq by  mouth in the morning and at bedtime.      promethazine (PHENERGAN) 25 MG tablet Take 25 mg by mouth every 8 (eight) hours as needed for nausea or vomiting.      riboflavin (VITAMIN B-2) 100 MG TABS tablet Take 100 mg by mouth daily with lunch.      senna (SENOKOT) 8.6 MG tablet Take 4-6 tablets by mouth at bedtime.      sodium bicarbonate 650 MG tablet Take 650 mg by mouth daily with lunch.     Vitamin D, Ergocalciferol, (DRISDOL) 1.25 MG (50000 UNIT) CAPS capsule Take 50,000 Units by mouth every 7 (seven) days.     vitamin E 400 UNIT capsule Take 400 Units by mouth daily with lunch.      ALPRAZolam (XANAX) 0.25 MG tablet Take 0.25 mg by mouth 2 (two) times daily as needed.     CANNABIDIOL PO Take 15 mg by mouth daily. CBD Gummies (Patient not taking: Reported on 11/23/2021)     icosapent Ethyl (VASCEPA) 1 g capsule Take by mouth.     nitroGLYCERIN (NITROSTAT) 0.4 MG SL tablet Place 0.4 mg under the tongue every 5 (five) minutes as needed for chest pain.  (Patient not taking: Reported on 11/23/2021)     No current facility-administered medications for this visit.    OBJECTIVE: There were no vitals filed for this visit.    There is no height or weight on file to calculate BMI.    ECOG FS:0 - Asymptomatic  Physical Exam Constitutional:      Appearance: Normal appearance.  HENT:     Head: Normocephalic and atraumatic.  Eyes:     Pupils: Pupils are equal, round, and reactive to light.  Cardiovascular:     Rate and Rhythm: Normal rate and regular rhythm.     Heart sounds: Normal heart sounds. No murmur heard. Pulmonary:     Effort: Pulmonary effort is normal.     Breath sounds: Normal breath sounds. No wheezing.  Abdominal:     General: Bowel sounds are normal. There is no distension.     Palpations: Abdomen is soft.     Tenderness: There is no abdominal tenderness.  Musculoskeletal:        General: Normal range of motion.     Cervical back:  Normal range of motion.  Skin:    General:  Skin is warm and dry.     Findings: No rash.  Neurological:     Mental Status: She is alert and oriented to person, place, and time.  Psychiatric:        Judgment: Judgment normal.     LAB RESULTS:  Lab Results  Component Value Date   NA 138 09/20/2020   K 3.6 09/20/2020   CL 112 (H) 09/20/2020   CO2 18 (L) 09/20/2020   GLUCOSE 93 09/20/2020   BUN 33 (H) 09/20/2020   CREATININE 2.93 (H) 09/20/2020   CALCIUM 8.3 (L) 09/20/2020   PROT 6.1 (L) 09/20/2020   ALBUMIN 3.6 09/20/2020   AST 16 09/20/2020   ALT 8 09/20/2020   ALKPHOS 63 09/20/2020   BILITOT 0.6 09/20/2020   GFRNONAA 16 (L) 09/20/2020   GFRAA 39 (L) 08/09/2017    Lab Results  Component Value Date   WBC 7.2 11/16/2021   NEUTROABS 4.3 11/16/2021   HGB 10.8 (L) 11/16/2021   HCT 34.4 (L) 11/16/2021   MCV 95.0 11/16/2021   PLT 262 11/16/2021   Lab Results  Component Value Date   IRON 77 11/16/2021   TIBC 284 11/16/2021   IRONPCTSAT 27 11/16/2021   Lab Results  Component Value Date   FERRITIN 84 11/16/2021     STUDIES: No results found.  ASSESSMENT: Iron deficiency anemia  PLAN:    1. Iron deficiency anemia- Last received IV iron back in 2021.  Labs from today reviewed with patient.  Ferritin 84 and iron saturation 27%.  Hemoglobin has dropped to 10.8 with MCV 95.0.  Differential shows eosinophilia but otherwise normal.  Anemia likely secondary to CKD and less related to iron deficiency.  No additional iron needed today.  Recommend preauthorization for Retacrit to be used in future.  Has an appointment to see Dr. Candiss Norse at the end of the month.  2.  Chest pains- Evaluated by PCP Dr. Ginette Pitman yesterday.  Pain relieved with sublingual nitroglycerin.  Has history of CAD with 80% stenosis in mid RCA and 70 % stenosis proximal RCA, 4 stents and triple bypass surgery.  Followed by cardiology whom she saw this morning.  Unfortunately, her GFR is too low for cardiac cath.  Recently have increased Imdur from 1 tablet  to 2 which has not helped her pain.  Has used 45 nitroglycerin since June 1.  2.  CKD- stage 4.  Followed by Dr. Candiss Norse.  Sees him at the end of the month.  We will discuss EPO treatment should she need it in the future.  5. Abnormal mammogram-had breast biopsy which was negative.  Continue annual mammograms.  Disposition: Return to clinic in 4 months with follow-up with Dr. Grayland Ormond and lab work.  I spent 25 minutes dedicated to the care of this patient (face-to-face and non-face-to-face) on the date of the encounter to include what is described in the assessment and plan.   Patient expressed understanding and was in agreement with this plan. She also understands that She can call clinic at any time with any questions, concerns, or complaints.    Jacquelin Hawking, NP   11/23/2021

## 2022-01-04 ENCOUNTER — Ambulatory Visit: Payer: Medicare HMO

## 2022-01-04 ENCOUNTER — Ambulatory Visit
Admission: RE | Admit: 2022-01-04 | Discharge: 2022-01-04 | Disposition: A | Discharge status: Home / Self Care | Payer: Medicare HMO | Source: Ambulatory Visit | Attending: Internal Medicine | Admitting: Internal Medicine

## 2022-01-04 DIAGNOSIS — N63 Unspecified lump in unspecified breast: Secondary | ICD-10-CM | POA: Diagnosis present

## 2022-01-04 DIAGNOSIS — R921 Mammographic calcification found on diagnostic imaging of breast: Secondary | ICD-10-CM | POA: Diagnosis present

## 2022-01-19 ENCOUNTER — Other Ambulatory Visit (INDEPENDENT_AMBULATORY_CARE_PROVIDER_SITE_OTHER): Payer: Self-pay | Admitting: Vascular Surgery

## 2022-01-19 DIAGNOSIS — I6523 Occlusion and stenosis of bilateral carotid arteries: Secondary | ICD-10-CM

## 2022-01-24 ENCOUNTER — Ambulatory Visit (INDEPENDENT_AMBULATORY_CARE_PROVIDER_SITE_OTHER): Payer: Medicare HMO | Admitting: Vascular Surgery

## 2022-01-24 ENCOUNTER — Encounter (INDEPENDENT_AMBULATORY_CARE_PROVIDER_SITE_OTHER): Payer: Self-pay | Admitting: Vascular Surgery

## 2022-01-24 ENCOUNTER — Ambulatory Visit (INDEPENDENT_AMBULATORY_CARE_PROVIDER_SITE_OTHER): Payer: Medicare HMO

## 2022-01-24 VITALS — BP 71/44 | HR 81 | Resp 18 | Ht 60.0 in | Wt 101.0 lb

## 2022-01-24 DIAGNOSIS — I701 Atherosclerosis of renal artery: Secondary | ICD-10-CM

## 2022-01-24 DIAGNOSIS — I6523 Occlusion and stenosis of bilateral carotid arteries: Secondary | ICD-10-CM

## 2022-01-24 DIAGNOSIS — I10 Essential (primary) hypertension: Secondary | ICD-10-CM

## 2022-01-24 DIAGNOSIS — K219 Gastro-esophageal reflux disease without esophagitis: Secondary | ICD-10-CM

## 2022-01-24 DIAGNOSIS — I25118 Atherosclerotic heart disease of native coronary artery with other forms of angina pectoris: Secondary | ICD-10-CM | POA: Diagnosis not present

## 2022-01-24 NOTE — Progress Notes (Signed)
MRN : [id number]  [name] is a 76 y.o. ([birth year]) female who presents with chief complaint of check carotid arteries.  History of Present Illness:   The patient is seen for follow up evaluation of carotid stenosis as well as presumed renal artery stenosis. The carotid stenosis followed by ultrasound.    The patient denies amaurosis fugax. There is no recent history of TIA symptoms or focal motor deficits. There is no prior documented CVA.   The patient is taking enteric-coated aspirin 81 mg daily.   There is no history of migraine headaches. There is no history of seizures.   The patient has a history of coronary artery disease, no recent episodes of angina or shortness of breath. The patient denies PAD or claudication symptoms. There is a history of hyperlipidemia which is being treated with a statin.     Carotid Duplex done today shows 1-39% right internal carotid artery stenosis and 60 to 79% left internal carotid artery stenosis.  No change compared to the previous study.  Current Meds  Medication Sig   acetaminophen (TYLENOL) 325 MG tablet Take 650 mg by mouth every 6 (six) hours as needed (for pain.).   allopurinol (ZYLOPRIM) 100 MG tablet Take 100 mg by mouth daily with lunch.    aspirin 325 MG tablet Take 325 mg by mouth daily with lunch.    AZO-CRANBERRY PO Take 1 capsule by mouth daily with lunch.    calcitRIOL (ROCALTROL) 0.25 MCG capsule 1 capsule once daily   CINNAMON PO Take 1,000 mg by mouth.   clopidogrel (PLAVIX) 75 MG tablet Take 75 mg by mouth daily with lunch.    ergocalciferol (VITAMIN D2) 1.25 MG (50000 UT) capsule Take by mouth.   furosemide (LASIX) 40 MG tablet Take 20 mg by mouth once a week. Every other day with lunch   gabapentin (NEURONTIN) 100 MG capsule Take 100 mg by mouth at bedtime.    Garlic 1287 MG CAPS Take by mouth.   hyoscyamine (LEVSIN SL) 0.125 MG SL tablet Take 0.125 mg by mouth daily.    isosorbide mononitrate (IMDUR)  60 MG 24 hr tablet Take 60 mg by mouth daily.    MAGNESIUM CITRATE PO Take 100 mg by mouth daily.    metoprolol succinate (TOPROL-XL) 25 MG 24 hr tablet Take 25 mg by mouth daily with lunch. 1/2 tab QD   nitroGLYCERIN (NITROSTAT) 0.4 MG SL tablet Place 0.4 mg under the tongue every 5 (five) minutes as needed for chest pain.   pantoprazole (PROTONIX) 40 MG tablet Take 40 mg by mouth daily before breakfast.    potassium chloride (MICRO-K) 10 MEQ CR capsule Take 20 mEq by mouth in the morning and at bedtime.    promethazine (PHENERGAN) 25 MG tablet Take 25 mg by mouth every 8 (eight) hours as needed for nausea or vomiting.    ranolazine (RANEXA) 500 MG 12 hr tablet Take 0.5 tablets by mouth 2 (two) times daily.   riboflavin (VITAMIN B-2) 100 MG TABS tablet Take 100 mg by mouth daily with lunch.    senna (SENOKOT) 8.6 MG tablet Take 4-6 tablets by mouth at bedtime.    sodium bicarbonate 650 MG tablet Take 650 mg by mouth daily with lunch.   vitamin E 400 UNIT capsule Take 400 Units by mouth daily with lunch.     Past Medical History:  Diagnosis Date   Acid reflux    Anemia    CAD (coronary artery disease)  Chronic constipation    Chronic kidney disease    stage 3 chronic kidney disease   Eczema    H/O heart artery stent    Hyperlipidemia    Hypertension    IBS (irritable bowel syndrome) 2004   Migraine    Mitral valve prolapse    Peptic ulcer 1989   Skin cancer     Past Surgical History:  Procedure Laterality Date   ABDOMINAL HYSTERECTOMY     APPENDECTOMY     BREAST BIOPSY Left 07/14/2021   left stereo bx ribbon clip path pending   CARDIAC CATHETERIZATION N/A 10/21/2015   Procedure: Left Heart Cath and Coronary Angiography;  Surgeon: Isaias Cowman, MD;  Location: [facility];  Service: Cardiovascular;  Laterality: N/A;   CARDIAC CATHETERIZATION N/A 10/21/2015   Procedure: Coronary Stent Intervention;  Surgeon: Isaias Cowman, MD;  Location: [facility];  Service: Cardiovascular;  Laterality: N/A;   CARDIAC CATHETERIZATION Left 11/24/2015   Procedure: Coronary Stent Intervention;  Surgeon: Isaias Cowman, MD;  Location: [facility];  Service: Cardiovascular;  Laterality: Left;   CARDIAC SURGERY  2007   cardiac stent place   CATARACT EXTRACTION  2012   CHOLECYSTECTOMY     CORONARY ANGIOPLASTY WITH STENT PLACEMENT  2013   CORONARY ARTERY BYPASS GRAFT  2008   CORONARY STENT INTERVENTION N/A 07/04/2017   Procedure: CORONARY STENT INTERVENTION;  Surgeon: Isaias Cowman, MD;  Location: [facility];  Service: Cardiovascular;  Laterality: N/A;   CORONARY STENT INTERVENTION N/A 08/08/2017   Procedure: CORONARY STENT INTERVENTION;  Surgeon: Isaias Cowman, MD;  Location: [facility];  Service: Cardiovascular;  Laterality: N/A;   CYSTOSCOPY W/ RETROGRADES Bilateral 12/29/2014   Procedure: CYSTOSCOPY WITH RETROGRADE PYELOGRAM;  Surgeon: Collier Flowers, MD;  Location: [facility];  Service: Urology;  Laterality: Bilateral;   CYSTOSCOPY WITH BIOPSY N/A 12/29/2014   Procedure: CYSTOSCOPY WITH BIOPSY;  Surgeon: Collier Flowers, MD;  Location: [facility];  Service: Urology;  Laterality: N/A;   HAND SURGERY  1998   LEFT HEART CATH AND CORONARY ANGIOGRAPHY Left 08/08/2017   Procedure: LEFT HEART CATH AND CORONARY ANGIOGRAPHY;  Surgeon: Isaias Cowman, MD;  Location: [facility];  Service: Cardiovascular;  Laterality: Left;   LEFT HEART CATH AND CORONARY ANGIOGRAPHY Left 08/01/2019   Procedure: LEFT HEART CATH AND CORONARY ANGIOGRAPHY;  Surgeon: Isaias Cowman, MD;  Location: [facility];  Service: Cardiovascular;  Laterality: Left;   LEFT HEART CATH AND CORS/GRAFTS ANGIOGRAPHY N/A 07/04/2017   Procedure: LEFT HEART CATH AND CORS/GRAFTS ANGIOGRAPHY;  Surgeon: Isaias Cowman, MD;  Location: [facility];  Service: Cardiovascular;  Laterality: N/A;   SIGMOID RESECTION /  RECTOPEXY  2006   SKIN CANCER EXCISION  2013   TENDON REPAIR     right elbow   TRIGGER FINGER RELEASE      Social History Social History   Tobacco Use   Smoking status: Never    Passive exposure: Never   Smokeless tobacco: Never  Vaping Use   Vaping Use: Never used  Substance Use Topics   Alcohol use: No   Drug use: No    Family History Family History  Problem Relation Age of Onset   Kidney Stones Father    Prostate cancer Father    Heart disease Father    Kidney Stones Brother    Heart disease Mother     Allergies  Allergen Reactions   Prochlorperazine Nausea And Vomiting,  Other (See Comments) and Nausea Only    Severe vomiting   Ezetimibe Other (See Comments)    Muscle Pain   Statins Other (See Comments)    Muscle Pain   Tape Other (See Comments)    Pulls my skin off when the tape is removed. Use paper tape only.   Cephalexin Rash   Metoclopramide Rash and Other (See Comments)    Elevates BP, face draws to one side   Penicillins Rash and Other (See Comments)    Has patient had a PCN reaction causing immediate rash, facial/tongue/throat swelling, SOB or lightheadedness with hypotension: Yes Has patient had a PCN reaction causing severe rash involving mucus membranes or skin necrosis: Yes Has patient had a PCN reaction that required hospitalization No Has patient had a PCN reaction occurring within the last 10 years: No If all of the above answers are "NO", then may proceed with Cephalosporin use.      REVIEW OF SYSTEMS (Negative unless checked)  Constitutional: '[]'$ Weight loss  '[]'$ Fever  '[]'$ Chills Cardiac: '[]'$ Chest pain   '[]'$ Chest pressure   '[]'$ Palpitations   '[]'$ Shortness of breath when laying flat   '[]'$ Shortness of breath with exertion. Vascular:  '[x]'$ Pain in legs with walking   '[]'$ Pain in legs at rest  '[]'$ History of DVT   '[]'$ Phlebitis   '[]'$ Swelling in legs   '[]'$ Varicose veins   '[]'$ Non-healing ulcers Pulmonary:   '[]'$ Uses home oxygen   '[]'$ Productive cough   '[]'$ Hemoptysis    '[]'$ Wheeze  '[]'$ COPD   '[]'$ Asthma Neurologic:  '[]'$ Dizziness   '[]'$ Seizures   '[]'$ History of stroke   '[]'$ History of TIA  '[]'$ Aphasia   '[]'$ Vissual changes   '[]'$ Weakness or numbness in arm   '[]'$ Weakness or numbness in leg Musculoskeletal:   '[]'$ Joint swelling   '[]'$ Joint pain   '[]'$ Low back pain Hematologic:  '[]'$ Easy bruising  '[]'$ Easy bleeding   '[]'$ Hypercoagulable state   '[]'$ Anemic Gastrointestinal:  '[]'$ Diarrhea   '[]'$ Vomiting  '[x]'$ Gastroesophageal reflux/heartburn   '[]'$ Difficulty swallowing. Genitourinary:  '[]'$ Chronic kidney disease   '[]'$ Difficult urination  '[]'$ Frequent urination   '[]'$ Blood in urine Skin:  '[]'$ Rashes   '[]'$ Ulcers  Psychological:  '[]'$ History of anxiety   '[]'$  History of major depression.  Physical Examination  Vitals:   01/24/22 1117  BP: (!) 71/44  Pulse: 81  Resp: 18  Weight: 101 lb (45.8 kg)  Height: 5' (1.524 m)   Body mass index is 19.73 kg/m. Gen: WD/WN, NAD Head: [place]/AT, No temporalis wasting.  Ear/Nose/Throat: Hearing grossly intact, nares w/o erythema or drainage Eyes: PER, EOMI, sclera nonicteric.  Neck: Supple, no masses.  No bruit or JVD.  Pulmonary:  Good air movement, no audible wheezing, no use of accessory muscles.  Cardiac: RRR, normal S1, S2, + Murmurs. Vascular:  carotid bruit noted Vessel Right Left  Radial Palpable Palpable  Carotid  Palpable  Palpable  Subclav  Palpable Palpable  Gastrointestinal: soft, non-distended. No guarding/no peritoneal signs.  Musculoskeletal: M/S 5/5 throughout.  No visible deformity.  Neurologic: CN 2-12 intact. Pain and light touch intact in extremities.  Symmetrical.  Speech is fluent. Motor exam as listed above. Psychiatric: Judgment intact, Mood & affect appropriate for pt's clinical situation. Dermatologic: No rashes or ulcers noted.  No changes consistent with cellulitis.   CBC Lab Results  Component Value Date   WBC 7.2 11/16/2021   HGB 10.8 (L) 11/16/2021   HCT 34.4 (L) 11/16/2021   MCV 95.0 11/16/2021   PLT 262 11/16/2021    BMET     Component Value Date/Time   NA  138 09/20/2020 1049   NA 137 05/24/2013 0357   K 3.6 09/20/2020 1049   K 2.3 (LL) 05/24/2013 0357   CL 112 (H) 09/20/2020 1049   CL 97 (L) 05/24/2013 0357   CO2 18 (L) 09/20/2020 1049   CO2 36 (H) 05/24/2013 0357   GLUCOSE 93 09/20/2020 1049   GLUCOSE 91 05/24/2013 0357   BUN 33 (H) 09/20/2020 1049   BUN 21 (H) 05/24/2013 0357   CREATININE 2.93 (H) 09/20/2020 1049   CREATININE 1.32 (H) 05/24/2013 0357   CALCIUM 8.3 (L) 09/20/2020 1049   CALCIUM 7.9 (L) 05/24/2013 0357   GFRNONAA 16 (L) 09/20/2020 1049   GFRNONAA 42 (L) 05/24/2013 0357   GFRAA 39 (L) 08/09/2017 0409   GFRAA 49 (L) 05/24/2013 0357   CrCl cannot be calculated (Patient's most recent lab result is older than the maximum 21 days allowed.).  COAG Lab Results  Component Value Date   INR 1.5 12/04/2006   INR 0.9 11/30/2006    Radiology MM DIAG BREAST TOMO BILATERAL  Result Date: 01/04/2022 CLINICAL DATA:  Six-month follow-up of right breast mass and a left biopsy site which demonstrated stromal fibrosis. EXAM: DIGITAL DIAGNOSTIC BILATERAL MAMMOGRAM WITH TOMOSYNTHESIS TECHNIQUE: Bilateral digital diagnostic mammography and breast tomosynthesis was performed. COMPARISON:  Previous exam(s). ACR Breast Density Category c: The breast tissue is heterogeneously dense, which may obscure small masses. FINDINGS: The right breast mass has resolved. No change at the biopsy site to suggest malignancy. No other suspicious findings in either breast. IMPRESSION: Stable left biopsy site. Resolution of the right-sided mass. No other suspicious changes in either breast. RECOMMENDATION: Recommend six-month follow-up of the left biopsy site given stromal fibrosis. I have discussed the findings and recommendations with the patient. If applicable, a reminder letter will be sent to the patient regarding the next appointment. BI-RADS CATEGORY  3: Probably benign. Electronically Signed   By: Dorise Bullion III M.D.    On: 01/04/2022 09:45    Assessment/Plan 1. Bilateral carotid artery stenosis Recommend:   Given the patient's asymptomatic subcritical stenosis no further invasive testing or surgery at this time.   Duplex ultrasound shows 40 to 59% stenosis of the right internal carotid artery and 60 to 79% stenosis of the left internal carotid artery.   Continue antiplatelet therapy as prescribed Continue management of CAD, HTN and Hyperlipidemia Healthy heart diet,  encouraged exercise at least 4 times per week Follow up in 12 months with duplex ultrasound and physical exam.   - VAS US CAROTID; Future  2. Renal artery stenosis (HCC) Given patient's arterial disease optimal control of the patient's hypertension is important.  BP is acceptable today and the fact of the matter is she has relatively low blood pressure and once again does not appear to have hypertension SBP of 96 today.   The patient's vital signs and noninvasive studies support the renal arteries are not significantly affected by plaque.   No invasive studies or intervention is indicated at this time.   The patient will continue the current medications, no changes at this time.   The primary medical service will continue aggressive antihypertensive therapy as per the AHA guidelines   3. Primary hypertension Continue antihypertensive medications as already ordered, these medications have been reviewed and there are no changes at this time.   4. Coronary artery disease of native artery of native heart with stable angina pectoris (HCC) Continue cardiac and antihypertensive medications as already ordered and reviewed, no changes at this time.  Continue statin as ordered and reviewed, no changes at this time  Nitrates PRN for chest pain   5. Gastro-esophageal reflux disease without esophagitis Continue PPI as already ordered, this medication has been reviewed and there are no changes at this time.  Avoidence of caffeine and  alcohol  Moderate elevation of the head of the bed      Hortencia Pilar, MD  01/24/2022 12:02 PM

## 2022-03-07 ENCOUNTER — Encounter (INDEPENDENT_AMBULATORY_CARE_PROVIDER_SITE_OTHER): Payer: Self-pay

## 2022-03-29 ENCOUNTER — Inpatient Hospital Stay: Payer: Medicare HMO | Attending: Oncology

## 2022-03-29 ENCOUNTER — Inpatient Hospital Stay: Payer: Medicare HMO | Admitting: Oncology

## 2022-03-29 ENCOUNTER — Encounter: Payer: Self-pay | Admitting: Oncology

## 2022-03-29 VITALS — BP 105/68 | HR 85 | Temp 99.0°F | Resp 16 | Wt 104.7 lb

## 2022-03-29 DIAGNOSIS — Z8042 Family history of malignant neoplasm of prostate: Secondary | ICD-10-CM | POA: Insufficient documentation

## 2022-03-29 DIAGNOSIS — Z888 Allergy status to other drugs, medicaments and biological substances status: Secondary | ICD-10-CM | POA: Diagnosis not present

## 2022-03-29 DIAGNOSIS — Z881 Allergy status to other antibiotic agents status: Secondary | ICD-10-CM | POA: Insufficient documentation

## 2022-03-29 DIAGNOSIS — D509 Iron deficiency anemia, unspecified: Secondary | ICD-10-CM | POA: Insufficient documentation

## 2022-03-29 DIAGNOSIS — R531 Weakness: Secondary | ICD-10-CM | POA: Insufficient documentation

## 2022-03-29 DIAGNOSIS — Z880 Allergy status to penicillin: Secondary | ICD-10-CM | POA: Diagnosis not present

## 2022-03-29 DIAGNOSIS — Z79899 Other long term (current) drug therapy: Secondary | ICD-10-CM | POA: Diagnosis not present

## 2022-03-29 DIAGNOSIS — I129 Hypertensive chronic kidney disease with stage 1 through stage 4 chronic kidney disease, or unspecified chronic kidney disease: Secondary | ICD-10-CM | POA: Diagnosis not present

## 2022-03-29 DIAGNOSIS — Z7902 Long term (current) use of antithrombotics/antiplatelets: Secondary | ICD-10-CM | POA: Diagnosis not present

## 2022-03-29 DIAGNOSIS — N184 Chronic kidney disease, stage 4 (severe): Secondary | ICD-10-CM

## 2022-03-29 DIAGNOSIS — R5383 Other fatigue: Secondary | ICD-10-CM | POA: Diagnosis not present

## 2022-03-29 DIAGNOSIS — Z8249 Family history of ischemic heart disease and other diseases of the circulatory system: Secondary | ICD-10-CM | POA: Insufficient documentation

## 2022-03-29 DIAGNOSIS — Z841 Family history of disorders of kidney and ureter: Secondary | ICD-10-CM | POA: Insufficient documentation

## 2022-03-29 DIAGNOSIS — Z9049 Acquired absence of other specified parts of digestive tract: Secondary | ICD-10-CM | POA: Diagnosis not present

## 2022-03-29 DIAGNOSIS — Z85828 Personal history of other malignant neoplasm of skin: Secondary | ICD-10-CM | POA: Diagnosis not present

## 2022-03-29 DIAGNOSIS — I25119 Atherosclerotic heart disease of native coronary artery with unspecified angina pectoris: Secondary | ICD-10-CM | POA: Insufficient documentation

## 2022-03-29 DIAGNOSIS — D631 Anemia in chronic kidney disease: Secondary | ICD-10-CM | POA: Diagnosis not present

## 2022-03-29 LAB — CBC WITH DIFFERENTIAL/PLATELET
Abs Immature Granulocytes: 0.06 10*3/uL (ref 0.00–0.07)
Basophils Absolute: 0.1 10*3/uL (ref 0.0–0.1)
Basophils Relative: 1 %
Eosinophils Absolute: 0.6 10*3/uL — ABNORMAL HIGH (ref 0.0–0.5)
Eosinophils Relative: 10 %
HCT: 31.6 % — ABNORMAL LOW (ref 36.0–46.0)
Hemoglobin: 10.1 g/dL — ABNORMAL LOW (ref 12.0–15.0)
Immature Granulocytes: 1 %
Lymphocytes Relative: 21 %
Lymphs Abs: 1.3 10*3/uL (ref 0.7–4.0)
MCH: 29.9 pg (ref 26.0–34.0)
MCHC: 32 g/dL (ref 30.0–36.0)
MCV: 93.5 fL (ref 80.0–100.0)
Monocytes Absolute: 0.5 10*3/uL (ref 0.1–1.0)
Monocytes Relative: 8 %
Neutro Abs: 3.7 10*3/uL (ref 1.7–7.7)
Neutrophils Relative %: 59 %
Platelets: 291 10*3/uL (ref 150–400)
RBC: 3.38 MIL/uL — ABNORMAL LOW (ref 3.87–5.11)
RDW: 14.4 % (ref 11.5–15.5)
WBC: 6.3 10*3/uL (ref 4.0–10.5)
nRBC: 0 % (ref 0.0–0.2)

## 2022-03-29 LAB — IRON AND TIBC
Iron: 74 ug/dL (ref 28–170)
Saturation Ratios: 28 % (ref 10.4–31.8)
TIBC: 265 ug/dL (ref 250–450)
UIBC: 191 ug/dL

## 2022-03-29 LAB — FERRITIN: Ferritin: 93 ng/mL (ref 11–307)

## 2022-03-29 NOTE — Progress Notes (Signed)
[place]  [phone] Fax:(336) [phone]  ID: Ellene Route OB: Jun 12, 1945  MR#: [id number]  GBT#:[id number]  Patient Care Team: Tracie Harrier, MD as PCP - General (Internal Medicine) Lloyd Huger, MD as Consulting Physician (Hematology and Oncology)  CHIEF COMPLAINT: Iron deficiency anemia  INTERVAL HISTORY: Patient returns to clinic today for repeat laboratory work, further evaluation, and consideration of additional IV Venofer.  She continues to have chronic angina, but given her worsening renal function no further cardiac catheterizations can be performed and patient is being medically managed by cardiology.  She continues to have chronic weakness and fatigue. She has no neurologic complaints. She denies any recent fevers or illnesses. She has a good appetite and denies weight loss.  She has no shortness of breath, cough, or hemoptysis.  She denies any nausea, vomiting, constipation, or diarrhea.  She has no melena or hematochezia.  She has no urinary complaints.  Patient offers no further specific complaints today.  REVIEW OF SYSTEMS:   Review of Systems  Constitutional:  Positive for malaise/fatigue. Negative for fever and weight loss.  Respiratory: Negative.  Negative for cough, hemoptysis and shortness of breath.   Cardiovascular:  Positive for chest pain. Negative for leg swelling.  Gastrointestinal: Negative.  Negative for abdominal pain, blood in stool and melena.  Genitourinary: Negative.  Negative for dysuria and hematuria.  Musculoskeletal: Negative.  Negative for back pain.  Skin: Negative.  Negative for rash.  Neurological:  Positive for weakness. Negative for dizziness, focal weakness and headaches.  Psychiatric/Behavioral: Negative.  The patient is not nervous/anxious.     As per HPI. Otherwise, a complete review of systems is negative.  PAST MEDICAL HISTORY: Past Medical History:  Diagnosis Date   Acid reflux     Anemia    CAD (coronary artery disease)    Chronic constipation    Chronic kidney disease    stage 3 chronic kidney disease   Eczema    H/O heart artery stent    Hyperlipidemia    Hypertension    IBS (irritable bowel syndrome) 2004   Migraine    Mitral valve prolapse    Peptic ulcer 1989   Skin cancer     PAST SURGICAL HISTORY: Past Surgical History:  Procedure Laterality Date   ABDOMINAL HYSTERECTOMY     APPENDECTOMY     BREAST BIOPSY Left 07/14/2021   left stereo bx ribbon clip path pending   CARDIAC CATHETERIZATION N/A 10/21/2015   Procedure: Left Heart Cath and Coronary Angiography;  Surgeon: Isaias Cowman, MD;  Location: [place] [facility];  Service: Cardiovascular;  Laterality: N/A;   CARDIAC CATHETERIZATION N/A 10/21/2015   Procedure: Coronary Stent Intervention;  Surgeon: Isaias Cowman, MD;  Location: [place] [facility];  Service: Cardiovascular;  Laterality: N/A;   CARDIAC CATHETERIZATION Left 11/24/2015   Procedure: Coronary Stent Intervention;  Surgeon: Isaias Cowman, MD;  Location: [place] [facility];  Service: Cardiovascular;  Laterality: Left;   CARDIAC SURGERY  2007   cardiac stent place   CATARACT EXTRACTION  2012   CHOLECYSTECTOMY     CORONARY ANGIOPLASTY WITH STENT PLACEMENT  2013   CORONARY ARTERY BYPASS GRAFT  2008   CORONARY STENT INTERVENTION N/A 07/04/2017   Procedure: CORONARY STENT INTERVENTION;  Surgeon: Isaias Cowman, MD;  Location: [place] [facility];  Service: Cardiovascular;  Laterality: N/A;   CORONARY STENT INTERVENTION N/A 08/08/2017   Procedure: CORONARY STENT INTERVENTION;  Surgeon: Isaias Cowman, MD;  Location: [place] [facility];  Service: Cardiovascular;  Laterality: N/A;   CYSTOSCOPY W/ RETROGRADES Bilateral 12/29/2014   Procedure: CYSTOSCOPY WITH RETROGRADE PYELOGRAM;  Surgeon: Collier Flowers, MD;  Location: [facility];  Service: Urology;  Laterality: Bilateral;   CYSTOSCOPY WITH BIOPSY N/A  12/29/2014   Procedure: CYSTOSCOPY WITH BIOPSY;  Surgeon: Collier Flowers, MD;  Location: [facility];  Service: Urology;  Laterality: N/A;   HAND SURGERY  1998   LEFT HEART CATH AND CORONARY ANGIOGRAPHY Left 08/08/2017   Procedure: LEFT HEART CATH AND CORONARY ANGIOGRAPHY;  Surgeon: Isaias Cowman, MD;  Location: [place] [facility];  Service: Cardiovascular;  Laterality: Left;   LEFT HEART CATH AND CORONARY ANGIOGRAPHY Left 08/01/2019   Procedure: LEFT HEART CATH AND CORONARY ANGIOGRAPHY;  Surgeon: Isaias Cowman, MD;  Location: [place] [facility];  Service: Cardiovascular;  Laterality: Left;   LEFT HEART CATH AND CORS/GRAFTS ANGIOGRAPHY N/A 07/04/2017   Procedure: LEFT HEART CATH AND CORS/GRAFTS ANGIOGRAPHY;  Surgeon: Isaias Cowman, MD;  Location: [place] [facility];  Service: Cardiovascular;  Laterality: N/A;   SIGMOID RESECTION / RECTOPEXY  2006   SKIN CANCER EXCISION  2013   TENDON REPAIR     right elbow   TRIGGER FINGER RELEASE      FAMILY HISTORY: Family History  Problem Relation Age of Onset   Kidney Stones Father    Prostate cancer Father    Heart disease Father    Kidney Stones Brother    Heart disease Mother     ADVANCED DIRECTIVES (Y/N):  N  HEALTH MAINTENANCE: Social History   Tobacco Use   Smoking status: Never    Passive exposure: Never   Smokeless tobacco: Never  Vaping Use   Vaping Use: Never used  Substance Use Topics   Alcohol use: No   Drug use: No     Colonoscopy:  PAP:  Bone density:  Lipid panel:  Allergies  Allergen Reactions   Prochlorperazine Nausea And Vomiting, Other (See Comments) and Nausea Only    Severe vomiting   Ezetimibe Other (See Comments)    Muscle Pain   Statins Other (See Comments)    Muscle Pain   Tape Other (See Comments)    Pulls my skin off when the tape is removed. Use paper tape only.   Cephalexin Rash   Metoclopramide Rash and Other (See Comments)    Elevates BP, face draws to one side    Penicillins Rash and Other (See Comments)    Has patient had a PCN reaction causing immediate rash, facial/tongue/throat swelling, SOB or lightheadedness with hypotension: Yes Has patient had a PCN reaction causing severe rash involving mucus membranes or skin necrosis: Yes Has patient had a PCN reaction that required hospitalization No Has patient had a PCN reaction occurring within the last 10 years: No If all of the above answers are "NO", then may proceed with Cephalosporin use.     Current Outpatient Medications  Medication Sig Dispense Refill   acetaminophen (TYLENOL) 325 MG tablet Take 650 mg by mouth every 6 (six) hours as needed (for pain.).     allopurinol (ZYLOPRIM) 100 MG tablet Take 100 mg by mouth daily with lunch.      aspirin 325 MG tablet Take 325 mg by mouth daily with lunch.      AZO-CRANBERRY PO Take 1 capsule by mouth daily with lunch.      calcitRIOL (ROCALTROL) 0.25 MCG capsule 1 capsule once daily     CINNAMON PO Take 1,000 mg by mouth.  clopidogrel (PLAVIX) 75 MG tablet Take 75 mg by mouth daily with lunch.      co-enzyme Q-10 30 MG capsule Take 30 mg by mouth daily.     ergocalciferol (VITAMIN D2) 1.25 MG (50000 UT) capsule Take by mouth.     furosemide (LASIX) 40 MG tablet Take 20 mg by mouth once a week. Every other day with lunch     gabapentin (NEURONTIN) 100 MG capsule Take 100 mg by mouth at bedtime.      Garlic 2725 MG CAPS Take by mouth.     hyoscyamine (LEVSIN SL) 0.125 MG SL tablet Take 0.125 mg by mouth daily.      isosorbide mononitrate (IMDUR) 60 MG 24 hr tablet Take 60 mg by mouth daily.      MAGNESIUM CITRATE PO Take 100 mg by mouth daily.      metoprolol succinate (TOPROL-XL) 25 MG 24 hr tablet Take 25 mg by mouth daily with lunch. 1/2 tab QD     pantoprazole (PROTONIX) 40 MG tablet Take 40 mg by mouth daily before breakfast.      potassium chloride (MICRO-K) 10 MEQ CR capsule Take 20 mEq by mouth in the morning and at bedtime.       promethazine (PHENERGAN) 25 MG tablet Take 25 mg by mouth every 8 (eight) hours as needed for nausea or vomiting.      ranolazine (RANEXA) 500 MG 12 hr tablet Take 0.5 tablets by mouth 2 (two) times daily.     riboflavin (VITAMIN B-2) 100 MG TABS tablet Take 100 mg by mouth daily with lunch.      senna (SENOKOT) 8.6 MG tablet Take 4-6 tablets by mouth at bedtime.      sodium bicarbonate 650 MG tablet Take 650 mg by mouth daily with lunch.     nitroGLYCERIN (NITROSTAT) 0.4 MG SL tablet Place 0.4 mg under the tongue every 5 (five) minutes as needed for chest pain. (Patient not taking: Reported on 03/29/2022)     vitamin E 400 UNIT capsule Take 400 Units by mouth daily with lunch.      No current facility-administered medications for this visit.    OBJECTIVE: Vitals:   03/29/22 1011  BP: 105/68  Pulse: 85  Resp: 16  Temp: 99 F (37.2 C)  SpO2: 99%     Body mass index is 20.45 kg/m.    ECOG FS:0 - Asymptomatic  General: Well-developed, well-nourished, no acute distress. Eyes: Pink conjunctiva, anicteric sclera. HEENT: Normocephalic, moist mucous membranes. Lungs: No audible wheezing or coughing. Heart: Regular rate and rhythm. Abdomen: Soft, nontender, no obvious distention. Musculoskeletal: No edema, cyanosis, or clubbing. Neuro: Alert, answering all questions appropriately. Cranial nerves grossly intact. Skin: No rashes or petechiae noted. Psych: Normal affect.  LAB RESULTS:  Lab Results  Component Value Date   NA 138 09/20/2020   K 3.6 09/20/2020   CL 112 (H) 09/20/2020   CO2 18 (L) 09/20/2020   GLUCOSE 93 09/20/2020   BUN 33 (H) 09/20/2020   CREATININE 2.93 (H) 09/20/2020   CALCIUM 8.3 (L) 09/20/2020   PROT 6.1 (L) 09/20/2020   ALBUMIN 3.6 09/20/2020   AST 16 09/20/2020   ALT 8 09/20/2020   ALKPHOS 63 09/20/2020   BILITOT 0.6 09/20/2020   GFRNONAA 16 (L) 09/20/2020   GFRAA 39 (L) 08/09/2017    Lab Results  Component Value Date   WBC 6.3 03/29/2022    NEUTROABS 3.7 03/29/2022   HGB 10.1 (L) 03/29/2022   HCT 31.6 (  L) 03/29/2022   MCV 93.5 03/29/2022   PLT 291 03/29/2022   Lab Results  Component Value Date   IRON 74 03/29/2022   TIBC 265 03/29/2022   IRONPCTSAT 28 03/29/2022   Lab Results  Component Value Date   FERRITIN 93 03/29/2022     STUDIES: No results found.  ASSESSMENT: Iron deficiency anemia  PLAN:    1. Iron deficiency anemia: Patient's hemoglobin is decreased to 10.1, but essentially stable.  Iron stores continue to be within normal limits. Given her significant cardiac disease, will continue to monitor closely and try to maintain hemoglobin greater than 10.0.  She does not require treatment today, but patient may benefit from Retacrit in the future if her hemoglobin continues to fall below 10.0. Patient last received 200 mg IV Venofer on August 22, 2019.  Return to clinic in 3 months with repeat laboratory, further evaluation, and treatment if needed.   2.  Chronic renal insufficiency: Trending down.  Patient's last GFR was reported that 12.  She reports that she will declined dialysis if recommended.  Continue follow-up with nephrology as scheduled. 3.  Cardiac disease: Patient has chronic angina.  No further cardiac catheterizations are recommended given her worsening renal function.  She reports cardiology is attempting to optimize her treatment medically.  Patient expressed understanding and was in agreement with this plan. She also understands that She can call clinic at any time with any questions, concerns, or complaints.    Lloyd Huger, MD   03/29/2022 12:30 PM

## 2022-03-29 NOTE — Progress Notes (Signed)
Patient feels fatigued. Easily worn out. Dyspnea with exertion. Has angina every day but hasn't had to take nitro in the last 3 months. No visible signs of bleeding. Pt has constipation relieved by laxatives.

## 2022-06-10 ENCOUNTER — Other Ambulatory Visit: Payer: Self-pay | Admitting: Internal Medicine

## 2022-06-10 DIAGNOSIS — R897 Abnormal histological findings in specimens from other organs, systems and tissues: Secondary | ICD-10-CM

## 2022-06-23 ENCOUNTER — Other Ambulatory Visit: Payer: Self-pay | Admitting: Oncology

## 2022-06-29 ENCOUNTER — Inpatient Hospital Stay: Payer: Medicare HMO | Attending: Oncology

## 2022-06-29 DIAGNOSIS — Z8249 Family history of ischemic heart disease and other diseases of the circulatory system: Secondary | ICD-10-CM | POA: Diagnosis not present

## 2022-06-29 DIAGNOSIS — N184 Chronic kidney disease, stage 4 (severe): Secondary | ICD-10-CM | POA: Insufficient documentation

## 2022-06-29 DIAGNOSIS — Z888 Allergy status to other drugs, medicaments and biological substances status: Secondary | ICD-10-CM | POA: Diagnosis not present

## 2022-06-29 DIAGNOSIS — R5383 Other fatigue: Secondary | ICD-10-CM | POA: Diagnosis not present

## 2022-06-29 DIAGNOSIS — Z7902 Long term (current) use of antithrombotics/antiplatelets: Secondary | ICD-10-CM | POA: Insufficient documentation

## 2022-06-29 DIAGNOSIS — Z841 Family history of disorders of kidney and ureter: Secondary | ICD-10-CM | POA: Insufficient documentation

## 2022-06-29 DIAGNOSIS — I25119 Atherosclerotic heart disease of native coronary artery with unspecified angina pectoris: Secondary | ICD-10-CM | POA: Diagnosis not present

## 2022-06-29 DIAGNOSIS — Z880 Allergy status to penicillin: Secondary | ICD-10-CM | POA: Insufficient documentation

## 2022-06-29 DIAGNOSIS — I129 Hypertensive chronic kidney disease with stage 1 through stage 4 chronic kidney disease, or unspecified chronic kidney disease: Secondary | ICD-10-CM | POA: Diagnosis not present

## 2022-06-29 DIAGNOSIS — R531 Weakness: Secondary | ICD-10-CM | POA: Diagnosis not present

## 2022-06-29 DIAGNOSIS — Z8042 Family history of malignant neoplasm of prostate: Secondary | ICD-10-CM | POA: Diagnosis not present

## 2022-06-29 DIAGNOSIS — Z881 Allergy status to other antibiotic agents status: Secondary | ICD-10-CM | POA: Diagnosis not present

## 2022-06-29 DIAGNOSIS — Z85828 Personal history of other malignant neoplasm of skin: Secondary | ICD-10-CM | POA: Diagnosis not present

## 2022-06-29 DIAGNOSIS — D509 Iron deficiency anemia, unspecified: Secondary | ICD-10-CM | POA: Insufficient documentation

## 2022-06-29 DIAGNOSIS — Z9049 Acquired absence of other specified parts of digestive tract: Secondary | ICD-10-CM | POA: Diagnosis not present

## 2022-06-29 DIAGNOSIS — Z79899 Other long term (current) drug therapy: Secondary | ICD-10-CM | POA: Diagnosis not present

## 2022-06-29 LAB — IRON AND TIBC
Iron: 90 ug/dL (ref 28–170)
Saturation Ratios: 33 % — ABNORMAL HIGH (ref 10.4–31.8)
TIBC: 274 ug/dL (ref 250–450)
UIBC: 184 ug/dL

## 2022-06-29 LAB — CBC WITH DIFFERENTIAL/PLATELET
Abs Immature Granulocytes: 0.05 10*3/uL (ref 0.00–0.07)
Basophils Absolute: 0.1 10*3/uL (ref 0.0–0.1)
Basophils Relative: 1 %
Eosinophils Absolute: 0.3 10*3/uL (ref 0.0–0.5)
Eosinophils Relative: 4 %
HCT: 31.5 % — ABNORMAL LOW (ref 36.0–46.0)
Hemoglobin: 10.1 g/dL — ABNORMAL LOW (ref 12.0–15.0)
Immature Granulocytes: 1 %
Lymphocytes Relative: 22 %
Lymphs Abs: 1.6 10*3/uL (ref 0.7–4.0)
MCH: 29.6 pg (ref 26.0–34.0)
MCHC: 32.1 g/dL (ref 30.0–36.0)
MCV: 92.4 fL (ref 80.0–100.0)
Monocytes Absolute: 0.6 10*3/uL (ref 0.1–1.0)
Monocytes Relative: 9 %
Neutro Abs: 4.5 10*3/uL (ref 1.7–7.7)
Neutrophils Relative %: 63 %
Platelets: 319 10*3/uL (ref 150–400)
RBC: 3.41 MIL/uL — ABNORMAL LOW (ref 3.87–5.11)
RDW: 14.4 % (ref 11.5–15.5)
WBC: 7.1 10*3/uL (ref 4.0–10.5)
nRBC: 0 % (ref 0.0–0.2)

## 2022-06-29 LAB — FERRITIN: Ferritin: 68 ng/mL (ref 11–307)

## 2022-06-29 MED FILL — Iron Sucrose Inj 20 MG/ML (Fe Equiv): INTRAVENOUS | Qty: 10 | Status: AC

## 2022-06-30 ENCOUNTER — Encounter: Payer: Self-pay | Admitting: Oncology

## 2022-06-30 ENCOUNTER — Inpatient Hospital Stay (HOSPITAL_BASED_OUTPATIENT_CLINIC_OR_DEPARTMENT_OTHER): Payer: Medicare HMO | Admitting: Oncology

## 2022-06-30 ENCOUNTER — Inpatient Hospital Stay: Payer: Medicare HMO

## 2022-06-30 VITALS — BP 114/66 | HR 82 | Temp 96.9°F | Resp 16 | Ht 60.0 in | Wt 102.6 lb

## 2022-06-30 DIAGNOSIS — D631 Anemia in chronic kidney disease: Secondary | ICD-10-CM

## 2022-06-30 DIAGNOSIS — N184 Chronic kidney disease, stage 4 (severe): Secondary | ICD-10-CM

## 2022-06-30 DIAGNOSIS — D509 Iron deficiency anemia, unspecified: Secondary | ICD-10-CM | POA: Diagnosis not present

## 2022-06-30 NOTE — Progress Notes (Signed)
[place]  Telephone:(336) [phone] Fax:(336) [phone]  ID: [name] OB: 12-09-1945  MR#: [id number]  [id number]  Patient Care Team: Tracie Harrier, MD as PCP - General (Internal Medicine) Lloyd Huger, MD as Consulting Physician (Hematology and Oncology)  CHIEF COMPLAINT: Anemia secondary to chronic renal insufficiency.  INTERVAL HISTORY: Patient returns to clinic today for repeat laboratory work, further evaluation, and consideration of Retacrit.  Her cardiac status and angina have improved with medical management.  She continues to have chronic weakness and fatigue. She has no neurologic complaints. She denies any recent fevers or illnesses. She has a good appetite and denies weight loss.  She has no shortness of breath, cough, or hemoptysis.  She denies any nausea, vomiting, constipation, or diarrhea.  She has no melena or hematochezia.  She has no urinary complaints.  Patient offers no further specific complaints today.  REVIEW OF SYSTEMS:   Review of Systems  Constitutional:  Positive for malaise/fatigue. Negative for fever and weight loss.  Respiratory: Negative.  Negative for cough, hemoptysis and shortness of breath.   Cardiovascular:  Positive for chest pain. Negative for leg swelling.  Gastrointestinal: Negative.  Negative for abdominal pain, blood in stool and melena.  Genitourinary: Negative.  Negative for dysuria and hematuria.  Musculoskeletal: Negative.  Negative for back pain.  Skin: Negative.  Negative for rash.  Neurological:  Positive for weakness. Negative for dizziness, focal weakness and headaches.  Psychiatric/Behavioral: Negative.  The patient is not nervous/anxious.     As per HPI. Otherwise, a complete review of systems is negative.  PAST MEDICAL HISTORY: Past Medical History:  Diagnosis Date   Acid reflux    Anemia    CAD (coronary artery disease)    Chronic constipation    Chronic kidney disease    stage 3  chronic kidney disease   Eczema    H/O heart artery stent    Hyperlipidemia    Hypertension    IBS (irritable bowel syndrome) 2004   Migraine    Mitral valve prolapse    Peptic ulcer 1989   Skin cancer     PAST SURGICAL HISTORY: Past Surgical History:  Procedure Laterality Date   ABDOMINAL HYSTERECTOMY     APPENDECTOMY     BREAST BIOPSY Left 07/14/2021   left stereo bx ribbon clip path pending   CARDIAC CATHETERIZATION N/A 10/21/2015   Procedure: Left Heart Cath and Coronary Angiography;  Surgeon: Isaias Cowman, MD;  Location: [place] [facility];  Service: Cardiovascular;  Laterality: N/A;   CARDIAC CATHETERIZATION N/A 10/21/2015   Procedure: Coronary Stent Intervention;  Surgeon: Isaias Cowman, MD;  Location: [place] [facility];  Service: Cardiovascular;  Laterality: N/A;   CARDIAC CATHETERIZATION Left 11/24/2015   Procedure: Coronary Stent Intervention;  Surgeon: Isaias Cowman, MD;  Location: [place] [facility];  Service: Cardiovascular;  Laterality: Left;   CARDIAC SURGERY  2007   cardiac stent place   CATARACT EXTRACTION  2012   CHOLECYSTECTOMY     CORONARY ANGIOPLASTY WITH STENT PLACEMENT  2013   CORONARY ARTERY BYPASS GRAFT  2008   CORONARY STENT INTERVENTION N/A 07/04/2017   Procedure: CORONARY STENT INTERVENTION;  Surgeon: Isaias Cowman, MD;  Location: [place] [facility];  Service: Cardiovascular;  Laterality: N/A;   CORONARY STENT INTERVENTION N/A 08/08/2017   Procedure: CORONARY STENT INTERVENTION;  Surgeon: Isaias Cowman, MD;  Location: [place] [facility];  Service: Cardiovascular;  Laterality: N/A;   CYSTOSCOPY W/ RETROGRADES Bilateral 12/29/2014   Procedure:  CYSTOSCOPY WITH RETROGRADE PYELOGRAM;  Surgeon: Collier Flowers, MD;  Location: [facility];  Service: Urology;  Laterality: Bilateral;   CYSTOSCOPY WITH BIOPSY N/A 12/29/2014   Procedure: CYSTOSCOPY WITH BIOPSY;  Surgeon: Collier Flowers, MD;  Location: [facility];  Service:  Urology;  Laterality: N/A;   HAND SURGERY  1998   LEFT HEART CATH AND CORONARY ANGIOGRAPHY Left 08/08/2017   Procedure: LEFT HEART CATH AND CORONARY ANGIOGRAPHY;  Surgeon: Isaias Cowman, MD;  Location: [place] [facility];  Service: Cardiovascular;  Laterality: Left;   LEFT HEART CATH AND CORONARY ANGIOGRAPHY Left 08/01/2019   Procedure: LEFT HEART CATH AND CORONARY ANGIOGRAPHY;  Surgeon: Isaias Cowman, MD;  Location: [place] [facility];  Service: Cardiovascular;  Laterality: Left;   LEFT HEART CATH AND CORS/GRAFTS ANGIOGRAPHY N/A 07/04/2017   Procedure: LEFT HEART CATH AND CORS/GRAFTS ANGIOGRAPHY;  Surgeon: Isaias Cowman, MD;  Location: [place] [facility];  Service: Cardiovascular;  Laterality: N/A;   SIGMOID RESECTION / RECTOPEXY  2006   SKIN CANCER EXCISION  2013   TENDON REPAIR     right elbow   TRIGGER FINGER RELEASE      FAMILY HISTORY: Family History  Problem Relation Age of Onset   Kidney Stones Father    Prostate cancer Father    Heart disease Father    Kidney Stones Brother    Heart disease Mother     ADVANCED DIRECTIVES (Y/N):  N  HEALTH MAINTENANCE: Social History   Tobacco Use   Smoking status: Never    Passive exposure: Never   Smokeless tobacco: Never  Vaping Use   Vaping Use: Never used  Substance Use Topics   Alcohol use: No   Drug use: No     Colonoscopy:  PAP:  Bone density:  Lipid panel:  Allergies  Allergen Reactions   Prochlorperazine Nausea And Vomiting, Other (See Comments) and Nausea Only    Severe vomiting   Ezetimibe Other (See Comments)    Muscle Pain   Statins Other (See Comments)    Muscle Pain   Tape Other (See Comments)    Pulls my skin off when the tape is removed. Use paper tape only.   Cephalexin Rash   Metoclopramide Rash and Other (See Comments)    Elevates BP, face draws to one side   Penicillins Rash and Other (See Comments)    Has patient had a PCN reaction causing immediate rash,  facial/tongue/throat swelling, SOB or lightheadedness with hypotension: Yes Has patient had a PCN reaction causing severe rash involving mucus membranes or skin necrosis: Yes Has patient had a PCN reaction that required hospitalization No Has patient had a PCN reaction occurring within the last 10 years: No If all of the above answers are "NO", then may proceed with Cephalosporin use.     Current Outpatient Medications  Medication Sig Dispense Refill   acetaminophen (TYLENOL) 325 MG tablet Take 650 mg by mouth every 6 (six) hours as needed (for pain.).     allopurinol (ZYLOPRIM) 100 MG tablet Take 100 mg by mouth daily with lunch.      aspirin 325 MG tablet Take 325 mg by mouth daily with lunch.      AZO-CRANBERRY PO Take 1 capsule by mouth daily with lunch.      calcitRIOL (ROCALTROL) 0.25 MCG capsule 1 capsule once daily     CINNAMON PO Take 1,000 mg by mouth.     clopidogrel (PLAVIX) 75 MG tablet Take 75 mg by mouth daily with  lunch.      co-enzyme Q-10 30 MG capsule Take 30 mg by mouth daily.     ergocalciferol (VITAMIN D2) 1.25 MG (50000 UT) capsule Take by mouth.     furosemide (LASIX) 40 MG tablet Take 20 mg by mouth once a week. Every other day with lunch     gabapentin (NEURONTIN) 100 MG capsule Take 100 mg by mouth at bedtime.      Garlic 123XX123 MG CAPS Take by mouth.     hyoscyamine (LEVSIN SL) 0.125 MG SL tablet Take 0.125 mg by mouth daily.      isosorbide mononitrate (IMDUR) 60 MG 24 hr tablet Take 60 mg by mouth daily.      MAGNESIUM CITRATE PO Take 100 mg by mouth daily.      metoprolol succinate (TOPROL-XL) 25 MG 24 hr tablet Take 25 mg by mouth daily with lunch. 1/2 tab QD     pantoprazole (PROTONIX) 40 MG tablet Take 40 mg by mouth daily before breakfast.      potassium chloride (MICRO-K) 10 MEQ CR capsule Take 20 mEq by mouth in the morning and at bedtime.      promethazine (PHENERGAN) 25 MG tablet Take 25 mg by mouth every 8 (eight) hours as needed for nausea or  vomiting.      ranolazine (RANEXA) 500 MG 12 hr tablet Take 0.5 tablets by mouth 2 (two) times daily.     riboflavin (VITAMIN B-2) 100 MG TABS tablet Take 100 mg by mouth daily with lunch.      senna (SENOKOT) 8.6 MG tablet Take 4-6 tablets by mouth at bedtime.      sodium bicarbonate 650 MG tablet Take 650 mg by mouth daily with lunch.     vitamin E 400 UNIT capsule Take 400 Units by mouth daily with lunch.      nitroGLYCERIN (NITROSTAT) 0.4 MG SL tablet Place 0.4 mg under the tongue every 5 (five) minutes as needed for chest pain. (Patient not taking: Reported on 03/29/2022)     No current facility-administered medications for this visit.    OBJECTIVE: Vitals:   06/30/22 1410  BP: 114/66  Pulse: 82  Resp: 16  Temp: (!) 96.9 F (36.1 C)  SpO2: 100%     Body mass index is 20.04 kg/m.    ECOG FS:0 - Asymptomatic  General: Well-developed, well-nourished, no acute distress. Eyes: Pink conjunctiva, anicteric sclera. HEENT: Normocephalic, moist mucous membranes. Lungs: No audible wheezing or coughing. Heart: Regular rate and rhythm. Abdomen: Soft, nontender, no obvious distention. Musculoskeletal: No edema, cyanosis, or clubbing. Neuro: Alert, answering all questions appropriately. Cranial nerves grossly intact. Skin: No rashes or petechiae noted. Psych: Normal affect.  LAB RESULTS:  Lab Results  Component Value Date   NA 138 09/20/2020   K 3.6 09/20/2020   CL 112 (H) 09/20/2020   CO2 18 (L) 09/20/2020   GLUCOSE 93 09/20/2020   BUN 33 (H) 09/20/2020   CREATININE 2.93 (H) 09/20/2020   CALCIUM 8.3 (L) 09/20/2020   PROT 6.1 (L) 09/20/2020   ALBUMIN 3.6 09/20/2020   AST 16 09/20/2020   ALT 8 09/20/2020   ALKPHOS 63 09/20/2020   BILITOT 0.6 09/20/2020   GFRNONAA 16 (L) 09/20/2020   GFRAA 39 (L) 08/09/2017    Lab Results  Component Value Date   WBC 7.1 06/29/2022   NEUTROABS 4.5 06/29/2022   HGB 10.1 (L) 06/29/2022   HCT 31.5 (L) 06/29/2022   MCV 92.4 06/29/2022    PLT 319  06/29/2022   Lab Results  Component Value Date   IRON 90 06/29/2022   TIBC 274 06/29/2022   IRONPCTSAT 33 (H) 06/29/2022   Lab Results  Component Value Date   FERRITIN 68 06/29/2022     STUDIES: No results found.  ASSESSMENT: Anemia secondary to chronic renal insufficiency.  PLAN:    Anemia secondary to chronic renal insufficiency: Chronic and unchanged.  Patient's hemoglobin is decreased to 10.1, but stable.  Iron stores continue to be within normal limits.  She does not require treatment today, but patient may benefit from Retacrit in the future if her hemoglobin continues to fall below 10.0. Patient last received 200 mg IV Venofer on August 22, 2019.  Return to clinic in 3 months for laboratory work and consideration of Retacrit.  Patient will then return to clinic in 6 months for laboratory work, further evaluation, and continuation of treatment if needed.  Chronic renal insufficiency: Chronic and unchanged.  She reports that she will declined dialysis if recommended.  Continue follow-up with nephrology as scheduled. Cardiac disease: Patient has chronic angina.  No further cardiac catheterizations are recommended given her worsening renal function.  Continue follow-up with cardiology as scheduled.  Patient expressed understanding and was in agreement with this plan. She also understands that She can call clinic at any time with any questions, concerns, or complaints.    Lloyd Huger, MD   06/30/2022 5:05 PM

## 2022-07-08 ENCOUNTER — Other Ambulatory Visit: Payer: Self-pay | Admitting: Internal Medicine

## 2022-07-08 ENCOUNTER — Ambulatory Visit
Admission: RE | Admit: 2022-07-08 | Discharge: 2022-07-08 | Disposition: A | Discharge status: Home / Self Care | Payer: Medicare HMO | Source: Ambulatory Visit | Attending: Internal Medicine | Admitting: Internal Medicine

## 2022-07-08 DIAGNOSIS — R897 Abnormal histological findings in specimens from other organs, systems and tissues: Secondary | ICD-10-CM

## 2022-07-08 DIAGNOSIS — L819 Disorder of pigmentation, unspecified: Secondary | ICD-10-CM

## 2022-07-08 DIAGNOSIS — N641 Fat necrosis of breast: Secondary | ICD-10-CM | POA: Insufficient documentation

## 2022-07-11 ENCOUNTER — Other Ambulatory Visit: Payer: Self-pay | Admitting: Internal Medicine

## 2022-07-11 DIAGNOSIS — R928 Other abnormal and inconclusive findings on diagnostic imaging of breast: Secondary | ICD-10-CM

## 2022-07-14 ENCOUNTER — Telehealth: Payer: Self-pay

## 2022-07-14 NOTE — Telephone Encounter (Signed)
Patient called saying that she got an approval letter for retracit from 06/30/22 -09/22/22 from her insurance but the injection is scheduled for 5/28. She asks if we can move this appointment sooner before 5/16. Please call her at [phone] to confirm this is ok. Thanks

## 2022-08-23 ENCOUNTER — Inpatient Hospital Stay: Admission: RE | Admit: 2022-08-23 | Payer: Medicare HMO | Source: Ambulatory Visit

## 2022-09-01 ENCOUNTER — Ambulatory Visit
Admission: RE | Admit: 2022-09-01 | Discharge: 2022-09-01 | Disposition: A | Discharge status: Home / Self Care | Payer: Medicare HMO | Source: Ambulatory Visit | Attending: Internal Medicine | Admitting: Internal Medicine

## 2022-09-01 DIAGNOSIS — R928 Other abnormal and inconclusive findings on diagnostic imaging of breast: Secondary | ICD-10-CM | POA: Diagnosis not present

## 2022-09-14 ENCOUNTER — Other Ambulatory Visit: Payer: Self-pay | Admitting: *Deleted

## 2022-09-14 DIAGNOSIS — D631 Anemia in chronic kidney disease: Secondary | ICD-10-CM

## 2022-09-19 ENCOUNTER — Inpatient Hospital Stay: Payer: Medicare HMO | Attending: Oncology

## 2022-09-19 ENCOUNTER — Inpatient Hospital Stay: Payer: Medicare HMO

## 2022-09-19 DIAGNOSIS — N184 Chronic kidney disease, stage 4 (severe): Secondary | ICD-10-CM | POA: Diagnosis present

## 2022-09-19 DIAGNOSIS — D631 Anemia in chronic kidney disease: Secondary | ICD-10-CM | POA: Diagnosis present

## 2022-09-19 LAB — CBC WITH DIFFERENTIAL (CANCER CENTER ONLY)
Abs Immature Granulocytes: 0.18 10*3/uL — ABNORMAL HIGH (ref 0.00–0.07)
Basophils Absolute: 0.1 10*3/uL (ref 0.0–0.1)
Basophils Relative: 1 %
Eosinophils Absolute: 0.3 10*3/uL (ref 0.0–0.5)
Eosinophils Relative: 3 %
HCT: 32.8 % — ABNORMAL LOW (ref 36.0–46.0)
Hemoglobin: 10.8 g/dL — ABNORMAL LOW (ref 12.0–15.0)
Immature Granulocytes: 2 %
Lymphocytes Relative: 20 %
Lymphs Abs: 1.8 10*3/uL (ref 0.7–4.0)
MCH: 29.8 pg (ref 26.0–34.0)
MCHC: 32.9 g/dL (ref 30.0–36.0)
MCV: 90.4 fL (ref 80.0–100.0)
Monocytes Absolute: 0.7 10*3/uL (ref 0.1–1.0)
Monocytes Relative: 8 %
Neutro Abs: 6.3 10*3/uL (ref 1.7–7.7)
Neutrophils Relative %: 66 %
Platelet Count: 367 10*3/uL (ref 150–400)
RBC: 3.63 MIL/uL — ABNORMAL LOW (ref 3.87–5.11)
RDW: 15.5 % (ref 11.5–15.5)
WBC Count: 9.4 10*3/uL (ref 4.0–10.5)
nRBC: 0 % (ref 0.0–0.2)

## 2022-09-19 NOTE — Progress Notes (Signed)
Injection held. Patient aware of test results

## 2022-09-27 ENCOUNTER — Ambulatory Visit: Payer: Medicare HMO

## 2022-09-27 ENCOUNTER — Other Ambulatory Visit: Payer: Medicare HMO

## 2022-09-28 ENCOUNTER — Ambulatory Visit: Payer: Medicare HMO

## 2022-09-28 ENCOUNTER — Other Ambulatory Visit: Payer: Medicare HMO

## 2022-10-04 ENCOUNTER — Ambulatory Visit: Payer: Medicare HMO

## 2022-10-04 ENCOUNTER — Other Ambulatory Visit: Payer: Medicare HMO

## 2022-12-29 ENCOUNTER — Ambulatory Visit: Payer: Medicare HMO | Admitting: Oncology

## 2022-12-29 ENCOUNTER — Other Ambulatory Visit: Payer: Medicare HMO

## 2022-12-29 ENCOUNTER — Ambulatory Visit: Payer: Medicare HMO

## 2023-01-02 ENCOUNTER — Other Ambulatory Visit: Payer: Medicare HMO

## 2023-01-03 ENCOUNTER — Other Ambulatory Visit: Payer: Medicare HMO

## 2023-01-03 ENCOUNTER — Ambulatory Visit: Payer: Medicare HMO

## 2023-01-03 ENCOUNTER — Ambulatory Visit: Payer: Medicare HMO | Admitting: Oncology

## 2023-01-23 ENCOUNTER — Encounter (INDEPENDENT_AMBULATORY_CARE_PROVIDER_SITE_OTHER): Payer: Medicare HMO

## 2023-01-23 ENCOUNTER — Ambulatory Visit (INDEPENDENT_AMBULATORY_CARE_PROVIDER_SITE_OTHER): Payer: Medicare HMO | Admitting: Vascular Surgery

## 2023-01-27 ENCOUNTER — Other Ambulatory Visit: Payer: Self-pay | Admitting: *Deleted

## 2023-01-27 DIAGNOSIS — D631 Anemia in chronic kidney disease: Secondary | ICD-10-CM

## 2023-01-30 ENCOUNTER — Inpatient Hospital Stay: Payer: Medicare HMO

## 2023-01-30 ENCOUNTER — Inpatient Hospital Stay: Payer: Medicare HMO | Admitting: Oncology

## 2023-01-30 ENCOUNTER — Encounter: Payer: Self-pay | Admitting: Oncology

## 2023-01-30 ENCOUNTER — Inpatient Hospital Stay: Payer: Medicare HMO | Attending: Oncology

## 2023-01-30 VITALS — BP 114/60 | HR 76 | Temp 97.8°F | Resp 16 | Ht 60.0 in | Wt 97.5 lb

## 2023-01-30 DIAGNOSIS — D631 Anemia in chronic kidney disease: Secondary | ICD-10-CM

## 2023-01-30 DIAGNOSIS — N184 Chronic kidney disease, stage 4 (severe): Secondary | ICD-10-CM

## 2023-01-30 DIAGNOSIS — D508 Other iron deficiency anemias: Secondary | ICD-10-CM

## 2023-01-30 LAB — CBC WITH DIFFERENTIAL/PLATELET
Abs Immature Granulocytes: 0.07 10*3/uL (ref 0.00–0.07)
Basophils Absolute: 0.1 10*3/uL (ref 0.0–0.1)
Basophils Relative: 1 %
Eosinophils Absolute: 0.3 10*3/uL (ref 0.0–0.5)
Eosinophils Relative: 5 %
HCT: 28.1 % — ABNORMAL LOW (ref 36.0–46.0)
Hemoglobin: 8.6 g/dL — ABNORMAL LOW (ref 12.0–15.0)
Immature Granulocytes: 1 %
Lymphocytes Relative: 20 %
Lymphs Abs: 1.3 10*3/uL (ref 0.7–4.0)
MCH: 30.6 pg (ref 26.0–34.0)
MCHC: 30.6 g/dL (ref 30.0–36.0)
MCV: 100 fL (ref 80.0–100.0)
Monocytes Absolute: 0.6 10*3/uL (ref 0.1–1.0)
Monocytes Relative: 10 %
Neutro Abs: 4.2 10*3/uL (ref 1.7–7.7)
Neutrophils Relative %: 63 %
Platelets: 257 10*3/uL (ref 150–400)
RBC: 2.81 MIL/uL — ABNORMAL LOW (ref 3.87–5.11)
RDW: 15.8 % — ABNORMAL HIGH (ref 11.5–15.5)
WBC: 6.6 10*3/uL (ref 4.0–10.5)
nRBC: 0 % (ref 0.0–0.2)

## 2023-01-30 LAB — IRON AND TIBC
Iron: 70 ug/dL (ref 28–170)
Saturation Ratios: 23 % (ref 10.4–31.8)
TIBC: 307 ug/dL (ref 250–450)
UIBC: 237 ug/dL

## 2023-01-30 LAB — FERRITIN: Ferritin: 143 ng/mL (ref 11–307)

## 2023-01-30 MED ORDER — DARBEPOETIN ALFA 100 MCG/0.5ML IJ SOSY
100.0000 ug | PREFILLED_SYRINGE | Freq: Once | INTRAMUSCULAR | Status: AC
  Administered 2023-01-30: 100 ug via SUBCUTANEOUS
  Filled 2023-01-30: qty 0.5

## 2023-01-30 NOTE — Progress Notes (Signed)
Patients concerned about the drop in hgb over the last 4 months from 10.8 to 8.6

## 2023-01-30 NOTE — Progress Notes (Signed)
[facility]  [phone] [phone]  ID: [name] OB: [birth year]  MR#: [id number]  AOZ#:[id number]  Patient Care Team: Barbette Reichmann, MD as PCP - General (Internal Medicine) Jeralyn Ruths, MD as Consulting Physician (Hematology and Oncology)  CHIEF COMPLAINT: Anemia secondary to chronic renal insufficiency.  INTERVAL HISTORY: Patient returns to clinic today for repeat laboratory work, further evaluation, and consideration of Retacrit.  She is concerned with her declining hemoglobin, but feels well.  She continues to have chronic weakness and fatigue which is unchanged. She has no neurologic complaints. She denies any recent fevers or illnesses. She has a good appetite and denies weight loss.  She has no shortness of breath, cough, or hemoptysis.  She denies any nausea, vomiting, constipation, or diarrhea.  She has no melena or hematochezia.  She has no urinary complaints.  Patient offers no further specific complaints today.  REVIEW OF SYSTEMS:   Review of Systems  Constitutional:  Positive for malaise/fatigue. Negative for fever and weight loss.  Respiratory: Negative.  Negative for cough, hemoptysis and shortness of breath.   Cardiovascular: Negative.  Negative for chest pain and leg swelling.  Gastrointestinal: Negative.  Negative for abdominal pain, blood in stool and melena.  Genitourinary: Negative.  Negative for dysuria and hematuria.  Musculoskeletal: Negative.  Negative for back pain.  Skin: Negative.  Negative for rash.  Neurological:  Positive for weakness. Negative for dizziness, focal weakness and headaches.  Psychiatric/Behavioral: Negative.  The patient is not nervous/anxious.     As per HPI. Otherwise, a complete review of systems is negative.  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   CARDIAC CATHETERIZATION N/A 10/21/2015   Procedure: Left Heart Cath and Coronary Angiography;  Surgeon: Marcina Millard, MD;  Location: [facility];  Service: Cardiovascular;  Laterality: N/A;   CARDIAC CATHETERIZATION N/A 10/21/2015   Procedure: Coronary Stent Intervention;  Surgeon: Marcina Millard, MD;  Location: [facility];  Service: Cardiovascular;  Laterality: N/A;   CARDIAC CATHETERIZATION Left 11/24/2015   Procedure: Coronary Stent Intervention;  Surgeon: Marcina Millard, MD;  Location: [facility];  Service: Cardiovascular;  Laterality: Left;   CARDIAC SURGERY  2007   cardiac stent place   CATARACT EXTRACTION  2012   CHOLECYSTECTOMY     CORONARY ANGIOPLASTY WITH STENT PLACEMENT  2013   CORONARY ARTERY BYPASS GRAFT  2008   CORONARY STENT INTERVENTION N/A 07/04/2017   Procedure: CORONARY STENT INTERVENTION;  Surgeon: Marcina Millard, MD;  Location: [facility];  Service: Cardiovascular;  Laterality: N/A;   CORONARY STENT INTERVENTION N/A 08/08/2017   Procedure: CORONARY STENT INTERVENTION;  Surgeon: Marcina Millard, MD;  Location: [facility];  Service: Cardiovascular;  Laterality: N/A;   CYSTOSCOPY W/ RETROGRADES Bilateral  12/29/2014   Procedure: CYSTOSCOPY WITH RETROGRADE PYELOGRAM;  Surgeon: Lorraine Lax, MD;  Location: [facility];  Service: Urology;  Laterality: Bilateral;   CYSTOSCOPY WITH BIOPSY N/A 12/29/2014   Procedure: CYSTOSCOPY WITH BIOPSY;  Surgeon: Lorraine Lax, MD;   Location: [facility];  Service: Urology;  Laterality: N/A;   HAND SURGERY  1998   LEFT HEART CATH AND CORONARY ANGIOGRAPHY Left 08/08/2017   Procedure: LEFT HEART CATH AND CORONARY ANGIOGRAPHY;  Surgeon: Marcina Millard, MD;  Location: [facility];  Service: Cardiovascular;  Laterality: Left;   LEFT HEART CATH AND CORONARY ANGIOGRAPHY Left 08/01/2019   Procedure: LEFT HEART CATH AND CORONARY ANGIOGRAPHY;  Surgeon: Marcina Millard, MD;  Location: [facility];  Service: Cardiovascular;  Laterality: Left;   LEFT HEART CATH AND CORS/GRAFTS ANGIOGRAPHY N/A 07/04/2017   Procedure: LEFT HEART CATH AND CORS/GRAFTS ANGIOGRAPHY;  Surgeon: Marcina Millard, MD;  Location: [facility];  Service: Cardiovascular;  Laterality: N/A;   SIGMOID RESECTION / RECTOPEXY  2006   SKIN CANCER EXCISION  2013   TENDON REPAIR     right elbow   TRIGGER FINGER RELEASE      FAMILY HISTORY: Family History  Problem Relation Age of Onset   Kidney Stones Father    Prostate cancer Father    Heart disease Father    Kidney Stones Brother    Heart disease Mother     ADVANCED DIRECTIVES (Y/N):  N  HEALTH MAINTENANCE: Social History   Tobacco Use   Smoking status: Never    Passive exposure: Never   Smokeless tobacco: Never  Vaping Use   Vaping status: Never Used  Substance Use Topics   Alcohol use: No   Drug use: No     Colonoscopy:  PAP:  Bone density:  Lipid panel:  Allergies  Allergen Reactions   Prochlorperazine Nausea And Vomiting, Other (See Comments) and Nausea Only    Severe vomiting   Ezetimibe Other (See Comments)    Muscle Pain   Statins Other (See Comments)    Muscle Pain   Tape Other (See Comments)    Pulls my skin off when the tape is removed. Use paper tape only.   Cephalexin Rash   Metoclopramide Rash and Other (See Comments)    Elevates BP, face draws to one side   Penicillins Rash and Other (See Comments)    Has patient had a PCN reaction  causing immediate rash, facial/tongue/throat swelling, SOB or lightheadedness with hypotension: Yes Has patient had a PCN reaction causing severe rash involving mucus membranes or skin necrosis: Yes Has patient had a PCN reaction that required hospitalization No Has patient had a PCN reaction occurring within the last 10 years: No If all of the above answers are "NO", then may proceed with Cephalosporin use.     Current Outpatient Medications  Medication Sig Dispense Refill   acetaminophen (TYLENOL) 325 MG tablet Take 650 mg by mouth every 6 (six) hours as needed (for pain.).     allopurinol (ZYLOPRIM) 100 MG tablet Take 100 mg by mouth daily with lunch.      aspirin 325 MG tablet Take 325 mg by mouth daily with lunch.      AZO-CRANBERRY PO Take 1 capsule by mouth daily with lunch.      calcitRIOL (ROCALTROL) 0.25 MCG capsule 1 capsule once daily     CINNAMON PO Take 1,000 mg by mouth.     clopidogrel (PLAVIX) 75 MG tablet Take 75 mg  by mouth daily with lunch.      co-enzyme Q-10 30 MG capsule Take 30 mg by mouth daily.     ergocalciferol (VITAMIN D2) 1.25 MG (50000 UT) capsule Take by mouth.     furosemide (LASIX) 40 MG tablet Take 20 mg by mouth once a week. Every other day with lunch     gabapentin (NEURONTIN) 100 MG capsule Take 100 mg by mouth at bedtime.      Garlic 1000 MG CAPS Take by mouth.     hyoscyamine (LEVSIN SL) 0.125 MG SL tablet Take 0.125 mg by mouth daily.      isosorbide mononitrate (IMDUR) 60 MG 24 hr tablet Take 60 mg by mouth daily.      MAGNESIUM CITRATE PO Take 100 mg by mouth daily.      metoprolol succinate (TOPROL-XL) 25 MG 24 hr tablet Take 25 mg by mouth daily with lunch. 1/2 tab QD     pantoprazole (PROTONIX) 40 MG tablet Take 40 mg by mouth daily before breakfast.      potassium chloride (MICRO-K) 10 MEQ CR capsule Take 20 mEq by mouth in the morning and at bedtime.      promethazine (PHENERGAN) 25 MG tablet Take 25 mg by mouth every 8 (eight) hours as  needed for nausea or vomiting.      senna (SENOKOT) 8.6 MG tablet Take 4-6 tablets by mouth at bedtime.      sodium bicarbonate 650 MG tablet Take 650 mg by mouth daily with lunch.     vitamin E 400 UNIT capsule Take 400 Units by mouth daily with lunch.      nitroGLYCERIN (NITROSTAT) 0.4 MG SL tablet Place 0.4 mg under the tongue every 5 (five) minutes as needed for chest pain. (Patient not taking: Reported on 03/29/2022)     riboflavin (VITAMIN B-2) 100 MG TABS tablet Take 100 mg by mouth daily with lunch.  (Patient not taking: Reported on 01/30/2023)     No current facility-administered medications for this visit.    OBJECTIVE: Vitals:   01/30/23 0922  BP: 114/60  Pulse: 76  Resp: 16  Temp: 97.8 F (36.6 C)  SpO2: 100%     Body mass index is 19.04 kg/m.    ECOG FS:0 - Asymptomatic  General: Well-developed, well-nourished, no acute distress. Eyes: Pink conjunctiva, anicteric sclera. HEENT: Normocephalic, moist mucous membranes. Lungs: No audible wheezing or coughing. Heart: Regular rate and rhythm. Abdomen: Soft, nontender, no obvious distention. Musculoskeletal: No edema, cyanosis, or clubbing. Neuro: Alert, answering all questions appropriately. Cranial nerves grossly intact. Skin: No rashes or petechiae noted. Psych: Normal affect.  LAB RESULTS:  Lab Results  Component Value Date   NA 138 09/20/2020   K 3.6 09/20/2020   CL 112 (H) 09/20/2020   CO2 18 (L) 09/20/2020   GLUCOSE 93 09/20/2020   BUN 33 (H) 09/20/2020   CREATININE 2.93 (H) 09/20/2020   CALCIUM 8.3 (L) 09/20/2020   PROT 6.1 (L) 09/20/2020   ALBUMIN 3.6 09/20/2020   AST 16 09/20/2020   ALT 8 09/20/2020   ALKPHOS 63 09/20/2020   BILITOT 0.6 09/20/2020   GFRNONAA 16 (L) 09/20/2020   GFRAA 39 (L) 08/09/2017    Lab Results  Component Value Date   WBC 6.6 01/30/2023   NEUTROABS 4.2 01/30/2023   HGB 8.6 (L) 01/30/2023   HCT 28.1 (L) 01/30/2023   MCV 100.0 01/30/2023   PLT 257 01/30/2023   Lab  Results  Component Value Date  IRON 70 01/30/2023   TIBC 307 01/30/2023   IRONPCTSAT 23 01/30/2023   Lab Results  Component Value Date   FERRITIN 143 01/30/2023     STUDIES: No results found.  ASSESSMENT: Anemia secondary to chronic renal insufficiency.  PLAN:    Anemia secondary to chronic renal insufficiency: Patient's hemoglobin is declining, likely secondary to her worsening renal function.  Iron stores continue to be within normal limits.  Proceed with 40,000 units of Retacrit today.  Patient will receive treatment if her hemoglobin is below 10.0.  She last received 200 mg IV Venofer on August 22, 2019.  Return to clinic in 1, 2, and 3 months for lab and Retacrit only.  Patient then return to clinic in 4 months for further evaluation and continuation of treatment if needed.   Chronic renal insufficiency: Kidney function appears to be getting worse.  She continues to states she will likely decline dialysis.  Continue follow-up with nephrology as scheduled. Cardiac disease: Patient has chronic angina, but does not complain of this today.  No further cardiac catheterizations are recommended given her worsening renal function.  Continue follow-up with cardiology as scheduled.  Patient expressed understanding and was in agreement with this plan. She also understands that She can call clinic at any time with any questions, concerns, or complaints.    Jeralyn Ruths, MD   01/30/2023 12:40 PM

## 2023-02-15 ENCOUNTER — Other Ambulatory Visit: Payer: Self-pay | Admitting: Orthopedic Surgery

## 2023-02-15 ENCOUNTER — Ambulatory Visit
Admission: RE | Admit: 2023-02-15 | Discharge: 2023-02-15 | Disposition: A | Discharge status: Home / Self Care | Payer: Medicare HMO | Source: Ambulatory Visit | Attending: Orthopedic Surgery | Admitting: Orthopedic Surgery

## 2023-02-15 DIAGNOSIS — M4807 Spinal stenosis, lumbosacral region: Secondary | ICD-10-CM | POA: Diagnosis present

## 2023-02-27 ENCOUNTER — Other Ambulatory Visit: Payer: Self-pay | Admitting: *Deleted

## 2023-02-27 DIAGNOSIS — D508 Other iron deficiency anemias: Secondary | ICD-10-CM

## 2023-02-28 ENCOUNTER — Inpatient Hospital Stay: Payer: Medicare HMO | Attending: Oncology

## 2023-02-28 ENCOUNTER — Inpatient Hospital Stay: Payer: Medicare HMO

## 2023-02-28 VITALS — BP 101/57

## 2023-02-28 DIAGNOSIS — D508 Other iron deficiency anemias: Secondary | ICD-10-CM

## 2023-02-28 DIAGNOSIS — D631 Anemia in chronic kidney disease: Secondary | ICD-10-CM | POA: Insufficient documentation

## 2023-02-28 DIAGNOSIS — N184 Chronic kidney disease, stage 4 (severe): Secondary | ICD-10-CM | POA: Insufficient documentation

## 2023-02-28 LAB — CBC WITH DIFFERENTIAL/PLATELET
Abs Immature Granulocytes: 0.08 10*3/uL — ABNORMAL HIGH (ref 0.00–0.07)
Basophils Absolute: 0.1 10*3/uL (ref 0.0–0.1)
Basophils Relative: 1 %
Eosinophils Absolute: 0.3 10*3/uL (ref 0.0–0.5)
Eosinophils Relative: 5 %
HCT: 29.9 % — ABNORMAL LOW (ref 36.0–46.0)
Hemoglobin: 9.5 g/dL — ABNORMAL LOW (ref 12.0–15.0)
Immature Granulocytes: 1 %
Lymphocytes Relative: 21 %
Lymphs Abs: 1.3 10*3/uL (ref 0.7–4.0)
MCH: 31.4 pg (ref 26.0–34.0)
MCHC: 31.8 g/dL (ref 30.0–36.0)
MCV: 98.7 fL (ref 80.0–100.0)
Monocytes Absolute: 0.7 10*3/uL (ref 0.1–1.0)
Monocytes Relative: 11 %
Neutro Abs: 3.8 10*3/uL (ref 1.7–7.7)
Neutrophils Relative %: 61 %
Platelets: 279 10*3/uL (ref 150–400)
RBC: 3.03 MIL/uL — ABNORMAL LOW (ref 3.87–5.11)
RDW: 15.2 % (ref 11.5–15.5)
WBC: 6.1 10*3/uL (ref 4.0–10.5)
nRBC: 0 % (ref 0.0–0.2)

## 2023-02-28 LAB — FERRITIN: Ferritin: 48 ng/mL (ref 11–307)

## 2023-02-28 LAB — IRON AND TIBC
Iron: 71 ug/dL (ref 28–170)
Saturation Ratios: 23 % (ref 10.4–31.8)
TIBC: 308 ug/dL (ref 250–450)
UIBC: 237 ug/dL

## 2023-02-28 MED ORDER — DARBEPOETIN ALFA 100 MCG/0.5ML IJ SOSY
100.0000 ug | PREFILLED_SYRINGE | Freq: Once | INTRAMUSCULAR | Status: AC
  Administered 2023-02-28: 100 ug via SUBCUTANEOUS
  Filled 2023-02-28: qty 0.5

## 2023-03-01 ENCOUNTER — Ambulatory Visit: Payer: Medicare HMO

## 2023-03-01 ENCOUNTER — Other Ambulatory Visit: Payer: Medicare HMO

## 2023-03-02 ENCOUNTER — Ambulatory Visit (INDEPENDENT_AMBULATORY_CARE_PROVIDER_SITE_OTHER): Payer: Medicare HMO | Admitting: Nurse Practitioner

## 2023-03-02 ENCOUNTER — Encounter (INDEPENDENT_AMBULATORY_CARE_PROVIDER_SITE_OTHER): Payer: Medicare HMO

## 2023-03-29 ENCOUNTER — Ambulatory Visit (INDEPENDENT_AMBULATORY_CARE_PROVIDER_SITE_OTHER): Payer: Medicare HMO | Admitting: Nurse Practitioner

## 2023-03-29 ENCOUNTER — Encounter (INDEPENDENT_AMBULATORY_CARE_PROVIDER_SITE_OTHER): Payer: Medicare HMO

## 2023-03-31 ENCOUNTER — Other Ambulatory Visit: Payer: Self-pay | Admitting: *Deleted

## 2023-03-31 DIAGNOSIS — D508 Other iron deficiency anemias: Secondary | ICD-10-CM

## 2023-04-03 ENCOUNTER — Inpatient Hospital Stay: Payer: Medicare HMO | Attending: Oncology

## 2023-04-03 ENCOUNTER — Inpatient Hospital Stay: Payer: Medicare HMO

## 2023-04-03 DIAGNOSIS — D631 Anemia in chronic kidney disease: Secondary | ICD-10-CM | POA: Insufficient documentation

## 2023-04-03 DIAGNOSIS — N184 Chronic kidney disease, stage 4 (severe): Secondary | ICD-10-CM | POA: Diagnosis present

## 2023-04-03 DIAGNOSIS — D508 Other iron deficiency anemias: Secondary | ICD-10-CM

## 2023-04-03 LAB — CBC WITH DIFFERENTIAL/PLATELET
Abs Immature Granulocytes: 0.22 10*3/uL — ABNORMAL HIGH (ref 0.00–0.07)
Basophils Absolute: 0.1 10*3/uL (ref 0.0–0.1)
Basophils Relative: 1 %
Eosinophils Absolute: 0.1 10*3/uL (ref 0.0–0.5)
Eosinophils Relative: 1 %
HCT: 32.8 % — ABNORMAL LOW (ref 36.0–46.0)
Hemoglobin: 10.3 g/dL — ABNORMAL LOW (ref 12.0–15.0)
Immature Granulocytes: 2 %
Lymphocytes Relative: 20 %
Lymphs Abs: 1.8 10*3/uL (ref 0.7–4.0)
MCH: 30.5 pg (ref 26.0–34.0)
MCHC: 31.4 g/dL (ref 30.0–36.0)
MCV: 97 fL (ref 80.0–100.0)
Monocytes Absolute: 0.8 10*3/uL (ref 0.1–1.0)
Monocytes Relative: 8 %
Neutro Abs: 6.2 10*3/uL (ref 1.7–7.7)
Neutrophils Relative %: 68 %
Platelets: 310 10*3/uL (ref 150–400)
RBC: 3.38 MIL/uL — ABNORMAL LOW (ref 3.87–5.11)
RDW: 15 % (ref 11.5–15.5)
WBC: 9.2 10*3/uL (ref 4.0–10.5)
nRBC: 0 % (ref 0.0–0.2)

## 2023-04-03 LAB — IRON AND TIBC
Iron: 137 ug/dL (ref 28–170)
Saturation Ratios: 40 % — ABNORMAL HIGH (ref 10.4–31.8)
TIBC: 340 ug/dL (ref 250–450)
UIBC: 203 ug/dL

## 2023-04-03 LAB — FERRITIN: Ferritin: 70 ng/mL (ref 11–307)

## 2023-04-03 NOTE — Progress Notes (Signed)
Patient Hbg is 10.3. Hold retacrit.

## 2023-05-04 ENCOUNTER — Other Ambulatory Visit: Payer: Medicare HMO

## 2023-05-04 ENCOUNTER — Ambulatory Visit: Payer: Medicare HMO

## 2023-05-05 ENCOUNTER — Other Ambulatory Visit: Payer: Self-pay | Admitting: *Deleted

## 2023-05-05 DIAGNOSIS — D509 Iron deficiency anemia, unspecified: Secondary | ICD-10-CM

## 2023-05-05 DIAGNOSIS — D631 Anemia in chronic kidney disease: Secondary | ICD-10-CM

## 2023-05-08 ENCOUNTER — Inpatient Hospital Stay: Payer: Medicare HMO

## 2023-05-08 ENCOUNTER — Inpatient Hospital Stay: Payer: Medicare HMO | Attending: Oncology

## 2023-05-08 VITALS — BP 102/54

## 2023-05-08 DIAGNOSIS — D509 Iron deficiency anemia, unspecified: Secondary | ICD-10-CM

## 2023-05-08 DIAGNOSIS — D631 Anemia in chronic kidney disease: Secondary | ICD-10-CM | POA: Diagnosis present

## 2023-05-08 DIAGNOSIS — N184 Chronic kidney disease, stage 4 (severe): Secondary | ICD-10-CM | POA: Diagnosis present

## 2023-05-08 DIAGNOSIS — D508 Other iron deficiency anemias: Secondary | ICD-10-CM

## 2023-05-08 LAB — CBC WITH DIFFERENTIAL (CANCER CENTER ONLY)
Abs Immature Granulocytes: 0.07 10*3/uL (ref 0.00–0.07)
Basophils Absolute: 0.1 10*3/uL (ref 0.0–0.1)
Basophils Relative: 1 %
Eosinophils Absolute: 0.2 10*3/uL (ref 0.0–0.5)
Eosinophils Relative: 3 %
HCT: 25.6 % — ABNORMAL LOW (ref 36.0–46.0)
Hemoglobin: 7.9 g/dL — ABNORMAL LOW (ref 12.0–15.0)
Immature Granulocytes: 1 %
Lymphocytes Relative: 17 %
Lymphs Abs: 1.2 10*3/uL (ref 0.7–4.0)
MCH: 29.9 pg (ref 26.0–34.0)
MCHC: 30.9 g/dL (ref 30.0–36.0)
MCV: 97 fL (ref 80.0–100.0)
Monocytes Absolute: 0.7 10*3/uL (ref 0.1–1.0)
Monocytes Relative: 9 %
Neutro Abs: 5.2 10*3/uL (ref 1.7–7.7)
Neutrophils Relative %: 69 %
Platelet Count: 296 10*3/uL (ref 150–400)
RBC: 2.64 MIL/uL — ABNORMAL LOW (ref 3.87–5.11)
RDW: 17 % — ABNORMAL HIGH (ref 11.5–15.5)
WBC Count: 7.4 10*3/uL (ref 4.0–10.5)
nRBC: 0 % (ref 0.0–0.2)

## 2023-05-08 LAB — IRON AND TIBC
Iron: 83 ug/dL (ref 28–170)
Saturation Ratios: 31 % (ref 10.4–31.8)
TIBC: 270 ug/dL (ref 250–450)
UIBC: 187 ug/dL

## 2023-05-08 LAB — FERRITIN: Ferritin: 91 ng/mL (ref 11–307)

## 2023-05-08 MED ORDER — DARBEPOETIN ALFA 100 MCG/0.5ML IJ SOSY
100.0000 ug | PREFILLED_SYRINGE | Freq: Once | INTRAMUSCULAR | Status: AC
Start: 2023-05-08 — End: 2023-05-08
  Administered 2023-05-08: 100 ug via SUBCUTANEOUS
  Filled 2023-05-08: qty 0.5

## 2023-05-11 ENCOUNTER — Other Ambulatory Visit (INDEPENDENT_AMBULATORY_CARE_PROVIDER_SITE_OTHER): Payer: Self-pay | Admitting: Vascular Surgery

## 2023-05-11 DIAGNOSIS — I6523 Occlusion and stenosis of bilateral carotid arteries: Secondary | ICD-10-CM

## 2023-05-15 ENCOUNTER — Ambulatory Visit: Payer: Medicare HMO | Admitting: Neurosurgery

## 2023-05-16 ENCOUNTER — Encounter (INDEPENDENT_AMBULATORY_CARE_PROVIDER_SITE_OTHER): Payer: Medicare HMO

## 2023-05-16 ENCOUNTER — Ambulatory Visit (INDEPENDENT_AMBULATORY_CARE_PROVIDER_SITE_OTHER): Payer: Medicare HMO | Admitting: Nurse Practitioner

## 2023-05-22 ENCOUNTER — Inpatient Hospital Stay
Admission: RE | Admit: 2023-05-22 | Discharge: 2023-05-22 | Disposition: A | Discharge status: Home / Self Care | Payer: Self-pay | Source: Ambulatory Visit | Attending: Neurosurgery | Admitting: Neurosurgery

## 2023-05-22 ENCOUNTER — Other Ambulatory Visit: Payer: Self-pay | Admitting: Family Medicine

## 2023-05-22 DIAGNOSIS — Z049 Encounter for examination and observation for unspecified reason: Secondary | ICD-10-CM

## 2023-05-23 NOTE — Progress Notes (Signed)
 Referring Physician:  Brant Caldron, FNP [address] [place],  [place] [address]  Primary Physician:  Antonio Baumgarten, MD  History of Present Illness: 05/24/2023 Ms. [name] is here today with a chief complaint of back pain radiating to the buttocks and bilateral lower extremities.  She has been dealing with this for multiple years but she does feel like it is getting progressively worse.  She does notice that her legs give out on her intermittently.  She has participated in physical therapy.  Does feel her legs give out on her, however has not noticed any muscle wasting or muscle loss.  No persistent numbness.  She has had many years of spine injections to manage her pain.  She is trying to avoid surgery if possible with utilizing her conservative options.  No signs of cauda equina syndrome.  Conservative measures:  Physical therapy: has participated in PT at [facility]  Multimodal medical therapy including regular antiinflammatories: Gabapentin, Tramadol , Tylenol ,   Injections: 03/28/2023: Right S1 transforaminal ESI (Celestone 12 mg)  03/01/2023: Bilateral S1 transforaminal ESI (complete relief of left side no relief of right) 04/18/2018: Right S1 transforaminal ESI (moderate to good relief) 03/12/2018: Right S1 transforaminal ESI (moderate to good relief) 04/27/2017: Right S1 transforaminal ESI (moderate to good relief) 12/29/2016: Right S1 transforaminal ESI (moderate to good relief) 11/23/2016: Right S1 transforaminal ESI 10/12/2016: Right S1 transforaminal ESI   Past Surgery: none  The symptoms are causing a significant impact on the patient's life.   I have utilized the care everywhere function in epic to review the outside records available from external health systems.  Review of Systems:  A 10 point review of systems is negative, except for the pertinent positives and negatives detailed in the HPI.  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   CARDIAC CATHETERIZATION N/A 10/21/2015   Procedure: Left Heart Cath and Coronary Angiography;  Surgeon: Percival Brace, MD;  Location: [facility];  Service: Cardiovascular;  Laterality: N/A;   CARDIAC CATHETERIZATION N/A 10/21/2015   Procedure: Coronary Stent Intervention;  Surgeon: Percival Brace, MD;  Location: [facility];  Service: Cardiovascular;  Laterality: N/A;   CARDIAC CATHETERIZATION Left 11/24/2015   Procedure: Coronary Stent Intervention;  Surgeon: Percival Brace, MD;  Location: [facility];  Service: Cardiovascular;  Laterality: Left;   CARDIAC SURGERY  2007   cardiac stent place   CATARACT EXTRACTION  2012   CHOLECYSTECTOMY     CORONARY ANGIOPLASTY WITH STENT PLACEMENT  2013   CORONARY ARTERY BYPASS GRAFT  2008   CORONARY STENT INTERVENTION N/A 07/04/2017   Procedure: CORONARY STENT INTERVENTION;  Surgeon: Percival Brace, MD;  Location: [facility];  Service: Cardiovascular;  Laterality: N/A;   CORONARY STENT INTERVENTION N/A 08/08/2017  Procedure: CORONARY STENT INTERVENTION;  Surgeon: Percival Brace, MD;  Location: [facility];  Service: Cardiovascular;  Laterality: N/A;   CYSTOSCOPY W/ RETROGRADES Bilateral 12/29/2014   Procedure: CYSTOSCOPY WITH RETROGRADE PYELOGRAM;  Surgeon: Michelina Aho, MD;  Location: [facility];  Service: Urology;  Laterality:  Bilateral;   CYSTOSCOPY WITH BIOPSY N/A 12/29/2014   Procedure: CYSTOSCOPY WITH BIOPSY;  Surgeon: Michelina Aho, MD;  Location: [facility];  Service: Urology;  Laterality: N/A;   HAND SURGERY  1998   LEFT HEART CATH AND CORONARY ANGIOGRAPHY Left 08/08/2017   Procedure: LEFT HEART CATH AND CORONARY ANGIOGRAPHY;  Surgeon: Percival Brace, MD;  Location: [facility];  Service: Cardiovascular;  Laterality: Left;   LEFT HEART CATH AND CORONARY ANGIOGRAPHY Left 08/01/2019   Procedure: LEFT HEART CATH AND CORONARY ANGIOGRAPHY;  Surgeon: Percival Brace, MD;  Location: [facility];  Service: Cardiovascular;  Laterality: Left;   LEFT HEART CATH AND CORS/GRAFTS ANGIOGRAPHY N/A 07/04/2017   Procedure: LEFT HEART CATH AND CORS/GRAFTS ANGIOGRAPHY;  Surgeon: Percival Brace, MD;  Location: [facility];  Service: Cardiovascular;  Laterality: N/A;   SIGMOID RESECTION / RECTOPEXY  2006   SKIN CANCER EXCISION  2013   TENDON REPAIR     right elbow   TRIGGER FINGER RELEASE      Allergies: Allergies as of 05/24/2023 - Review Complete 05/24/2023  Allergen Reaction Noted   Prochlorperazine Nausea And Vomiting, Other (See Comments), and Nausea Only 04/23/2013   Ezetimibe Other (See Comments) 11/04/2014   Statins Other (See Comments) 11/04/2014   Tape Other (See Comments) 12/22/2014   Cephalexin Rash 11/04/2014   Metoclopramide Rash and Other (See Comments) 04/23/2013   Penicillins Rash and Other (See Comments) 11/04/2014    Medications:  Current Outpatient Medications:    acetaminophen  (TYLENOL ) 325 MG tablet, Take 650 mg by mouth every 6 (six) hours as needed (for pain.)., Disp: , Rfl:    allopurinol  (ZYLOPRIM ) 100 MG tablet, Take 100 mg by mouth daily with lunch. , Disp: , Rfl:    aspirin  325 MG tablet, Take 325 mg by mouth daily with lunch. , Disp: , Rfl:    AZO-CRANBERRY PO, Take 1 capsule by mouth daily with lunch. , Disp: , Rfl:    calcitRIOL (ROCALTROL)  0.25 MCG capsule, 1 capsule once daily, Disp: , Rfl:    CINNAMON PO, Take 1,000 mg by mouth., Disp: , Rfl:    clopidogrel  (PLAVIX ) 75 MG tablet, Take 75 mg by mouth daily with lunch. , Disp: , Rfl:    co-enzyme Q-10 30 MG capsule, Take 30 mg by mouth daily., Disp: , Rfl:    ergocalciferol (VITAMIN D2) 1.25 MG (50000 UT) capsule, Take by mouth., Disp: , Rfl:    furosemide  (LASIX ) 40 MG tablet, Take 20 mg by mouth once a week. Every other day with lunch, Disp: , Rfl:    gabapentin (NEURONTIN) 100 MG capsule, Take 100 mg by mouth at bedtime. , Disp: , Rfl:    Garlic 1000 MG CAPS, Take by mouth., Disp: , Rfl:    isosorbide  mononitrate (IMDUR ) 60 MG 24 hr tablet, Take 60 mg by mouth daily. , Disp: , Rfl:    MAGNESIUM CITRATE PO, Take 100 mg by mouth daily. , Disp: , Rfl:    metoprolol  succinate (TOPROL -XL) 25 MG 24 hr tablet, Take 25 mg by mouth daily with lunch. 1/2 tab QD, Disp: , Rfl:    pantoprazole  (PROTONIX ) 40 MG tablet, Take 40 mg  by mouth daily before breakfast. , Disp: , Rfl:    potassium chloride  (MICRO-K ) 10 MEQ CR capsule, Take 20 mEq by mouth in the morning and at bedtime. , Disp: , Rfl:    promethazine  (PHENERGAN ) 25 MG tablet, Take 25 mg by mouth every 8 (eight) hours as needed for nausea or vomiting. , Disp: , Rfl:    senna (SENOKOT) 8.6 MG tablet, Take 4-6 tablets by mouth at bedtime. , Disp: , Rfl:    sodium bicarbonate 650 MG tablet, Take 650 mg by mouth daily with lunch., Disp: , Rfl:    vitamin E 400 UNIT capsule, Take 400 Units by mouth daily with lunch. , Disp: , Rfl:    hyoscyamine (LEVSIN SL) 0.125 MG SL tablet, Take 0.125 mg by mouth daily.  (Patient not taking: Reported on 05/24/2023), Disp: , Rfl:    nitroGLYCERIN  (NITROSTAT ) 0.4 MG SL tablet, Place 0.4 mg under the tongue every 5 (five) minutes as needed for chest pain. (Patient not taking: Reported on 03/29/2022), Disp: , Rfl:    riboflavin (VITAMIN B-2) 100 MG TABS tablet, Take 100 mg by mouth daily with lunch.  (Patient  not taking: Reported on 01/30/2023), Disp: , Rfl:   Social History: Social History   Tobacco Use   Smoking status: Never    Passive exposure: Never   Smokeless tobacco: Never  Vaping Use   Vaping status: Never Used  Substance Use Topics   Alcohol use: No   Drug use: No    Family Medical History: Family History  Problem Relation Age of Onset   Kidney Stones Father    Prostate cancer Father    Heart disease Father    Kidney Stones Brother    Heart disease Mother     Physical Examination: Vitals:   05/24/23 1001  BP: 114/68    General: Patient is in no apparent distress. Attention to examination is appropriate.  Neck:   Supple.  Full range of motion.  Respiratory: Patient is breathing without any difficulty.   NEUROLOGICAL:     Awake, alert, oriented to person, place, and time.  Speech is clear and fluent.   Cranial Nerves: Pupils equal round and reactive to light.  Facial tone is symmetric.  Facial sensation is symmetric. Shoulder shrug is symmetric. Tongue protrusion is midline.    Strength:  Side Iliopsoas Quads Hamstring PF DF EHL  R 5 5 5 5 5 5   L 5 5 5 5 5 5    Reflexes are 1+ patella and achilles.   Hoffman's is absent. Clonus is absent  Bilateral upper and lower extremity sensation is intact to light touch      No evidence of dysmetria noted.  Gait is antalgic   Imaging: Narrative & Impression  CLINICAL DATA:  Pain from waist to knees with standing. Numbness in both feet when walking. Numbness in the right leg to the foot. Symptoms over the last 2 months.   EXAM: MRI LUMBAR SPINE WITHOUT CONTRAST   TECHNIQUE: Multiplanar, multisequence MR imaging of the lumbar spine was performed. No intravenous contrast was administered.   COMPARISON:  MRI lumbar spine 09/19/2016.   FINDINGS: Segmentation: A transitional S1 segment is present with a rudimentary disc at the S1-2 level.   Alignment: Progressive grade 2 anterolisthesis at L5-S1 measures  9 mm. No other significant listhesis is present. Lumbar lordosis is preserved.   Vertebrae: Edematous type 1 Modic changes are present at L5-S1. Marrow signal and vertebral body heights are otherwise normal.  Conus medullaris and cauda equina: Conus extends to the L2-3 level. Conus and cauda equina appear normal.   Paraspinal and other soft tissues: Limited imaging the abdomen is unremarkable. There is no significant adenopathy. No solid organ lesions are present.   Disc levels:   L1-2: Normal disc signal and height is present. No focal protrusion or stenosis is present.   L2-3: Normal disc signal and height is present. No focal protrusion or stenosis is present.   L3-4: Progression of a mild broad-based disc protrusion and central annular tear is present without focal stenosis.   L4-5: Mild disc bulging and facet hypertrophy are present. No focal stenosis is present.   L5-S1. Uncovering of a broad-based disc protrusion leads to severe central canal stenosis. Severe right and mild left foraminal narrowing is present.   S1-2: No significant disc protrusion or stenosis is present.   IMPRESSION: 1. Progressive grade 2 anterolisthesis at L5-S1 with uncovering of a broad-based disc protrusion leading to severe central canal stenosis. 2. Severe right and mild left foraminal narrowing at L5-S1. 3. Mild disc bulging and facet hypertrophy at L4-5 without focal stenosis. 4. Progression of a mild broad-based disc protrusion and central annular tear at L3-4 without focal stenosis.     Electronically Signed   By: Audree Leas M.D.   On: 02/15/2023 20:00   Her x-rays are in process awaiting final read. I have personally reviewed the images and agree with the above interpretation.  Medical Decision Making/Assessment and Plan: Ms. Kozisek is a pleasant 78 y.o. 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  This is  significant approximately grade 2, causes stenosis of her bilateral foramen.  This is likely the cause of her ongoing low back pain that radiates into her buttocks and down to the bottom of her legs resulting in S1 type symptoms, lumbar claudication, and radiculopathy pain.  She also has mechanical back pain.  Her spondylolisthesis is quite significant and likely symptomatic in the cause of her issues.  Given her age and low body weight I like to evaluate her for any osteoporosis or any osteopenia and have ordered a DEXA scan.  I also like to evaluate her with a CT scan of her spine to evaluate for any progressive pars defects or pars fractures that could be causing worsening of her spondylolisthesis.  I will reach out to her pain team for possible injections, she is going on a trip to [place]  next month and previously had a good experience and would like to try injections again before she leaves  Thank you for involving me in the care of this patient.   Spent a total of 40 minutes reviewing this patient's chart, coordinating her care, face-to-face evaluation as well as discussion of treatment options for her spondylolisthesis.  Carroll Clamp MD/MSCR Neurosurgery

## 2023-05-24 ENCOUNTER — Encounter: Payer: Self-pay | Admitting: Neurosurgery

## 2023-05-24 ENCOUNTER — Ambulatory Visit (INDEPENDENT_AMBULATORY_CARE_PROVIDER_SITE_OTHER): Payer: Medicare HMO | Admitting: Neurosurgery

## 2023-05-24 VITALS — BP 114/68 | Wt 97.8 lb

## 2023-05-24 DIAGNOSIS — M48062 Spinal stenosis, lumbar region with neurogenic claudication: Secondary | ICD-10-CM | POA: Diagnosis not present

## 2023-05-24 DIAGNOSIS — M4317 Spondylolisthesis, lumbosacral region: Secondary | ICD-10-CM | POA: Insufficient documentation

## 2023-05-24 DIAGNOSIS — M79604 Pain in right leg: Secondary | ICD-10-CM | POA: Insufficient documentation

## 2023-05-24 DIAGNOSIS — R29898 Other symptoms and signs involving the musculoskeletal system: Secondary | ICD-10-CM | POA: Insufficient documentation

## 2023-05-24 DIAGNOSIS — M4727 Other spondylosis with radiculopathy, lumbosacral region: Secondary | ICD-10-CM

## 2023-05-24 DIAGNOSIS — G8929 Other chronic pain: Secondary | ICD-10-CM | POA: Diagnosis not present

## 2023-05-31 ENCOUNTER — Encounter: Payer: Self-pay | Admitting: Oncology

## 2023-06-01 ENCOUNTER — Ambulatory Visit: Payer: Medicare HMO

## 2023-06-05 ENCOUNTER — Inpatient Hospital Stay: Payer: Medicare HMO

## 2023-06-05 ENCOUNTER — Inpatient Hospital Stay: Payer: Medicare HMO | Admitting: Oncology

## 2023-06-14 ENCOUNTER — Ambulatory Visit (INDEPENDENT_AMBULATORY_CARE_PROVIDER_SITE_OTHER): Payer: Medicare HMO

## 2023-06-14 ENCOUNTER — Ambulatory Visit (INDEPENDENT_AMBULATORY_CARE_PROVIDER_SITE_OTHER): Payer: Medicare HMO | Admitting: Nurse Practitioner

## 2023-06-14 ENCOUNTER — Encounter (INDEPENDENT_AMBULATORY_CARE_PROVIDER_SITE_OTHER): Payer: Self-pay | Admitting: Nurse Practitioner

## 2023-06-14 VITALS — BP 87/56 | HR 81 | Resp 18 | Ht 60.0 in | Wt 97.0 lb

## 2023-06-14 DIAGNOSIS — I6523 Occlusion and stenosis of bilateral carotid arteries: Secondary | ICD-10-CM

## 2023-06-14 DIAGNOSIS — E782 Mixed hyperlipidemia: Secondary | ICD-10-CM

## 2023-06-14 DIAGNOSIS — N184 Chronic kidney disease, stage 4 (severe): Secondary | ICD-10-CM

## 2023-06-14 DIAGNOSIS — I10 Essential (primary) hypertension: Secondary | ICD-10-CM | POA: Diagnosis not present

## 2023-06-15 NOTE — Progress Notes (Signed)
 Subjective:    Patient ID: [name], female    DOB: Jun 14, 1945, 78 y.o.   MRN: [id number] Chief Complaint  Patient presents with   Follow-up    f/u in 1 year with carotid    The patient is seen for follow up evaluation of carotid stenosis. The carotid stenosis followed by ultrasound.   The patient denies amaurosis fugax. There is no recent history of TIA symptoms or focal motor deficits. There is no prior documented CVA.  The patient is taking enteric-coated aspirin  325 mg daily and Plavix   There is no history of migraine headaches. There is no history of seizures.  No recent shortening of the patient's walking distance or new symptoms consistent with claudication.  No history of rest pain symptoms. No new ulcers or wounds of the lower extremities have occurred.  There is no history of DVT, PE or superficial thrombophlebitis. No documented recent episodes of angina or shortness of breath documented.   Carotid Duplex done today shows 40 to 59% stenosis of the right ICA and 60 to 79% stenosis of the left ICA.  While the patient still remains in the same brackets her velocities are increased in the left ICA    Review of Systems  All other systems reviewed and are negative.      Objective:   Physical Exam Vitals reviewed.  HENT:     Head: Normocephalic.  Neck:     Vascular: Carotid bruit present.  Cardiovascular:     Rate and Rhythm: Normal rate.  Pulmonary:     Effort: Pulmonary effort is normal.  Skin:    General: Skin is warm and dry.  Neurological:     Mental Status: She is alert and oriented to person, place, and time.  Psychiatric:        Mood and Affect: Mood normal.        Behavior: Behavior normal.        Thought Content: Thought content normal.        Judgment: Judgment normal.     BP (!) 87/56   Pulse 81   Resp 18   Ht 5' (1.524 m)   Wt 97 lb (44 kg)   LMP 11/03/1980 Comment: hysterectomy  BMI 18.94 kg/m   Past Medical History:   Diagnosis Date   Acid reflux    Anemia    CAD (coronary artery disease)    Chronic constipation    Chronic kidney disease    stage 3 chronic kidney disease   Eczema    H/O heart artery stent    Hyperlipidemia    Hypertension    IBS (irritable bowel syndrome) 2004   Migraine    Mitral valve prolapse    Peptic ulcer 1989   Skin cancer     Social History   Socioeconomic History   Marital status: Married    Spouse name: Not on file   Number of children: Not on file   Years of education: Not on file   Highest education level: Not on file  Occupational History   Not on file  Tobacco Use   Smoking status: Never    Passive exposure: Never   Smokeless tobacco: Never  Vaping Use   Vaping status: Never Used  Substance and Sexual Activity   Alcohol use: No   Drug use: No   Sexual activity: Yes    Birth control/protection: Surgical  Other Topics Concern   Not on file  Social History Narrative   Not  on file   Social Drivers of Health   Financial Resource Strain: Low Risk  (06/12/2023)   Received from [facility]   Overall Financial Resource Strain (CARDIA)    Difficulty of Paying Living Expenses: Not hard at all  Food Insecurity: No Food Insecurity (06/12/2023)   Received from [facility]   Hunger Vital Sign    Worried About Running Out of Food in the Last Year: Never true    Ran Out of Food in the Last Year: Never true  Transportation Needs: No Transportation Needs (06/12/2023)   Received from [facility] - Transportation    In the past 12 months, has lack of transportation kept you from medical appointments or from getting medications?: No    Lack of Transportation (Non-Medical): No  Physical Activity: Not on file  Stress: Not on file  Social Connections: Not on file  Intimate Partner Violence: Not on file    Past Surgical History:  Procedure Laterality Date   ABDOMINAL HYSTERECTOMY     APPENDECTOMY      BREAST BIOPSY Left 07/14/2021   left stereo bx ribbon clip path stromal fibrosis   CARDIAC CATHETERIZATION N/A 10/21/2015   Procedure: Left Heart Cath and Coronary Angiography;  Surgeon: Marsa Dooms, MD;  Location: [facility];  Service: Cardiovascular;  Laterality: N/A;   CARDIAC CATHETERIZATION N/A 10/21/2015   Procedure: Coronary Stent Intervention;  Surgeon: Marsa Dooms, MD;  Location: [facility];  Service: Cardiovascular;  Laterality: N/A;   CARDIAC CATHETERIZATION Left 11/24/2015   Procedure: Coronary Stent Intervention;  Surgeon: Marsa Dooms, MD;  Location: [facility];  Service: Cardiovascular;  Laterality: Left;   CARDIAC SURGERY  2007   cardiac stent place   CATARACT EXTRACTION  2012   CHOLECYSTECTOMY     CORONARY ANGIOPLASTY WITH STENT PLACEMENT  2013   CORONARY ARTERY BYPASS GRAFT  2008   CORONARY STENT INTERVENTION N/A 07/04/2017   Procedure: CORONARY STENT INTERVENTION;  Surgeon: Dooms Marsa, MD;  Location: [facility];  Service: Cardiovascular;  Laterality: N/A;   CORONARY STENT INTERVENTION N/A 08/08/2017   Procedure: CORONARY STENT INTERVENTION;  Surgeon: Dooms Marsa, MD;  Location: [facility];  Service: Cardiovascular;  Laterality: N/A;   CYSTOSCOPY W/ RETROGRADES Bilateral 12/29/2014   Procedure: CYSTOSCOPY WITH RETROGRADE PYELOGRAM;  Surgeon: Charlie JONETTA Pack, MD;  Location: [facility];  Service: Urology;  Laterality: Bilateral;   CYSTOSCOPY WITH BIOPSY N/A 12/29/2014   Procedure: CYSTOSCOPY WITH BIOPSY;  Surgeon: Charlie JONETTA Pack, MD;  Location: [facility];  Service: Urology;  Laterality: N/A;   HAND SURGERY  1998   LEFT HEART CATH AND CORONARY ANGIOGRAPHY Left 08/08/2017   Procedure: LEFT HEART CATH AND CORONARY ANGIOGRAPHY;  Surgeon: Dooms Marsa, MD;  Location: [facility];  Service: Cardiovascular;  Laterality: Left;   LEFT HEART CATH AND CORONARY ANGIOGRAPHY Left  08/01/2019   Procedure: LEFT HEART CATH AND CORONARY ANGIOGRAPHY;  Surgeon: Dooms Marsa, MD;  Location: [facility];  Service: Cardiovascular;  Laterality: Left;   LEFT HEART CATH AND CORS/GRAFTS ANGIOGRAPHY N/A 07/04/2017   Procedure: LEFT HEART CATH AND CORS/GRAFTS ANGIOGRAPHY;  Surgeon: Dooms Marsa, MD;  Location: [facility];  Service: Cardiovascular;  Laterality: N/A;   SIGMOID RESECTION / RECTOPEXY  2006   SKIN CANCER EXCISION  2013   TENDON REPAIR     right elbow   TRIGGER FINGER RELEASE  Family History  Problem Relation Age of Onset   Kidney Stones Father    Prostate cancer Father    Heart disease Father    Kidney Stones Brother    Heart disease Mother     Allergies  Allergen Reactions   Prochlorperazine Nausea And Vomiting, Other (See Comments) and Nausea Only    Severe vomiting   Ezetimibe Other (See Comments)    Muscle Pain   Statins Other (See Comments)    Muscle Pain   Tape Other (See Comments)    Pulls my skin off when the tape is removed. Use paper tape only.   Cephalexin Rash   Metoclopramide Rash and Other (See Comments)    Elevates BP, face draws to one side   Penicillins Rash and Other (See Comments)    Has patient had a PCN reaction causing immediate rash, facial/tongue/throat swelling, SOB or lightheadedness with hypotension: Yes Has patient had a PCN reaction causing severe rash involving mucus membranes or skin necrosis: Yes Has patient had a PCN reaction that required hospitalization No Has patient had a PCN reaction occurring within the last 10 years: No If all of the above answers are NO, then may proceed with Cephalosporin use.        Latest Ref Rng & Units 05/08/2023    9:42 AM 04/03/2023   10:13 AM 02/28/2023   11:34 AM  CBC  WBC 4.0 - 10.5 K/uL 7.4  9.2  6.1   Hemoglobin 12.0 - 15.0 g/dL 7.9  89.6  9.5   Hematocrit 36.0 - 46.0 % 25.6  32.8  29.9   Platelets 150 - 400 K/uL 296  310  279        CMP     Component Value Date/Time   NA 138 09/20/2020 1049   NA 137 05/24/2013 0357   K 3.6 09/20/2020 1049   K 2.3 (LL) 05/24/2013 0357   CL 112 (H) 09/20/2020 1049   CL 97 (L) 05/24/2013 0357   CO2 18 (L) 09/20/2020 1049   CO2 36 (H) 05/24/2013 0357   GLUCOSE 93 09/20/2020 1049   GLUCOSE 91 05/24/2013 0357   BUN 33 (H) 09/20/2020 1049   BUN 21 (H) 05/24/2013 0357   CREATININE 2.93 (H) 09/20/2020 1049   CREATININE 1.32 (H) 05/24/2013 0357   CALCIUM 8.3 (L) 09/20/2020 1049   CALCIUM 7.9 (L) 05/24/2013 0357   PROT 6.1 (L) 09/20/2020 1049   ALBUMIN 3.6 09/20/2020 1049   AST 16 09/20/2020 1049   ALT 8 09/20/2020 1049   ALKPHOS 63 09/20/2020 1049   BILITOT 0.6 09/20/2020 1049   GFRNONAA 16 (L) 09/20/2020 1049   GFRNONAA 42 (L) 05/24/2013 0357     No results found.     Assessment & Plan:   1. Bilateral carotid artery stenosis (Primary) Today the patient's studies indicate 60 to 79% stenosis in her left ICA however the velocities indicate that she is likely on the higher end closer to 75% or so.  However given the patient's decreased kidney function she would not be able to undergo a CT scan.  Typically in these instances we will have the patient undergo angiogram with possible treatment but her GFR is low enough that this too may also push the patient into end-stage renal disease.  Based on this and the fact that the patient does not have any symptoms at this time, we will continue to manage medically with her Plavix  and aspirin .  Will maintain very close follow-up and have her  return in 3 months.  2. Primary hypertension Continue antihypertensive medications as already ordered, these medications have been reviewed and there are no changes at this time.  3. Mixed hyperlipidemia Continue statin as ordered and reviewed, no changes at this time  4. CKD (chronic kidney disease) stage 4, GFR 15-29 ml/min (HCC) Patient's most recent lab work reveals GFR at down to 11 with  a creatinine of 4.1.  Based on these we will try to avoid contrast dye is much as possible as noted above.   Current Outpatient Medications on File Prior to Visit  Medication Sig Dispense Refill   acetaminophen  (TYLENOL ) 325 MG tablet Take 650 mg by mouth every 6 (six) hours as needed (for pain.).     allopurinol  (ZYLOPRIM ) 100 MG tablet Take 100 mg by mouth daily with lunch.      aspirin  325 MG tablet Take 325 mg by mouth daily with lunch.      AZO-CRANBERRY PO Take 1 capsule by mouth daily with lunch.      calcitRIOL (ROCALTROL) 0.25 MCG capsule 1 capsule once daily     CINNAMON PO Take 1,000 mg by mouth.     clindamycin (CLEOCIN) 300 MG capsule Take by mouth.     clopidogrel  (PLAVIX ) 75 MG tablet Take 75 mg by mouth daily with lunch.      co-enzyme Q-10 30 MG capsule Take 30 mg by mouth daily.     cyclobenzaprine (FLEXERIL) 5 MG tablet Take by mouth.     ergocalciferol (VITAMIN D2) 1.25 MG (50000 UT) capsule Take by mouth.     furosemide  (LASIX ) 40 MG tablet Take 20 mg by mouth once a week. Every other day with lunch     gabapentin (NEURONTIN) 100 MG capsule Take 100 mg by mouth at bedtime.      Garlic 1000 MG CAPS Take by mouth.     hyoscyamine (LEVSIN SL) 0.125 MG SL tablet Take 0.125 mg by mouth daily.     isosorbide  mononitrate (IMDUR ) 60 MG 24 hr tablet Take 60 mg by mouth daily.      MAGNESIUM CITRATE PO Take 100 mg by mouth daily.      metoprolol  succinate (TOPROL -XL) 25 MG 24 hr tablet Take 25 mg by mouth daily with lunch. 1/2 tab QD     nitroGLYCERIN  (NITROSTAT ) 0.4 MG SL tablet Place 0.4 mg under the tongue as needed for chest pain.     ondansetron  (ZOFRAN -ODT) 4 MG disintegrating tablet Take 4 mg by mouth 3 (three) times daily as needed.     pantoprazole  (PROTONIX ) 40 MG tablet Take 40 mg by mouth daily before breakfast.      potassium chloride  (KLOR-CON  M) 10 MEQ tablet Take by mouth.     potassium chloride  (MICRO-K ) 10 MEQ CR capsule Take 20 mEq by mouth in the morning and  at bedtime.      promethazine  (PHENERGAN ) 25 MG tablet Take 25 mg by mouth every 8 (eight) hours as needed for nausea or vomiting.      ranolazine (RANEXA) 500 MG 12 hr tablet Take by mouth.     senna (SENOKOT) 8.6 MG tablet Take 4-6 tablets by mouth at bedtime.      sodium bicarbonate 650 MG tablet Take 650 mg by mouth daily with lunch.     vitamin E 400 UNIT capsule Take 400 Units by mouth daily with lunch.      riboflavin (VITAMIN B-2) 100 MG TABS tablet Take 100 mg by mouth daily with  lunch.  (Patient not taking: Reported on 01/30/2023)     No current facility-administered medications on file prior to visit.    There are no Patient Instructions on file for this visit. No follow-ups on file.   Alanis Clift E Evalyne Cortopassi, NP

## 2023-07-17 ENCOUNTER — Ambulatory Visit
Admission: RE | Admit: 2023-07-17 | Discharge: 2023-07-17 | Disposition: A | Discharge status: Home / Self Care | Payer: Medicare HMO | Source: Ambulatory Visit | Attending: Neurosurgery | Admitting: Neurosurgery

## 2023-07-17 DIAGNOSIS — M5441 Lumbago with sciatica, right side: Secondary | ICD-10-CM | POA: Diagnosis present

## 2023-07-17 DIAGNOSIS — M4317 Spondylolisthesis, lumbosacral region: Secondary | ICD-10-CM | POA: Diagnosis present

## 2023-07-17 DIAGNOSIS — R29898 Other symptoms and signs involving the musculoskeletal system: Secondary | ICD-10-CM | POA: Insufficient documentation

## 2023-07-17 DIAGNOSIS — M5442 Lumbago with sciatica, left side: Secondary | ICD-10-CM | POA: Diagnosis present

## 2023-07-17 DIAGNOSIS — G8929 Other chronic pain: Secondary | ICD-10-CM | POA: Insufficient documentation

## 2023-07-17 DIAGNOSIS — M79604 Pain in right leg: Secondary | ICD-10-CM | POA: Diagnosis present

## 2023-07-25 ENCOUNTER — Ambulatory Visit
Admission: RE | Admit: 2023-07-25 | Discharge: 2023-07-25 | Disposition: A | Discharge status: Home / Self Care | Source: Ambulatory Visit | Attending: Neurosurgery | Admitting: Neurosurgery

## 2023-07-25 DIAGNOSIS — R29898 Other symptoms and signs involving the musculoskeletal system: Secondary | ICD-10-CM | POA: Diagnosis not present

## 2023-07-25 DIAGNOSIS — M4317 Spondylolisthesis, lumbosacral region: Secondary | ICD-10-CM

## 2023-07-25 DIAGNOSIS — M5442 Lumbago with sciatica, left side: Secondary | ICD-10-CM | POA: Diagnosis not present

## 2023-07-25 DIAGNOSIS — M810 Age-related osteoporosis without current pathological fracture: Secondary | ICD-10-CM | POA: Diagnosis not present

## 2023-07-25 DIAGNOSIS — Z780 Asymptomatic menopausal state: Secondary | ICD-10-CM | POA: Diagnosis not present

## 2023-07-25 DIAGNOSIS — Z90722 Acquired absence of ovaries, bilateral: Secondary | ICD-10-CM | POA: Insufficient documentation

## 2023-07-25 DIAGNOSIS — M5441 Lumbago with sciatica, right side: Secondary | ICD-10-CM | POA: Diagnosis present

## 2023-07-25 DIAGNOSIS — M79604 Pain in right leg: Secondary | ICD-10-CM | POA: Diagnosis present

## 2023-07-25 DIAGNOSIS — G8929 Other chronic pain: Secondary | ICD-10-CM

## 2023-08-03 ENCOUNTER — Encounter: Payer: Self-pay | Admitting: Neurosurgery

## 2023-08-03 ENCOUNTER — Other Ambulatory Visit: Payer: Self-pay | Admitting: Neurosurgery

## 2023-08-03 DIAGNOSIS — M4317 Spondylolisthesis, lumbosacral region: Secondary | ICD-10-CM

## 2023-08-03 DIAGNOSIS — M818 Other osteoporosis without current pathological fracture: Secondary | ICD-10-CM

## 2023-08-14 ENCOUNTER — Other Ambulatory Visit: Payer: Medicare HMO

## 2023-08-18 ENCOUNTER — Encounter: Payer: Self-pay | Admitting: Neurosurgery

## 2023-08-28 ENCOUNTER — Ambulatory Visit: Admitting: Orthopedic Surgery

## 2023-08-30 ENCOUNTER — Ambulatory Visit: Admitting: Orthopedic Surgery

## 2023-08-30 ENCOUNTER — Other Ambulatory Visit (INDEPENDENT_AMBULATORY_CARE_PROVIDER_SITE_OTHER): Payer: Self-pay

## 2023-08-30 VITALS — BP 144/69 | HR 83 | Ht 60.0 in | Wt 102.0 lb

## 2023-08-30 DIAGNOSIS — M810 Age-related osteoporosis without current pathological fracture: Secondary | ICD-10-CM

## 2023-08-30 DIAGNOSIS — G8929 Other chronic pain: Secondary | ICD-10-CM

## 2023-08-30 DIAGNOSIS — M545 Low back pain, unspecified: Secondary | ICD-10-CM

## 2023-08-30 MED ORDER — HYDROCODONE-ACETAMINOPHEN 5-325 MG PO TABS: 1.0000 | ORAL_TABLET | Freq: Four times a day (QID) | ORAL | 0 refills | Status: AC | PRN

## 2023-08-30 NOTE — Progress Notes (Signed)
 Orthopedic Spine Surgery Office Note  Assessment: Patient is a 78 y.o. female with low back pain that radiates into the bilateral posterior thighs and buttocks.  Has not numbness and paresthesias along the lateral aspect of the leg into the feet.  Has central stenosis and bilateral foraminal stenosis associated with a spondylolisthesis at L5/S1   Plan: -Explained that initially conservative treatment is tried as a significant number of patients may experience relief with these treatment modalities. Discussed that the conservative treatments include:  -activity modification  -physical therapy  -over the counter pain medications  -medrol dosepak  -lumbar steroid injections -Patient has osteoporosis with a T-score of -3.5 so she is not a candidate for any kind of fusion surgery.  Provided her with a referral to osteoporosis clinic. She also reports having significant cardiac surgery and doubts she would get cardiac clearance. Right now, I told her we need to focus on the osteoporosis to try to prevent future fractures and optimize her bone health in case we ever do decide on surgery -She has not gotten pain relief with any treatments tried so far.  Prescribed Norco and referred her to pain management -Patient has tried PT, Tylenol , tramadol , gabapentin -Patient should return to office in 8 weeks, x-rays at next visit: None   Patient expressed understanding of the plan and all questions were answered to the patient's satisfaction.   ___________________________________________________________________________   History:  Patient is a 78 y.o. female who presents today for lumbar spine.  Patient has had about a year of low back pain that radiates into the bilateral posterior thighs.  She feels a going into the buttocks and posterior aspect of the thighs on both sides.  She notes the pain with activity and at rest.  She also has noticed numbness particular in the right leg that goes into the lateral  leg into the foot.  She said she is within the last couple weeks started develop the same numbness and paresthesias in the left leg.  She has had difficulty with balance as her right leg will give out on her.  She said she has had a couple of falls as a result of the leg giving out.  She has just purchased a walker and is going to start using that.  She is supposed to be getting a surgery for peritoneal dialysis.  She also said she has a significant carotid artery stenosis and is getting that evaluated.  She told me that her heart surgeon is not recommending any elective surgeries due to her cardiac status.   Weakness: Yes, right leg feels weak at time and gives out Symptoms of imbalance: Yes, when right leg gives out.  Has resulted in falls Paresthesias and numbness: Yes, has numbness and paresthesias along the lateral aspect of the legs and bilateral feet.  No other numbness or paresthesias Bowel or bladder incontinence: Denies Saddle anesthesia: Denies  Treatments tried: PT, Tylenol , tramadol , gabapentin  Review of systems: Denies fevers and chills, night sweats, unexplained weight loss, history of cancer.  Has had pain that wakes her at night  Past medical history: HLD CAD CKD GERD Osteoporosis IBS Vertigo Chronic pain  Allergies: Penicillin, Keflex, Reglan, Compazine, Statins, Ezetimibe  Past surgical history:  CABG Carpal tunnel release Sigmoidectomy Cardiac stents Cholecystectomy Hysterectomy Trigger finger release Appendectomy Cataract surgery  Social history: Denies use of nicotine product (smoking, vaping, patches, smokeless) Alcohol use: denies Denies recreational drug use   Physical Exam:  BMI of 19.9  General: no acute  distress, appears stated age Neurologic: alert, answering questions appropriately, following commands Respiratory: unlabored breathing on room air, symmetric chest rise Psychiatric: appropriate affect, normal cadence to speech   MSK  (spine):  -Strength exam      Left  Right EHL    4/5  4/5 TA    5/5  5/5 GSC    5/5  5/5 Knee extension  5/5  5/5 Hip flexion   5/5  5/5  -Sensory exam    Sensation intact to light touch in L3-S1 nerve distributions of bilateral lower extremities  -Achilles DTR: 1/4 on the left, 1/4 on the right -Patellar tendon DTR: 2/4 on the left, 2/4 on the right  -Straight leg raise: Negative bilaterally -Femoral nerve stretch test: Negative bilaterally -Clonus: no beats bilaterally  -Left hip exam: No pain through range of motion -Right hip exam: No pain through range of motion  Imaging: XRs of the lumbar spine from 08/30/2023 were independently reviewed and interpreted, showing grade 2 spondylolisthesis at L5/S1. Disc height loss at L5/S1. No other significant degenerative changes noted. No fracture or dislocation seen.  MRI of the lumbar spine from 02/15/2023 was independently reviewed and interpreted, showing spondylolisthesis at L5/S1 with bilateral foraminal stenosis and central stenosis. No other significant stenosis seen.    Patient name: [name] Patient MRN: [id number] Date of visit: 08/30/23

## 2023-09-05 ENCOUNTER — Other Ambulatory Visit (INDEPENDENT_AMBULATORY_CARE_PROVIDER_SITE_OTHER): Payer: Self-pay | Admitting: Nurse Practitioner

## 2023-09-05 DIAGNOSIS — I6523 Occlusion and stenosis of bilateral carotid arteries: Secondary | ICD-10-CM

## 2023-09-10 NOTE — Progress Notes (Signed)
 MRN : [id number]  [name] is a 78 y.o. (07-Dec-1945) female who presents with chief complaint of check carotid arteries.  History of Present Illness:  The patient is seen for follow up evaluation of carotid stenosis. The carotid stenosis followed by ultrasound.    The patient denies amaurosis fugax. There is no recent history of TIA symptoms or focal motor deficits. There is no prior documented CVA.   The patient is taking enteric-coated aspirin  325 mg daily and Plavix    There is no history of migraine headaches. There is no history of seizures.   No recent shortening of the patient's walking distance or new symptoms consistent with claudication.  No history of rest pain symptoms. No new ulcers or wounds of the lower extremities have occurred.   There is no history of DVT, PE or superficial thrombophlebitis. No documented recent episodes of angina or shortness of breath documented.    Carotid Duplex done today shows 1-39% stenosis of the right ICA and 40-59% stenosis of the left ICA.    No outpatient medications have been marked as taking for the 09/11/23 encounter (Appointment) with Prescilla Brod, Ninette Basque, MD.    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   CARDIAC CATHETERIZATION N/A 10/21/2015   Procedure: Left Heart Cath and Coronary Angiography;  Surgeon: Percival Brace, MD;  Location: [facility];  Service: Cardiovascular;   Laterality: N/A;   CARDIAC CATHETERIZATION N/A 10/21/2015   Procedure: Coronary Stent Intervention;  Surgeon: Percival Brace, MD;  Location: [facility];  Service: Cardiovascular;  Laterality: N/A;   CARDIAC CATHETERIZATION Left 11/24/2015   Procedure: Coronary Stent Intervention;  Surgeon: Percival Brace, MD;  Location: [facility];  Service: Cardiovascular;  Laterality: Left;   CARDIAC SURGERY  2007   cardiac stent place   CATARACT EXTRACTION  2012   CHOLECYSTECTOMY     CORONARY ANGIOPLASTY WITH STENT PLACEMENT  2013   CORONARY ARTERY BYPASS GRAFT  2008   CORONARY STENT INTERVENTION N/A 07/04/2017   Procedure: CORONARY STENT INTERVENTION;  Surgeon: Percival Brace, MD;  Location: [facility];  Service: Cardiovascular;  Laterality: N/A;   CORONARY STENT INTERVENTION N/A 08/08/2017   Procedure: CORONARY STENT INTERVENTION;  Surgeon: Percival Brace, MD;  Location: [facility];  Service: Cardiovascular;  Laterality: N/A;   CYSTOSCOPY W/ RETROGRADES Bilateral 12/29/2014   Procedure: CYSTOSCOPY WITH RETROGRADE PYELOGRAM;  Surgeon: Michelina Aho, MD;  Location: [facility];  Service: Urology;  Laterality: Bilateral;   CYSTOSCOPY WITH BIOPSY N/A 12/29/2014   Procedure: CYSTOSCOPY WITH BIOPSY;  Surgeon: Michelina Aho, MD;  Location: [facility];  Service: Urology;  Laterality: N/A;   HAND  SURGERY  1998   LEFT HEART CATH AND CORONARY ANGIOGRAPHY Left 08/08/2017   Procedure: LEFT HEART CATH AND CORONARY ANGIOGRAPHY;  Surgeon: Percival Brace, MD;  Location: [facility];  Service: Cardiovascular;  Laterality: Left;   LEFT HEART CATH AND CORONARY ANGIOGRAPHY Left 08/01/2019   Procedure: LEFT HEART CATH AND CORONARY ANGIOGRAPHY;  Surgeon: Percival Brace, MD;  Location: [facility];  Service: Cardiovascular;  Laterality: Left;   LEFT HEART CATH AND CORS/GRAFTS ANGIOGRAPHY N/A 07/04/2017   Procedure: LEFT HEART CATH AND  CORS/GRAFTS ANGIOGRAPHY;  Surgeon: Percival Brace, MD;  Location: [facility];  Service: Cardiovascular;  Laterality: N/A;   SIGMOID RESECTION / RECTOPEXY  2006   SKIN CANCER EXCISION  2013   TENDON REPAIR     right elbow   TRIGGER FINGER RELEASE      Social History Social History   Tobacco Use   Smoking status: Never    Passive exposure: Never   Smokeless tobacco: Never  Vaping Use   Vaping status: Never Used  Substance Use Topics   Alcohol use: No   Drug use: No    Family History Family History  Problem Relation Age of Onset   Kidney Stones Father    Prostate cancer Father    Heart disease Father    Kidney Stones Brother    Heart disease Mother     Allergies  Allergen Reactions   Prochlorperazine Nausea And Vomiting, Other (See Comments) and Nausea Only    Severe vomiting   Ezetimibe Other (See Comments)    Muscle Pain   Statins Other (See Comments)    Muscle Pain   Tape Other (See Comments)    Pulls my skin off when the tape is removed. Use paper tape only.   Cephalexin Rash   Metoclopramide Rash and Other (See Comments)    Elevates BP, face draws to one side   Penicillins Rash and Other (See Comments)    Has patient had a PCN reaction causing immediate rash, facial/tongue/throat swelling, SOB or lightheadedness with hypotension: Yes Has patient had a PCN reaction causing severe rash involving mucus membranes or skin necrosis: Yes Has patient had a PCN reaction that required hospitalization No Has patient had a PCN reaction occurring within the last 10 years: No If all of the above answers are "NO", then may proceed with Cephalosporin use.      REVIEW OF SYSTEMS (Negative unless checked)  Constitutional: [] Weight loss  [] Fever  [] Chills Cardiac: [] Chest pain   [] Chest pressure   [] Palpitations   [] Shortness of breath when laying flat   [] Shortness of breath with exertion. Vascular:  [x] Pain in legs with walking   [] Pain in legs at rest   [] History of DVT   [] Phlebitis   [] Swelling in legs   [] Varicose veins   [] Non-healing ulcers Pulmonary:   [] Uses home oxygen   [] Productive cough   [] Hemoptysis   [] Wheeze  [] COPD   [] Asthma Neurologic:  [] Dizziness   [] Seizures   [] History of stroke   [] History of TIA  [] Aphasia   [] Vissual changes   [] Weakness or numbness in arm   [] Weakness or numbness in leg Musculoskeletal:   [] Joint swelling   [] Joint pain   [] Low back pain Hematologic:  [] Easy bruising  [] Easy bleeding   [] Hypercoagulable state   [] Anemic Gastrointestinal:  [] Diarrhea   [] Vomiting  [] Gastroesophageal reflux/heartburn   [] Difficulty swallowing. Genitourinary:  [] Chronic kidney disease   [] Difficult urination  [] Frequent urination   [] Blood  in urine Skin:  [] Rashes   [] Ulcers  Psychological:  [] History of anxiety   []  History of major depression.  Physical Examination  There were no vitals filed for this visit. There is no height or weight on file to calculate BMI. Gen: WD/WN, NAD Head: [place]/AT, No temporalis wasting.  Ear/Nose/Throat: Hearing grossly intact, nares w/o erythema or drainage Eyes: PER, EOMI, sclera nonicteric.  Neck: Supple, no masses.  No bruit or JVD.  Pulmonary:  Good air movement, no audible wheezing, no use of accessory muscles.  Cardiac: RRR, normal S1, S2, no Murmurs. Vascular:  carotid bruit noted Vessel Right Left  Radial Palpable Palpable  Carotid  Palpable  Palpable  Gastrointestinal: soft, non-distended. No guarding/no peritoneal signs.  Musculoskeletal: M/S 5/5 throughout.  No visible deformity.  Neurologic: CN 2-12 intact. Pain and light touch intact in extremities.  Symmetrical.  Speech is fluent. Motor exam as listed above. Psychiatric: Judgment intact, Mood & affect appropriate for pt's clinical situation. Dermatologic: No rashes or ulcers noted.  No changes consistent with cellulitis.   CBC Lab Results  Component Value Date   WBC 7.4 05/08/2023   HGB 7.9 (L) 05/08/2023   HCT  25.6 (L) 05/08/2023   MCV 97.0 05/08/2023   PLT 296 05/08/2023    BMET    Component Value Date/Time   NA 138 09/20/2020 1049   NA 137 05/24/2013 0357   K 3.6 09/20/2020 1049   K 2.3 (LL) 05/24/2013 0357   CL 112 (H) 09/20/2020 1049   CL 97 (L) 05/24/2013 0357   CO2 18 (L) 09/20/2020 1049   CO2 36 (H) 05/24/2013 0357   GLUCOSE 93 09/20/2020 1049   GLUCOSE 91 05/24/2013 0357   BUN 33 (H) 09/20/2020 1049   BUN 21 (H) 05/24/2013 0357   CREATININE 2.93 (H) 09/20/2020 1049   CREATININE 1.32 (H) 05/24/2013 0357   CALCIUM 8.3 (L) 09/20/2020 1049   CALCIUM 7.9 (L) 05/24/2013 0357   GFRNONAA 16 (L) 09/20/2020 1049   GFRNONAA 42 (L) 05/24/2013 0357   GFRAA 39 (L) 08/09/2017 0409   GFRAA 49 (L) 05/24/2013 0357   CrCl cannot be calculated (Patient's most recent lab result is older than the maximum 21 days allowed.).  COAG Lab Results  Component Value Date   INR 1.5 12/04/2006   INR 0.9 11/30/2006    Radiology XR Lumbar Spine Complete Result Date: 08/30/2023 XRs of the lumbar spine from 08/30/2023 were independently reviewed and interpreted, showing grade 2 spondylolisthesis at L5/S1. Disc height loss at L5/S1. No other significant degenerative changes noted. No fracture or dislocation seen.    Assessment/Plan 1. Bilateral carotid artery stenosis (Primary) Recommend:  Given the patient's asymptomatic subcritical stenosis no further invasive testing or surgery at this time.  Duplex ultrasound shows 1-39% stenosis of the right ICA and 40-59% stenosis of the left ICA  Continue antiplatelet therapy as prescribed Continue management of CAD, HTN and Hyperlipidemia Healthy heart diet,  encouraged exercise at least 4 times per week  Follow up in 6 months with duplex ultrasound and physical exam  - VAS US  CAROTID; Future  2. Renal artery stenosis (HCC) Recommend:  Given patient's arterial disease optimal control of the patient's hypertension is important. BP is acceptable  today  The patient's vital signs and noninvasive studies support the renal artery stenosis is not significantly increased when compared to the previous study.  No invasive studies or intervention is indicated at this time.  The patient will continue the current antihypertensive medications, no  changes at this time.  The primary medical service will continue aggressive antihypertensive therapy as per the AHA guidelines.  Patient will follow-up with duplex ultrasound of the renal arteries as ordered.   3. Primary hypertension Continue antihypertensive medications as already ordered, these medications have been reviewed and there are no changes at this time.  4. Coronary artery disease of native artery of native heart with stable angina pectoris (HCC) Continue cardiac and antihypertensive medications as already ordered and reviewed, no changes at this time.  Continue statin as ordered and reviewed, no changes at this time  Nitrates PRN for chest pain  5. CKD (chronic kidney disease) stage 4, GFR 15-29 ml/min (HCC) The patient has advanced renal disease.  However, at the present time the patient is not yet on dialysis.  Avoid nephrotoxic medications and dehydration.  Further plans per nephrology    Devon Fogo, MD  09/10/2023 3:02 PM

## 2023-09-11 ENCOUNTER — Ambulatory Visit (INDEPENDENT_AMBULATORY_CARE_PROVIDER_SITE_OTHER): Payer: Medicare HMO

## 2023-09-11 ENCOUNTER — Ambulatory Visit (INDEPENDENT_AMBULATORY_CARE_PROVIDER_SITE_OTHER): Payer: Medicare HMO | Admitting: Vascular Surgery

## 2023-09-11 ENCOUNTER — Encounter (INDEPENDENT_AMBULATORY_CARE_PROVIDER_SITE_OTHER): Payer: Self-pay | Admitting: Vascular Surgery

## 2023-09-11 VITALS — BP 128/75 | HR 75 | Ht 59.5 in | Wt 101.4 lb

## 2023-09-11 DIAGNOSIS — I6523 Occlusion and stenosis of bilateral carotid arteries: Secondary | ICD-10-CM

## 2023-09-11 DIAGNOSIS — I10 Essential (primary) hypertension: Secondary | ICD-10-CM

## 2023-09-11 DIAGNOSIS — I25118 Atherosclerotic heart disease of native coronary artery with other forms of angina pectoris: Secondary | ICD-10-CM

## 2023-09-11 DIAGNOSIS — I701 Atherosclerosis of renal artery: Secondary | ICD-10-CM

## 2023-09-11 DIAGNOSIS — N184 Chronic kidney disease, stage 4 (severe): Secondary | ICD-10-CM

## 2023-09-17 ENCOUNTER — Encounter (INDEPENDENT_AMBULATORY_CARE_PROVIDER_SITE_OTHER): Payer: Self-pay | Admitting: Vascular Surgery

## 2023-09-20 ENCOUNTER — Encounter: Payer: Self-pay | Admitting: Oncology

## 2023-09-20 ENCOUNTER — Ambulatory Visit: Admitting: Physician Assistant

## 2023-09-20 ENCOUNTER — Encounter: Payer: Self-pay | Admitting: Physician Assistant

## 2023-09-20 DIAGNOSIS — M810 Age-related osteoporosis without current pathological fracture: Secondary | ICD-10-CM | POA: Insufficient documentation

## 2023-09-20 NOTE — Progress Notes (Signed)
 Office Visit Note   Patient: [name]           Date of Birth: April 24, 1946           MRN: [id number] Visit Date: 09/20/2023              Requested by: Diedra Fowler, MD [address] [place],  [place] [address] PCP: Antonio Baumgarten, MD   Assessment & Plan: Visit Diagnoses:  1. Age-related osteoporosis without current pathological fracture     Plan: [name] is a pleasant 78 year old woman who presents today with her sister-in-law.  She was referred by Dr. Sulema Endo for evaluation of osteoporosis.  She has never had a fracture in her hip spine or wrist although she does admit she has lost some height.  She has not taking any osteoporosis medications in the past.  She does have a history of heart disease and heart attack in the past year.  She has had a history of skin cancer.  She does have advanced stage kidney disease though is not on dialysis.  She has no history of ulcers never had bypass surgery does not have reflux no history of seizures.  She did undergo menopause at 38 and says that she did hormone replacements for 10 to 15 years following that.  She does not take any calcium but does take a vitamin D.  Because of her kidney disease her calcium is monitored by her nephrologist.  She is never been a smoker she does not drink currently she does not do any exercise she has had no dental work in the past year and does not have a family history of fragility fractures.  She does have a BMI of 16.  Her most recent bone density scan was -3.5.  Using FRAX scoring she is at a 12% risk of a hip fracture in the next 10 years and a 24% chance of a major osteoporotic fracture in the next 10 years.  We discussed treatments that might be eligible for her.  Given her heart disease and kidney disease she may be a good candidate for Prolia.  She would have to have her calcium monitored.  We did talk about this we spent 45 minutes talking about other things she can do from lifestyle including increasing  her BMI through exercise and adding muscle mass.  We also talked about strength training and how important this could be for balance and protection.  All of her questions were answered I did review her chart.  I gave her and her sister-in-law information about Prolia.  She is supposed to see her nephrologist in the next few weeks would like to discuss it with Dr. Zelda Hickman may call me if she wishes to go forward  Follow-Up Instructions: Return if symptoms worsen or fail to improve.   Orders:  No orders of the defined types were placed in this encounter.  No orders of the defined types were placed in this encounter.     Procedures: No procedures performed   Clinical Data: No additional findings.   Subjective: No chief complaint on file.   HPI pleasant 78 year old woman presents today referral from Dr. Sulema Endo for evaluation of osteoporosis.  She had a recent bone density scan which was -3.5 T-score.  She also has had no fractures but admits she has lost some height  Review of Systems  All other systems reviewed and are negative.    Objective: Vital Signs: LMP 11/03/1980 Comment: hysterectomy  Physical Exam Constitutional:  Appearance: Normal appearance.  Pulmonary:     Effort: Pulmonary effort is normal.  Neurological:     General: No focal deficit present.     Mental Status: She is alert and oriented to person, place, and time.  Psychiatric:        Mood and Affect: Mood normal.        Behavior: Behavior normal.       Specialty Comments:  No specialty comments available.  Imaging: No results found.   PMFS History: Patient Active Problem List   Diagnosis Date Noted   Age-related osteoporosis without current pathological fracture 09/20/2023   Chronic midline low back pain with bilateral sciatica 05/24/2023   Spondylolisthesis at L5-S1 level 05/24/2023   Right leg pain 05/24/2023   Right leg weakness 05/24/2023   Anemia of chronic renal failure, stage 4  (severe) (HCC) 11/02/2020   Hypokalemia 12/09/2019   Carotid artery stenosis 07/05/2019   Palpitations 06/24/2019   Dyspnea on exertion 06/24/2019   Hyperparathyroidism due to renal insufficiency (HCC) 03/10/2019   Renal artery stenosis (HCC) 03/10/2019   Cardiac syncope 02/16/2016   Unstable angina (HCC) 11/24/2015   CAD (coronary artery disease) 10/21/2015   S/P cardiac catheterization 10/21/2015   Iron  deficiency anemia 09/14/2015   Chest pain 08/20/2015   Hematuria 12/17/2014   Allergic condition 11/04/2014   Mitral valve prolapse 11/04/2014   Peptic ulcer 11/04/2014   Gross hematuria 11/04/2014   Atrophic vaginitis 11/04/2014   Hyperlipidemia 12/11/2013   Intolerance of drug 12/11/2013   Regurgitation 12/11/2013   Coronary atherosclerosis of autologous vein bypass graft 12/11/2013   Hypertension 12/11/2013   Presence of coronary angioplasty implant and graft 12/11/2013   Statin intolerance 12/11/2013   Other specified health status 12/11/2013   Adverse effect of unspecified drugs, medicaments and biological substances, initial encounter 12/11/2013   Anemia 09/16/2013   Chronic urinary tract infection 09/16/2013   Irritable bowel syndrome (IBS) 09/16/2013   CKD (chronic kidney disease) stage 4, GFR 15-29 ml/min (HCC) 09/16/2013   Echocardiogram abnormal 08/02/2007   Arteriosclerosis of coronary artery 09/28/2005   Atherosclerotic heart disease of native coronary artery without angina pectoris 09/28/2005   Gastro-esophageal reflux disease without esophagitis 06/30/2003   Past Medical History:  Diagnosis Date   Acid reflux    Anemia    CAD (coronary artery disease)    Chronic constipation    Chronic kidney disease    stage 3 chronic kidney disease   Eczema    H/O heart artery stent    Hyperlipidemia    Hypertension    IBS (irritable bowel syndrome) 2004   Migraine    Mitral valve prolapse    Peptic ulcer 1989   Skin cancer     Family History  Problem Relation  Age of Onset   Kidney Stones Father    Prostate cancer Father    Heart disease Father    Kidney Stones Brother    Heart disease Mother     Past Surgical History:  Procedure Laterality Date   ABDOMINAL HYSTERECTOMY     APPENDECTOMY     BREAST BIOPSY Left 07/14/2021   left stereo bx ribbon clip path stromal fibrosis   CARDIAC CATHETERIZATION N/A 10/21/2015   Procedure: Left Heart Cath and Coronary Angiography;  Surgeon: Percival Brace, MD;  Location: [facility];  Service: Cardiovascular;  Laterality: N/A;   CARDIAC CATHETERIZATION N/A 10/21/2015   Procedure: Coronary Stent Intervention;  Surgeon: Percival Brace, MD;  Location: [facility];  Service: Cardiovascular;  Laterality: N/A;   CARDIAC CATHETERIZATION Left 11/24/2015   Procedure: Coronary Stent Intervention;  Surgeon: Percival Brace, MD;  Location: [facility];  Service: Cardiovascular;  Laterality: Left;   CARDIAC SURGERY  2007   cardiac stent place   CATARACT EXTRACTION  2012   CHOLECYSTECTOMY     CORONARY ANGIOPLASTY WITH STENT PLACEMENT  2013   CORONARY ARTERY BYPASS GRAFT  2008   CORONARY STENT INTERVENTION N/A 07/04/2017   Procedure: CORONARY STENT INTERVENTION;  Surgeon: Percival Brace, MD;  Location: [facility];  Service: Cardiovascular;  Laterality: N/A;   CORONARY STENT INTERVENTION N/A 08/08/2017   Procedure: CORONARY STENT INTERVENTION;  Surgeon: Percival Brace, MD;  Location: [facility];  Service: Cardiovascular;  Laterality: N/A;   CYSTOSCOPY W/ RETROGRADES Bilateral 12/29/2014   Procedure: CYSTOSCOPY WITH RETROGRADE PYELOGRAM;  Surgeon: Michelina Aho, MD;  Location: [facility];  Service: Urology;  Laterality: Bilateral;   CYSTOSCOPY WITH BIOPSY N/A 12/29/2014   Procedure: CYSTOSCOPY WITH BIOPSY;  Surgeon: Michelina Aho, MD;  Location: [facility];  Service: Urology;  Laterality: N/A;   HAND SURGERY  1998   LEFT HEART CATH AND CORONARY  ANGIOGRAPHY Left 08/08/2017   Procedure: LEFT HEART CATH AND CORONARY ANGIOGRAPHY;  Surgeon: Percival Brace, MD;  Location: [facility];  Service: Cardiovascular;  Laterality: Left;   LEFT HEART CATH AND CORONARY ANGIOGRAPHY Left 08/01/2019   Procedure: LEFT HEART CATH AND CORONARY ANGIOGRAPHY;  Surgeon: Percival Brace, MD;  Location: [facility];  Service: Cardiovascular;  Laterality: Left;   LEFT HEART CATH AND CORS/GRAFTS ANGIOGRAPHY N/A 07/04/2017   Procedure: LEFT HEART CATH AND CORS/GRAFTS ANGIOGRAPHY;  Surgeon: Percival Brace, MD;  Location: [facility];  Service: Cardiovascular;  Laterality: N/A;   SIGMOID RESECTION / RECTOPEXY  2006   SKIN CANCER EXCISION  2013   TENDON REPAIR     right elbow   TRIGGER FINGER RELEASE     Social History   Occupational History   Not on file  Tobacco Use   Smoking status: Never    Passive exposure: Never   Smokeless tobacco: Never  Vaping Use   Vaping status: Never Used  Substance and Sexual Activity   Alcohol use: No   Drug use: No   Sexual activity: Yes    Birth control/protection: Surgical

## 2023-09-26 ENCOUNTER — Encounter (INDEPENDENT_AMBULATORY_CARE_PROVIDER_SITE_OTHER): Payer: Self-pay

## 2023-10-17 ENCOUNTER — Ambulatory Visit

## 2023-10-17 ENCOUNTER — Ambulatory Visit: Admitting: Oncology

## 2023-10-17 ENCOUNTER — Other Ambulatory Visit

## 2023-10-18 ENCOUNTER — Inpatient Hospital Stay

## 2023-10-18 ENCOUNTER — Encounter: Payer: Self-pay | Admitting: Oncology

## 2023-10-18 ENCOUNTER — Inpatient Hospital Stay: Admitting: Oncology

## 2023-10-18 ENCOUNTER — Inpatient Hospital Stay: Attending: Oncology

## 2023-10-18 VITALS — BP 120/67 | HR 89 | Temp 96.8°F | Resp 16 | Wt 101.7 lb

## 2023-10-18 DIAGNOSIS — N184 Chronic kidney disease, stage 4 (severe): Secondary | ICD-10-CM

## 2023-10-18 DIAGNOSIS — R5383 Other fatigue: Secondary | ICD-10-CM | POA: Diagnosis not present

## 2023-10-18 DIAGNOSIS — D509 Iron deficiency anemia, unspecified: Secondary | ICD-10-CM

## 2023-10-18 DIAGNOSIS — R531 Weakness: Secondary | ICD-10-CM | POA: Insufficient documentation

## 2023-10-18 DIAGNOSIS — D631 Anemia in chronic kidney disease: Secondary | ICD-10-CM | POA: Insufficient documentation

## 2023-10-18 LAB — CBC WITH DIFFERENTIAL (CANCER CENTER ONLY)
Abs Immature Granulocytes: 0.05 10*3/uL (ref 0.00–0.07)
Basophils Absolute: 0.1 10*3/uL (ref 0.0–0.1)
Basophils Relative: 1 %
Eosinophils Absolute: 0.6 10*3/uL — ABNORMAL HIGH (ref 0.0–0.5)
Eosinophils Relative: 9 %
HCT: 27.5 % — ABNORMAL LOW (ref 36.0–46.0)
Hemoglobin: 8.8 g/dL — ABNORMAL LOW (ref 12.0–15.0)
Immature Granulocytes: 1 %
Lymphocytes Relative: 19 %
Lymphs Abs: 1.4 10*3/uL (ref 0.7–4.0)
MCH: 31.3 pg (ref 26.0–34.0)
MCHC: 32 g/dL (ref 30.0–36.0)
MCV: 97.9 fL (ref 80.0–100.0)
Monocytes Absolute: 0.6 10*3/uL (ref 0.1–1.0)
Monocytes Relative: 9 %
Neutro Abs: 4.5 10*3/uL (ref 1.7–7.7)
Neutrophils Relative %: 61 %
Platelet Count: 297 10*3/uL (ref 150–400)
RBC: 2.81 MIL/uL — ABNORMAL LOW (ref 3.87–5.11)
RDW: 14.4 % (ref 11.5–15.5)
WBC Count: 7.3 10*3/uL (ref 4.0–10.5)
nRBC: 0 % (ref 0.0–0.2)

## 2023-10-18 LAB — IRON AND TIBC
Iron: 50 ug/dL (ref 28–170)
Saturation Ratios: 14 % (ref 10.4–31.8)
TIBC: 357 ug/dL (ref 250–450)
UIBC: 307 ug/dL

## 2023-10-18 LAB — FERRITIN: Ferritin: 62 ng/mL (ref 11–307)

## 2023-10-18 MED ORDER — DARBEPOETIN ALFA 200 MCG/0.4ML IJ SOSY
200.0000 ug | PREFILLED_SYRINGE | Freq: Once | INTRAMUSCULAR | Status: AC
  Administered 2023-10-18: 200 ug via SUBCUTANEOUS
  Filled 2023-10-18: qty 0.4

## 2023-10-18 NOTE — Progress Notes (Signed)
 [facility]  [phone] Fax:([phone]) [phone]  ID: Fayetta Hoose OB: 1946/02/06  MR#: [id number]  XBM#:[id number]  Patient Care Team: Antonio Baumgarten, MD as PCP - General (Internal Medicine) Shellie Dials, MD as Consulting Physician (Hematology and Oncology)  CHIEF COMPLAINT: Anemia secondary to chronic renal insufficiency.  INTERVAL HISTORY: Patient last seen in clinic in September 2024.  She is referred back by nephrology for declining hemoglobin and reinitiation of Aranesp .  She continues to have chronic weakness and fatigue, but otherwise feels well. She has no neurologic complaints. She denies any recent fevers or illnesses. She has a good appetite and denies weight loss.  She has no shortness of breath, cough, or hemoptysis.  She denies any nausea, vomiting, constipation, or diarrhea.  She has no melena or hematochezia.  She has no urinary complaints.  Patient offers no further specific complaints today.  REVIEW OF SYSTEMS:   Review of Systems  Constitutional:  Positive for malaise/fatigue. Negative for fever and weight loss.  Respiratory: Negative.  Negative for cough, hemoptysis and shortness of breath.   Cardiovascular: Negative.  Negative for chest pain and leg swelling.  Gastrointestinal: Negative.  Negative for abdominal pain, blood in stool and melena.  Genitourinary: Negative.  Negative for dysuria and hematuria.  Musculoskeletal: Negative.  Negative for back pain.  Skin: Negative.  Negative for rash.  Neurological:  Positive for weakness. Negative for dizziness, focal weakness and headaches.  Psychiatric/Behavioral: Negative.  The patient is not nervous/anxious.     As per HPI. Otherwise, a complete review of systems is negative.  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   CARDIAC CATHETERIZATION N/A 10/21/2015   Procedure: Left Heart Cath and Coronary Angiography;  Surgeon: Percival Brace, MD;  Location: [facility];  Service: Cardiovascular;  Laterality: N/A;   CARDIAC CATHETERIZATION N/A 10/21/2015   Procedure: Coronary Stent Intervention;  Surgeon: Percival Brace, MD;  Location: [facility];  Service: Cardiovascular;  Laterality: N/A;   CARDIAC CATHETERIZATION Left 11/24/2015   Procedure: Coronary Stent Intervention;  Surgeon: Percival Brace, MD;  Location: [facility];  Service: Cardiovascular;  Laterality: Left;   CARDIAC SURGERY  2007   cardiac stent place   CATARACT EXTRACTION  2012   CHOLECYSTECTOMY     CORONARY ANGIOPLASTY WITH STENT PLACEMENT  2013   CORONARY ARTERY BYPASS GRAFT  2008   CORONARY STENT INTERVENTION N/A 07/04/2017   Procedure: CORONARY STENT INTERVENTION;  Surgeon: Percival Brace, MD;  Location: [facility];  Service: Cardiovascular;  Laterality: N/A;   CORONARY STENT INTERVENTION N/A 08/08/2017   Procedure: CORONARY STENT INTERVENTION;  Surgeon: Percival Brace, MD;  Location: [facility];  Service: Cardiovascular;  Laterality: N/A;   CYSTOSCOPY W/ RETROGRADES Bilateral 12/29/2014  Procedure: CYSTOSCOPY WITH RETROGRADE PYELOGRAM;  Surgeon: Michelina Aho, MD;  Location: [facility];  Service: Urology;  Laterality: Bilateral;   CYSTOSCOPY WITH BIOPSY N/A 12/29/2014   Procedure: CYSTOSCOPY WITH BIOPSY;  Surgeon: Michelina Aho, MD;  Location: [facility];  Service:  Urology;  Laterality: N/A;   HAND SURGERY  1998   LEFT HEART CATH AND CORONARY ANGIOGRAPHY Left 08/08/2017   Procedure: LEFT HEART CATH AND CORONARY ANGIOGRAPHY;  Surgeon: Percival Brace, MD;  Location: [facility];  Service: Cardiovascular;  Laterality: Left;   LEFT HEART CATH AND CORONARY ANGIOGRAPHY Left 08/01/2019   Procedure: LEFT HEART CATH AND CORONARY ANGIOGRAPHY;  Surgeon: Percival Brace, MD;  Location: [facility];  Service: Cardiovascular;  Laterality: Left;   LEFT HEART CATH AND CORS/GRAFTS ANGIOGRAPHY N/A 07/04/2017   Procedure: LEFT HEART CATH AND CORS/GRAFTS ANGIOGRAPHY;  Surgeon: Percival Brace, MD;  Location: [facility];  Service: Cardiovascular;  Laterality: N/A;   SIGMOID RESECTION / RECTOPEXY  2006   SKIN CANCER EXCISION  2013   TENDON REPAIR     right elbow   TRIGGER FINGER RELEASE      FAMILY HISTORY: Family History  Problem Relation Age of Onset   Kidney Stones Father    Prostate cancer Father    Heart disease Father    Kidney Stones Brother    Heart disease Mother     ADVANCED DIRECTIVES (Y/N):  N  HEALTH MAINTENANCE: Social History   Tobacco Use   Smoking status: Never    Passive exposure: Never   Smokeless tobacco: Never  Vaping Use   Vaping status: Never Used  Substance Use Topics   Alcohol use: No   Drug use: No     Colonoscopy:  PAP:  Bone density:  Lipid panel:  Allergies  Allergen Reactions   Prochlorperazine Nausea And Vomiting, Other (See Comments) and Nausea Only    Severe vomiting   Ezetimibe Other (See Comments)    Muscle Pain   Statins Other (See Comments)    Muscle Pain   Tape Other (See Comments)    Pulls my skin off when the tape is removed. Use paper tape only.   Cephalexin Rash   Metoclopramide Rash and Other (See Comments)    Elevates BP, face draws to one side   Penicillins Rash and Other (See Comments)    Has patient had a PCN reaction causing immediate rash,  facial/tongue/throat swelling, SOB or lightheadedness with hypotension: Yes Has patient had a PCN reaction causing severe rash involving mucus membranes or skin necrosis: Yes Has patient had a PCN reaction that required hospitalization No Has patient had a PCN reaction occurring within the last 10 years: No If all of the above answers are NO, then may proceed with Cephalosporin use.     Current Outpatient Medications  Medication Sig Dispense Refill   acetaminophen  (TYLENOL ) 325 MG tablet Take 650 mg by mouth every 6 (six) hours as needed (for pain.).     allopurinol  (ZYLOPRIM ) 100 MG tablet Take 100 mg by mouth daily with lunch.      aspirin  325 MG tablet Take 325 mg by mouth daily with lunch.      AZO-CRANBERRY PO Take 1 capsule by mouth daily with lunch.      calcitRIOL (ROCALTROL) 0.25 MCG capsule 1 capsule once daily     CINNAMON PO Take 1,000 mg by mouth.     clopidogrel  (PLAVIX ) 75 MG tablet Take 75 mg by mouth daily  with lunch.      co-enzyme Q-10 30 MG capsule Take 30 mg by mouth daily.     ergocalciferol (VITAMIN D2) 1.25 MG (50000 UT) capsule Take by mouth.     furosemide  (LASIX ) 40 MG tablet Take 20 mg by mouth once a week. Every other day with lunch     gabapentin (NEURONTIN) 100 MG capsule Take 100 mg by mouth at bedtime.      Garlic 1000 MG CAPS Take by mouth.     hydrOXYzine (ATARAX) 25 MG tablet Take 25 mg by mouth.     hyoscyamine (LEVSIN SL) 0.125 MG SL tablet Take 0.125 mg by mouth daily.     isosorbide  mononitrate (IMDUR ) 60 MG 24 hr tablet Take 60 mg by mouth daily.      Magnesium Citrate 100 MG TABS Take 1 tablet by mouth daily.     MAGNESIUM CITRATE PO Take 100 mg by mouth daily.      metoprolol  succinate (TOPROL -XL) 25 MG 24 hr tablet Take 25 mg by mouth daily with lunch. 1/2 tab QD     nitroGLYCERIN  (NITROSTAT ) 0.4 MG SL tablet Place 0.4 mg under the tongue as needed for chest pain.     ondansetron  (ZOFRAN -ODT) 4 MG disintegrating tablet Take 4 mg by mouth 3  (three) times daily as needed.     pantoprazole  (PROTONIX ) 40 MG tablet Take 40 mg by mouth daily before breakfast.      potassium chloride  (KLOR-CON  M) 10 MEQ tablet Take by mouth.     potassium chloride  (MICRO-K ) 10 MEQ CR capsule Take 20 mEq by mouth in the morning and at bedtime.      promethazine  (PHENERGAN ) 25 MG tablet Take 25 mg by mouth every 8 (eight) hours as needed for nausea or vomiting.      ranolazine (RANEXA) 500 MG 12 hr tablet Take by mouth.     senna (SENOKOT) 8.6 MG tablet Take 4-6 tablets by mouth at bedtime.      sodium bicarbonate 650 MG tablet Take 650 mg by mouth daily with lunch.     vitamin E 400 UNIT capsule Take 400 Units by mouth daily with lunch.      riboflavin (VITAMIN B-2) 100 MG TABS tablet Take 100 mg by mouth daily with lunch.  (Patient not taking: Reported on 01/30/2023)     No current facility-administered medications for this visit.    OBJECTIVE: Vitals:   10/18/23 1323  BP: 120/67  Pulse: 89  Resp: 16  Temp: (!) 96.8 F (36 C)  SpO2: 100%     Body mass index is 20.2 kg/m.    ECOG FS:0 - Asymptomatic  General: Well-developed, well-nourished, no acute distress. Eyes: Pink conjunctiva, anicteric sclera. HEENT: Normocephalic, moist mucous membranes. Lungs: No audible wheezing or coughing. Heart: Regular rate and rhythm. Abdomen: Soft, nontender, no obvious distention. Musculoskeletal: No edema, cyanosis, or clubbing. Neuro: Alert, answering all questions appropriately. Cranial nerves grossly intact. Skin: No rashes or petechiae noted. Psych: Normal affect.  LAB RESULTS:  Lab Results  Component Value Date   NA 138 09/20/2020   K 3.6 09/20/2020   CL 112 (H) 09/20/2020   CO2 18 (L) 09/20/2020   GLUCOSE 93 09/20/2020   BUN 33 (H) 09/20/2020   CREATININE 2.93 (H) 09/20/2020   CALCIUM 8.3 (L) 09/20/2020   PROT 6.1 (L) 09/20/2020   ALBUMIN 3.6 09/20/2020   AST 16 09/20/2020   ALT 8 09/20/2020   ALKPHOS 63 09/20/2020   BILITOT 0.6  09/20/2020   GFRNONAA 16 (L) 09/20/2020   GFRAA 39 (L) 08/09/2017    Lab Results  Component Value Date   WBC 7.3 10/18/2023   NEUTROABS 4.5 10/18/2023   HGB 8.8 (L) 10/18/2023   HCT 27.5 (L) 10/18/2023   MCV 97.9 10/18/2023   PLT 297 10/18/2023   Lab Results  Component Value Date   IRON  83 05/08/2023   TIBC 270 05/08/2023   IRONPCTSAT 31 05/08/2023   Lab Results  Component Value Date   FERRITIN 91 05/08/2023     STUDIES: No results found.  ASSESSMENT: Anemia secondary to chronic renal insufficiency.  PLAN:    Anemia secondary to chronic renal insufficiency: Patient's most recent hemoglobin is 8.8.  Previously, iron  stores were within normal limits and repeat labs are pending at time of dictation.  Proceed with 200 mcg Aranesp  today.  Return to clinic in 1, 2, 3 months laboratory work and Aranesp  only if her hemoglobin remains below 10.0.  Patient will then return to clinic in 4 months with repeat laboratory work, further evaluation, and continuation of treatment if needed.  Chronic renal insufficiency: Patient's most recent GFR is decreased to 13.  Continue follow-up with nephrology as scheduled.  Cardiac disease: Previously, no further cardiac catheterizations were recommended given her worsening renal function.  Continue medical management and follow-up with cardiology as scheduled.  I spent a total of 30 minutes reviewing chart data, face-to-face evaluation with the patient, counseling and coordination of care as detailed above.  Patient expressed understanding and was in agreement with this plan. She also understands that She can call clinic at any time with any questions, concerns, or complaints.    Shellie Dials, MD   10/18/2023 1:32 PM

## 2023-10-30 ENCOUNTER — Ambulatory Visit: Admitting: Orthopedic Surgery

## 2023-10-30 DIAGNOSIS — M5416 Radiculopathy, lumbar region: Secondary | ICD-10-CM | POA: Diagnosis not present

## 2023-10-30 MED ORDER — TERIPARATIDE 560 MCG/2.24ML ~~LOC~~ SOPN: 20.0000 ug | PEN_INJECTOR | Freq: Every day | SUBCUTANEOUS | 2 refills | Status: AC

## 2023-10-30 NOTE — Progress Notes (Signed)
 Orthopedic Spine Surgery Office Note   Assessment: Patient is a 78 y.o. female with low back pain that radiates into the bilateral posterior thighs and buttocks.  Has not numbness and paresthesias along the lateral aspect of the leg into the feet.  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 Also, has osteoporosis     Plan: -Patient has chronic kidney disease with a creatinine of 3.6 and a estimated GFR of 13.  She is unable to take multiple osteoporosis medications as a result of this chronic kidney disease.  She also has significant osteoporosis with a T-score of -3.5.  Given this combination of her chronic kidney disease and very low T-score, recommended Forteo.  Prescription provided to her today.  Would need to be on this for 8 weeks prior to any surgical intervention -Referred her to PT as an additional non-operative treatment to try -Patient has tried PT, Tylenol , tramadol , gabapentin, norco, pain management -Patient should return to office in 6 weeks, x-rays at next visit: none     Patient expressed understanding of the plan and all questions were answered to the patient's satisfaction.    ___________________________________________________________________________     History:   Patient is a 79 y.o. female who presents today for follow up on her lumbar spine.  Patient continues to have low back pain, bilateral buttock, and bilateral posterior thigh pain.  She says the pain limits her ability to do much of anything during the day.  She feels the pain on a daily basis.  She has not found any treatment so far to provide her with lasting relief.  She does not have any pain radiating past the knees.  After last visit, she met with Ronal Dragon to talk about osteoporosis treatment.  Ronal Dragon looked into several treatment options but given her chronic kidney disease, did not recommend any bisphosphonate or Prolia so no treatment was initiated  at that time.  She is going to her cardiologist for a stress test.  She is not sure if she would ever even be cleared for surgical intervention, but tomorrow's test will help in deciding that.   Treatments tried: PT, Tylenol , tramadol , gabapentin, norco, pain management     Physical Exam:   General: no acute distress, appears stated age Neurologic: alert, answering questions appropriately, following commands Respiratory: unlabored breathing on room air, symmetric chest rise Psychiatric: appropriate affect, normal cadence to speech     MSK (spine):   -Strength exam                                                   Left                  Right EHL                              4/5                  4/5 TA                                 5/5                  5/5 GSC  5/5                  5/5 Knee extension            5/5                  5/5 Hip flexion                    5/5                  5/5   -Sensory exam                           Sensation intact to light touch in L3-S1 nerve distributions of bilateral lower extremities   Imaging: XRs of the lumbar spine from 08/30/2023 were previously independently reviewed and interpreted, showing grade 2 spondylolisthesis at L5/S1. Disc height loss at L5/S1. No other significant degenerative changes noted. No fracture or dislocation seen.   MRI of the lumbar spine from 02/15/2023 was previously independently reviewed and interpreted, showing spondylolisthesis at L5/S1 with bilateral foraminal stenosis and central stenosis. No other significant stenosis seen.      Patient name: [name] Patient MRN: [id number] Date of visit:10/30/23

## 2023-10-31 ENCOUNTER — Encounter: Payer: Self-pay | Admitting: Radiology

## 2023-11-03 ENCOUNTER — Telehealth: Payer: Self-pay

## 2023-11-03 NOTE — Telephone Encounter (Signed)
 [facility] called lm on  vm  [phone] and advised that the Forteo  that was sent in for the pt has an incorrect qty by one decimal point should be 6.7 ml not 67 this would be a 2 year supply. Please call to advise.

## 2023-11-03 NOTE — Telephone Encounter (Signed)
 They will send her 1 pen and she will have 3 refills

## 2023-11-08 NOTE — Progress Notes (Signed)
 Established Patient Visit   Chief Complaint: Chief Complaint  Patient presents with  . Follow-up    Follow up myoview   Date of Service: 11/08/2023 Date of Birth: Jul 12, 1945 PCP: Sadie Tamra Cal, MD  History of Present Illness: [name] is a 78 y.o.female patient who returns for   1.  BMS mid LAD 09/28/2005  2.  LIMA to LAD, SVG to ramus and OM1 12/04/2006  3.  DES mid RCA 11/23/2011  4.  Occluded SVG to ramus, 90% ostium left main 05/06/2013  5.  Xience stent ostium left main 05/23/2013  6.  Essential hypertension  7.  Mixed hyperlipidemia  8.  Statin, niacin and gemfibrozil intolerance  9.  Chronic kidney disease, stage IV  10.  Xience Alpine stent mid RCA 10/21/2015  11.  Resolute stent in-stent restenosis ostium left main 11/24/2015  12.  Xience Sierra stent in-stent restenosis left main 07/04/2017  13.  Residual 70% in-stent restenosis mid RCA 07/04/2017  13.  Resolute Onyx stents mid and proximal RCA 08/08/2017  14.  Chronic stable angina  15.  Renal artery stenosis  13.  Patent LIMA-LAD, patent LM stent by IVUS, 50% in-stent restenosis prox/mid RCA 08/01/2019  14.  Left carotid stenosis  15.  Vasovagal syncope 09/20/2020  The patient underwent cardiac catheterization 07/04/2017, which revealed severe three-vessel coronary artery disease, 90% in-stent restenosis ostium left main, 95% stenosis proximal LAD, occluded ramus intermedius branch, occluded OM1, 70% in-stent restenosis mid RCA, patent LIMA to LAD, occluded SVG to OM1, and occluded SVG to ramus.  The patient underwent successful PCI, receiving 2.75 x 12 mm Xience Sierra stent ostium left main with resolution of chest pain.  She underwent repeat cardiac catheterization on 08/08/2017 which revealed patent stent ostium left main and residual of 75% in-stent restenosis mid RCA.  The patient underwent PCI receiving a 2.5 x 26 mm resolute Onyx stent mid RCA, and 2.5 x 12 mm resolute Onyx stent proximal RCA.   Lexiscan  Myoview was performed 06/28/2019. Gated scintigraphy revealed LV ejection fraction of 57%.  SPECT analysis revealed moderate anterior, lateral and apical wall ischemia. The patient underwent repeat cardiac catheterization 08/01/2019 which revealed patent LIMA to LAD, patent left main stent confirmed by IVUS, and patent stents proximal and mid RCA with 50% in-stent restenosis.   The patient was seen at [facility] ED 09/20/2020 following syncopal episode.  Patient was going to church, got out of her car, became lightheaded, went inside the church, sat down, and had a brief syncopal episode.  The patient was brought to [facility] ED via EMS, with initial low blood pressure, treated with IV fluids.  ECG was nondiagnostic.  Troponin was negative.  The patient wished to go home, and was discharged home.  She was seen in follow-up 09/28/2020.   The patient has chronic kidney disease followed by nephrology.  She also has a history of renal artery stenosis and left carotid stenosis followed by Dr. Dreama.  Doppler ultrasound revealed 60-79% stenosis left ICA, and 1 to 59% stenosis right and left renal arteries on 07/15/2019.  72-hour Holter monitor was performed which revealed predominant sinus rhythm with mean heart rate of 77 bpm, with infrequent premature atrial contractions and premature ventricular contractions with 2 brief atrial runs.  Patient was seen 02/04/2021 at which time she complained of  near syncope, where she is sitting, goes into a trance while her husband is talking to her, eventually recovers, appears to be associated with low blood pressures.  She  has 1-2 episodes per month.  Ranolazine was discontinued and metoprolol  succinate was decreased to 12.5 mg daily with resolution of near syncopal episodes.  The patient was seen 10/24/2023 at which time she reported increased episodes of chest pain which come and go with and without exertion, occasionally requiring 2 sublingual nitroglycerin  for relief.  She reports chronic  exertional dyspnea.   The patient is active but does not do any structured exercise, limited by right-sided sciatica due to spinal stenosis.  2D echocardiogram 12/07/2021 revealed normal left ventricular function, with LVEF >55%, with moderate mitral and tricuspid regurgitation.  Carotid ultrasound 06/13/2022 revealed 40-59% RICA, 60-79% LICA.  Lexiscan Myoview was performed 10/31/2023.  Gated scintigraphy revealed LV ejection fraction of 63%.  SPECT imaging revealed anterior and lateral wall ischemia.  The patient has essential hypertension, blood pressure well controlled, currently on metoprolol  succinate 12.5 mg daily and furosemide  taken once weekly.   The patient follows a low-sodium, no added salt diet.  The patient has mixed hyperlipidemia, LDL 138, triglycerides 195 on 02/06/2023, with intolerance to statins, niacin, and gemfibrozil.  Patient follows a low-cholesterol, low-fat diet.  Past Medical and Surgical History  Past Medical History Past Medical History:  Diagnosis Date  . Allergic state   . Anemia   . Cancer (CMS/HHS-HCC)    skin  . Chronic constipation   . Chronic kidney disease 2011  . Coronary artery disease 09/28/2005   S/P stenting by Dr. Ammon  . Echocardiogram abnormal 08/02/2007   EF 55%, mild to moderate MR, mild TR, (+) diastolic dysfunction  . Eczema, unspecified   . GERD (gastroesophageal reflux disease) 06/30/2003   EGD showed gastropathy/EGD-Jan 2012- Gastric ulcer with clean base  . H/O heart artery stent 2007,2013,2015  . Heart murmur 1989  . Hemorrhoid   . History of cataract 2012   2012  . History of chicken pox   . Hyperlipidemia   . Hypertension   . Irritable bowel syndrome (IBS)    stable, constipation predominant  . Migraine   . Mitral valve prolapse   . Osteoporosis 2018  . Peptic ulcer 1989  . Reflux    stable on current therapy  . Status post de Quervain's release surgery 03/17/2021   [name]    Past Surgical History She has a past  surgical history that includes Hysterectomy (1982); Cholecystectomy (2005); Hemorrhoidectomy External (10/2003); Sphincterotomy Anal (10/2003); Sigmoidoscopy Flexible (09/2004); injection trigger point muscle (2003); Coronary artery bypass graft (12/04/2006); Endoscopic Carpal Tunnel Release (2001, 2002); Cardiac catheterization (11/23/2011); Cardiac catheterization (04/25/2012); egd (06/30/2003); egd (05/18/2010); egd (07/14/2010); Colonoscopy (06/12/2002); Colonoscopy (12/01/2008); CARDIAC CATHETERIZATION (2015); Appendectomy (1982); Cataract extraction (12/2010); Tonsillectomy (1955); Colon surgery (Sigmoid removal); Coronary angioplasty (2007,2013,2015); Right De'Quervain's release (Right, 03/17/2021); and De quervain's release (Right, 03/17/2021).   Medications and Allergies  Current Medications  Current Outpatient Medications  Medication Sig Dispense Refill  . acetaminophen  (TYLENOL ) 325 MG tablet Take 650 mg by mouth as needed for Pain.    . allopurinoL  (ZYLOPRIM ) 100 MG tablet TAKE 1 TABLET (100 MG TOTAL) BY MOUTH ONCE DAILY FOR 180 DAYS 90 tablet 1  . aspirin  325 MG tablet Take 325 mg by mouth once daily    . calcitRIOL (ROCALTROL) 0.25 MCG capsule 1 capsule once daily    . calcium acetate,phosphat bind, (PHOSLO) 667 mg tablet Take 667 mg by mouth once daily    . clopidogreL  (PLAVIX ) 75 mg tablet TAKE 1 TABLET BY MOUTH EVERY DAY 90 tablet 3  . CRANBERRY FRUIT CONCENTRATE (AZO CRANBERRY  ORAL) Take 1 capsule by mouth once daily.    . ergocalciferol, vitamin D2, 1,250 mcg (50,000 unit) capsule TAKE 1 CAPSULE (50,000 UNITS TOTAL) BY MOUTH ONE TIME PER WEEK 12 capsule 1  . FUROsemide  (LASIX ) 40 MG tablet TAKE 1 TABLET (40 MG TOTAL) BY MOUTH EVERY WEDNESDAY AND SATURDAY FOR 180 DAYS 24 tablet 1  . gabapentin (NEURONTIN) 100 MG capsule Take 1 capsule (100 mg total) by mouth 3 (three) times daily 270 capsule 1  . hydrOXYzine (ATARAX) 25 MG tablet Take 1 tablet (25 mg total) by mouth once daily as  needed for Itching for up to 90 days 30 tablet 3  . isosorbide  mononitrate (IMDUR ) 60 MG ER tablet TAKE 1 TABLET BY MOUTH TWICE A DAY 180 tablet 3  . MAGNESIUM CITRATE ORAL Take 100 mg by mouth once daily.      . metoprolol  succinate (TOPROL -XL) 25 MG XL tablet take 1 tablet by mouth every day 90 tablet 1  . nitroGLYcerin  (NITROSTAT ) 0.4 MG SL tablet PLACE 1 TABLET UNDER THE TONGUE EVERY 5 MINUTES AS NEEDED FOR CHEST PAIN MAY TAKE UP TO 3 DOSES. 75 tablet 1  . pantoprazole  (PROTONIX ) 40 MG DR tablet TAKE 1 TABLET BY MOUTH EVERY DAY 90 tablet 1  . potassium chloride  (MICRO-K ) 10 MEQ ER capsule TAKE 2 CAPSULES (20 MEQ TOTAL) BY MOUTH 2 (TWO) TIMES DAILY 360 capsule 1  . ranolazine (RANEXA) 500 MG ER tablet Take 1 tablet (500 mg total) by mouth 2 (two) times daily 180 tablet 3  . sennosides (SENOKOT) 8.6 mg tablet Take 4-6 tablets by mouth nightly.    . sodium bicarbonate 650 MG tablet Take 650 mg by mouth once daily    . traMADol  (ULTRAM ) 50 mg tablet Take 1 tablet (50 mg total) by mouth every 8 (eight) hours as needed for Pain 90 tablet 3  . vitamin E 400 UNIT capsule Take 400 Units by mouth once daily     No current facility-administered medications for this visit.    Allergies: Compazine [prochlorperazine edisylate], Keflet [cephalexin], Penicillin g, Penicillins, Prochlorperazine, Reglan [metoclopramide hcl], Statins-hmg-coa reductase inhibitors, Zetia [ezetimibe], and Metoclopramide  Social and Family History  Social History  reports that she has never smoked. She has never used smokeless tobacco. She reports that she does not drink alcohol and does not use drugs.  Family History Family History  Problem Relation Name Age of Onset  . Stroke Mother Sreenidhi Ganson   . Coronary Artery Disease (Blocked arteries around heart) Mother Bentlee Drier   . High blood pressure (Hypertension) Mother Kween Bacorn   . Osteoporosis (Thinning of bones) Mother Akiya Morr   . Hyperlipidemia (Elevated cholesterol) Mother Shevonne Wolf   . Stroke Father Archie Theadore Amber   . Coronary Artery Disease (Blocked arteries around heart) Father Archie Theadore Amber   . Myocardial Infarction (Heart attack) Father Archie Theadore Amber   . High blood pressure (Hypertension) Father Sherron Theadore Amber   . Diabetes Father Sherron Theadore Amber   . Prostate cancer Father Sherron Theadore Amber   . Asthma Father Archie Theadore Amber   . Leukemia Father Archie Theadore Amber   . Glaucoma Father Sherron Theadore Amber   . Hyperlipidemia (Elevated cholesterol) Father Sherron Theadore Amber   . Skin cancer Father Archie Theadore Amber   . High blood pressure (Hypertension) Brother    . Other Brother         AIRWAY PROBLEMS  . Breast cancer  Paternal Aunt Earnie Sever   . Breast cancer Maternal Aunt Colleen Gragg   . Diabetes type II Brother Archie [employer]   . High blood pressure (Hypertension) Brother Archie Debby Amber   . Glaucoma Brother Elsie FABIENE Lewis   . High blood pressure (Hypertension) Brother Elsie FABIENE Lewis   . Skin cancer Brother Elsie FABIENE Lewis   . Glaucoma Brother Theadore Noretta Amber   . High blood pressure (Hypertension) Brother Theadore Noretta Amber     Review of Systems   Review of Systems: The patient has exertional and nonexertional chest pain, reports mild exertional shortness of breath, without orthopnea, paroxysmal nocturnal dyspnea, with intermittent pedal edema, without palpitations, heart racing, presyncope, syncope, with positional dizziness, with anxiety, with occasional vomiting with occasional abdominal discomfort. Review of 8 Systems is negative except as described above.  Physical Examination   Vitals: BP 122/68   Pulse 85   Ht 152.4 cm (5')   Wt 45.8 kg (101 lb)   SpO2 98%   BMI 19.73 kg/m  Ht:152.4 cm (5') Wt:45.8 kg (101 lb) ADJ:Anib surface area is 1.39 meters squared. Body mass index is 19.73  kg/m.  General: Alert and oriented. Well-appearing. No acute distress. HEENT: Pupils equally reactive to light and accomodation    Neck: Supple, no JVD.  Bruits appreciated on bilateral carotids Lungs: Normal effort of breathing; clear to auscultation bilaterally; no wheezes, rales, rhonchi Heart: Regular rate and rhythm. No murmur, rub, or gallop. 2/6 systolic murmur.  Abdomen: nondistended Extremities: no cyanosis, clubbing, or edema Peripheral Pulses: 2+ radial  Skin: Warm, dry, no diaphoresis  Assessment   78 y.o. female with  1. Atherosclerosis of native coronary artery of native heart with angina pectoris ()   2. Coronary artery disease involving autologous vein bypass graft, unspecified whether angina present   3. Coronary artery disease involving native coronary artery of native heart without angina pectoris   4. Chest pain with high risk for cardiac etiology   5. Coronary artery disease of autologous vein bypass graft with stable angina pectoris ()   6. Heart palpitations   7. Primary hypertension   8. Mitral valve prolapse   9. S/p bare metal coronary artery stent   10. S/P cardiac catheterization   11. S/P drug eluting coronary stent placement   12. SOB (shortness of breath) on exertion   13. CKD (chronic kidney disease) stage 4, GFR 15-29 ml/min (CMS/HHS-HCC)     78 year old female with known coronary disease, status post CABG, status post multiple stents, with worsening exertional chest pain in a crescendo pattern, which improved after DES of mid RCA, and resolved after redo DES of in-stent restenosis of the ostium of left main on 11/24/2015. ETT Myoview study revealed moderate to severe lateral wall ischemia.  Repeat cardiac catheterization revealed in-stent restenosis of left main, and the patient underwent successful PCI receiving 2.75 x 12 mm Xience Sierra stent, with temporary resolution of chest pain and shortness of breath.  However, the patient developed recurrent  episodes of chest pain with exertional dyspnea . The patient received Resolute Onyx drug-eluting stents for in-stent restenosis of mid RCA, and proximal RCA, with overall clinical improvement.  Repeat cardiac catheterization revealed patent LIMA to LAD, patent left main stent confirmed by IVUS, and patent multiple stents proximal mid RCA with 50% in-stent restenosis.  The patient has essential hypertension, blood pressure normal on current BP medications today.  The patient has stage IV CKD, followed by Dr. Dennise. The patient also  saw Dr. Dreama, at which time doppler ultrasound revealed 60 to 79% stenosis left ICA, and 1 to 59% stenosis right and left renal arteries.  Patient had  syncopal episode, likely due to low blood pressure, resolved after intravenous fluids.  72-hour Holter monitor was unremarkable.  Patient returns with increased episodes of chest pain which occur with and without exertion.  Patient currently takes Ranexa 250 mg twice daily.  Lexiscan Myoview 10/27/2023 revealed anterior and lateral wall ischemia.  Had lengthy discussion regarding possible cardiac catheterization and percutaneous revascularization, with high risk for renal failure and need for dialysis.  Plan   1.  Continue current medications 2.  Counseled patient about low-sodium diet 3.  DASH diet printed instructions given to the patient 4.  Counseled patient about low-cholesterol diet 5.  Low-fat and cholesterol diet printed instructions given to the patient 6.  Continue Ranexa  250 mg every morning, 500 mg every afternoon 7.  Discussed the risk, benefits and alternatives of cardiac catheterization and possible PCI.  Patient hesitant at this time to proceed, concerned about potential need for dialysis. 8.  Return to clinic for follow-up in 3 weeks to readdress possible cardiac catheterization   No orders of the defined types were placed in this encounter.   Return in about 3 weeks (around 11/29/2023).    MARSA DOOMS, MD PhD [facility]

## 2023-11-09 ENCOUNTER — Telehealth: Payer: Self-pay | Admitting: Orthopedic Surgery

## 2023-11-09 NOTE — Telephone Encounter (Signed)
 Patient called. Says she will not be able to have surgery. Has 2 heart blockages.

## 2023-11-17 ENCOUNTER — Ambulatory Visit

## 2023-11-17 ENCOUNTER — Other Ambulatory Visit

## 2023-11-17 ENCOUNTER — Inpatient Hospital Stay

## 2023-11-17 ENCOUNTER — Inpatient Hospital Stay: Attending: Oncology

## 2023-11-17 VITALS — BP 108/62

## 2023-11-17 DIAGNOSIS — D631 Anemia in chronic kidney disease: Secondary | ICD-10-CM

## 2023-11-17 DIAGNOSIS — D509 Iron deficiency anemia, unspecified: Secondary | ICD-10-CM

## 2023-11-17 DIAGNOSIS — N184 Chronic kidney disease, stage 4 (severe): Secondary | ICD-10-CM | POA: Insufficient documentation

## 2023-11-17 LAB — CBC WITH DIFFERENTIAL (CANCER CENTER ONLY)
Abs Immature Granulocytes: 0.05 K/uL (ref 0.00–0.07)
Basophils Absolute: 0.1 K/uL (ref 0.0–0.1)
Basophils Relative: 1 %
Eosinophils Absolute: 0.5 K/uL (ref 0.0–0.5)
Eosinophils Relative: 8 %
HCT: 31.4 % — ABNORMAL LOW (ref 36.0–46.0)
Hemoglobin: 9.7 g/dL — ABNORMAL LOW (ref 12.0–15.0)
Immature Granulocytes: 1 %
Lymphocytes Relative: 19 %
Lymphs Abs: 1.2 K/uL (ref 0.7–4.0)
MCH: 29.9 pg (ref 26.0–34.0)
MCHC: 30.9 g/dL (ref 30.0–36.0)
MCV: 96.9 fL (ref 80.0–100.0)
Monocytes Absolute: 0.7 K/uL (ref 0.1–1.0)
Monocytes Relative: 11 %
Neutro Abs: 3.8 K/uL (ref 1.7–7.7)
Neutrophils Relative %: 60 %
Platelet Count: 289 K/uL (ref 150–400)
RBC: 3.24 MIL/uL — ABNORMAL LOW (ref 3.87–5.11)
RDW: 14.6 % (ref 11.5–15.5)
WBC Count: 6.4 K/uL (ref 4.0–10.5)
nRBC: 0 % (ref 0.0–0.2)

## 2023-11-17 LAB — IRON AND TIBC
Iron: 74 ug/dL (ref 28–170)
Saturation Ratios: 19 % (ref 10.4–31.8)
TIBC: 381 ug/dL (ref 250–450)
UIBC: 307 ug/dL

## 2023-11-17 LAB — FERRITIN: Ferritin: 32 ng/mL (ref 11–307)

## 2023-11-17 MED ORDER — DARBEPOETIN ALFA 200 MCG/0.4ML IJ SOSY
200.0000 ug | PREFILLED_SYRINGE | Freq: Once | INTRAMUSCULAR | Status: AC
  Administered 2023-11-17: 200 ug via SUBCUTANEOUS
  Filled 2023-11-17: qty 0.4

## 2023-11-22 ENCOUNTER — Other Ambulatory Visit: Payer: Self-pay | Admitting: Internal Medicine

## 2023-11-22 ENCOUNTER — Ambulatory Visit
Admission: RE | Admit: 2023-11-22 | Discharge: 2023-11-22 | Disposition: A | Discharge status: Home / Self Care | Source: Ambulatory Visit | Attending: Internal Medicine | Admitting: Internal Medicine

## 2023-11-22 DIAGNOSIS — R4702 Dysphasia: Secondary | ICD-10-CM | POA: Insufficient documentation

## 2023-12-11 ENCOUNTER — Ambulatory Visit: Admitting: Orthopedic Surgery

## 2023-12-15 ENCOUNTER — Other Ambulatory Visit: Payer: Self-pay | Admitting: *Deleted

## 2023-12-15 DIAGNOSIS — D631 Anemia in chronic kidney disease: Secondary | ICD-10-CM

## 2023-12-18 ENCOUNTER — Inpatient Hospital Stay

## 2023-12-18 ENCOUNTER — Inpatient Hospital Stay: Attending: Oncology

## 2023-12-18 DIAGNOSIS — D631 Anemia in chronic kidney disease: Secondary | ICD-10-CM | POA: Insufficient documentation

## 2023-12-18 DIAGNOSIS — N184 Chronic kidney disease, stage 4 (severe): Secondary | ICD-10-CM | POA: Insufficient documentation

## 2023-12-18 LAB — CBC WITH DIFFERENTIAL/PLATELET
Abs Immature Granulocytes: 0.05 K/uL (ref 0.00–0.07)
Basophils Absolute: 0.1 K/uL (ref 0.0–0.1)
Basophils Relative: 1 %
Eosinophils Absolute: 0.3 K/uL (ref 0.0–0.5)
Eosinophils Relative: 5 %
HCT: 32.6 % — ABNORMAL LOW (ref 36.0–46.0)
Hemoglobin: 10.4 g/dL — ABNORMAL LOW (ref 12.0–15.0)
Immature Granulocytes: 1 %
Lymphocytes Relative: 21 %
Lymphs Abs: 1.5 K/uL (ref 0.7–4.0)
MCH: 29.5 pg (ref 26.0–34.0)
MCHC: 31.9 g/dL (ref 30.0–36.0)
MCV: 92.4 fL (ref 80.0–100.0)
Monocytes Absolute: 0.7 K/uL (ref 0.1–1.0)
Monocytes Relative: 10 %
Neutro Abs: 4.5 K/uL (ref 1.7–7.7)
Neutrophils Relative %: 62 %
Platelets: 329 K/uL (ref 150–400)
RBC: 3.53 MIL/uL — ABNORMAL LOW (ref 3.87–5.11)
RDW: 15.2 % (ref 11.5–15.5)
WBC: 7.1 K/uL (ref 4.0–10.5)
nRBC: 0 % (ref 0.0–0.2)

## 2023-12-18 NOTE — Progress Notes (Signed)
 Hgb 10.4 today, no injection given

## 2024-01-03 ENCOUNTER — Other Ambulatory Visit: Payer: Self-pay

## 2024-01-03 ENCOUNTER — Emergency Department

## 2024-01-03 ENCOUNTER — Emergency Department
Admission: EM | Admit: 2024-01-03 | Discharge: 2024-01-03 | Disposition: A | Discharge status: Home / Self Care | Attending: Emergency Medicine | Admitting: Emergency Medicine

## 2024-01-03 DIAGNOSIS — N189 Chronic kidney disease, unspecified: Secondary | ICD-10-CM | POA: Insufficient documentation

## 2024-01-03 DIAGNOSIS — K529 Noninfective gastroenteritis and colitis, unspecified: Secondary | ICD-10-CM | POA: Diagnosis not present

## 2024-01-03 DIAGNOSIS — I2510 Atherosclerotic heart disease of native coronary artery without angina pectoris: Secondary | ICD-10-CM | POA: Diagnosis not present

## 2024-01-03 DIAGNOSIS — N179 Acute kidney failure, unspecified: Secondary | ICD-10-CM | POA: Diagnosis not present

## 2024-01-03 DIAGNOSIS — R1084 Generalized abdominal pain: Secondary | ICD-10-CM | POA: Diagnosis present

## 2024-01-03 LAB — GASTROINTESTINAL PANEL BY PCR, STOOL (REPLACES STOOL CULTURE)

## 2024-01-03 LAB — RESP PANEL BY RT-PCR (RSV, FLU A&B, COVID)  RVPGX2
Influenza A by PCR: NEGATIVE
Influenza B by PCR: NEGATIVE
Resp Syncytial Virus by PCR: NEGATIVE
SARS Coronavirus 2 by RT PCR: NEGATIVE

## 2024-01-03 LAB — CBC
HCT: 43 % (ref 36.0–46.0)
Hemoglobin: 13.9 g/dL (ref 12.0–15.0)
MCH: 29.1 pg (ref 26.0–34.0)
MCHC: 32.3 g/dL (ref 30.0–36.0)
MCV: 90.1 fL (ref 80.0–100.0)
Platelets: 353 K/uL (ref 150–400)
RBC: 4.77 MIL/uL (ref 3.87–5.11)
RDW: 14.7 % (ref 11.5–15.5)
WBC: 9.4 K/uL (ref 4.0–10.5)
nRBC: 0 % (ref 0.0–0.2)

## 2024-01-03 LAB — C DIFFICILE QUICK SCREEN W PCR REFLEX
C Diff antigen: NEGATIVE
C Diff interpretation: NOT DETECTED
C Diff toxin: NEGATIVE

## 2024-01-03 LAB — COMPREHENSIVE METABOLIC PANEL WITH GFR
ALT: 25 U/L (ref 0–44)
AST: 31 U/L (ref 15–41)
Albumin: 4.4 g/dL (ref 3.5–5.0)
Alkaline Phosphatase: 37 U/L — ABNORMAL LOW (ref 38–126)
Anion gap: 18 — ABNORMAL HIGH (ref 5–15)
BUN: 62 mg/dL — ABNORMAL HIGH (ref 8–23)
CO2: 22 mmol/L (ref 22–32)
Calcium: 10.5 mg/dL — ABNORMAL HIGH (ref 8.9–10.3)
Chloride: 99 mmol/L (ref 98–111)
Creatinine, Ser: 4.62 mg/dL — ABNORMAL HIGH (ref 0.44–1.00)
GFR, Estimated: 9 mL/min — ABNORMAL LOW (ref >60)
Glucose, Bld: 131 mg/dL — ABNORMAL HIGH (ref 70–99)
Potassium: 3.3 mmol/L — ABNORMAL LOW (ref 3.5–5.1)
Sodium: 139 mmol/L (ref 135–145)
Total Bilirubin: 0.7 mg/dL (ref 0.0–1.2)
Total Protein: 7.4 g/dL (ref 6.5–8.1)

## 2024-01-03 LAB — LIPASE, BLOOD: Lipase: 48 U/L (ref 11–51)

## 2024-01-03 LAB — TROPONIN I (HIGH SENSITIVITY)
Troponin I (High Sensitivity): 18 ng/L — ABNORMAL HIGH (ref <18)
Troponin I (High Sensitivity): 17 ng/L (ref <18)

## 2024-01-03 MED ORDER — SODIUM CHLORIDE 0.9 % IV BOLUS
1000.0000 mL | Freq: Once | INTRAVENOUS | Status: AC
  Administered 2024-01-03: 1000 mL via INTRAVENOUS

## 2024-01-03 MED ORDER — PANTOPRAZOLE SODIUM 40 MG IV SOLR
40.0000 mg | Freq: Once | INTRAVENOUS | Status: AC
  Administered 2024-01-03: 40 mg via INTRAVENOUS
  Filled 2024-01-03: qty 10

## 2024-01-03 MED ORDER — ONDANSETRON HCL 4 MG/2ML IJ SOLN
4.0000 mg | Freq: Once | INTRAMUSCULAR | Status: AC
  Administered 2024-01-03: 4 mg via INTRAVENOUS
  Filled 2024-01-03: qty 2

## 2024-01-03 NOTE — ED Triage Notes (Signed)
 Pt reports n/v/d and abd pain that began 4 days ago, pt reports her BP prior to arrival systolic was in the 60s. Pt reports generalized weakness.

## 2024-01-03 NOTE — ED Provider Notes (Addendum)
 [facility] Provider Note    Event Date/Time   First MD Initiated Contact with Patient 01/03/24 0120     (approximate)   History   Abdominal Pain   HPI  [name] is a 78 y.o. female with history of CKD, coronary artery disease who comes in with concerns for diarrhea.  Patient has been on not feeling well for the past 4 days.  She reports having nausea vomiting diarrhea.  She does report some generalized abdominal cramping with the diarrhea but she denies any current pain.  Her daughter is at bedside who reports that keeping track of her blood pressures and noticed that her blood pressures have been really low and patient has been having some generalized weakness and therefore they presented to the ER.  The diarrhea has actually improved slightly today and she was able to eat a little something but because of the weakness they presented today for evaluation.  She did have a little bit of chest pain today as well.  They deny any falls or hit her head. Denies any blood in stool.    Physical Exam   Triage Vital Signs: ED Triage Vitals [01/03/24 0115]  Encounter Vitals Group     BP 90/65     Girls Systolic BP Percentile      Girls Diastolic BP Percentile      Boys Systolic BP Percentile      Boys Diastolic BP Percentile      Pulse Rate (!) 124     Resp 17     Temp 97.9 F (36.6 C)     Temp src      SpO2 99 %     Weight      Height      Head Circumference      Peak Flow      Pain Score      Pain Loc      Pain Education      Exclude from Growth Chart     Most recent vital signs: Vitals:   01/03/24 0115  BP: 90/65  Pulse: (!) 124  Resp: 17  Temp: 97.9 F (36.6 C)  SpO2: 99%     General: Awake, no distress.  CV:  Good peripheral perfusion.  Resp:  Normal effort.  Abd:  No distention.  Soft and nontender Other:  Moving all extremities well no cranial nerve deficits   ED Results / Procedures / Treatments   Labs (all labs  ordered are listed, but only abnormal results are displayed) Labs Reviewed  RESP PANEL BY RT-PCR (RSV, FLU A&B, COVID)  [id number]  GASTROINTESTINAL PANEL BY PCR, STOOL (REPLACES STOOL CULTURE)  C DIFFICILE QUICK SCREEN W PCR REFLEX    LIPASE, BLOOD  COMPREHENSIVE METABOLIC PANEL WITH GFR  CBC  URINALYSIS, ROUTINE W REFLEX MICROSCOPIC  TROPONIN I (HIGH SENSITIVITY)     EKG  My interpretation of EKG:  Sinus tachycardia rate of 109 without any ST elevation or T wave versions, normal intervals  RADIOLOGY I have reviewed the xray personally and interpreted no evidence of any pneumonia   PROCEDURES:  Critical Care performed: No  .1-3 Lead EKG Interpretation  Performed by: Ernest Ronal BRAVO, MD Authorized by: Ernest Ronal BRAVO, MD     Interpretation: abnormal     ECG rate:  110   ECG rate assessment: tachycardic     Rhythm: sinus tachycardia     Ectopy: none     Conduction: normal  MEDICATIONS ORDERED IN ED: Medications  sodium chloride  0.9 % bolus 1,000 mL (has no administration in time range)  ondansetron  (ZOFRAN ) injection 4 mg (has no administration in time range)  pantoprazole  (PROTONIX ) injection 40 mg (has no administration in time range)     IMPRESSION / MDM / ASSESSMENT AND PLAN / ED COURSE  I reviewed the triage vital signs and the nursing notes.   Patient's presentation is most consistent with acute presentation with potential threat to life or bodily function.   Patient comes in tachycardic with low blood pressures I suspect related to dehydration given multiple episodes of nausea vomiting diarrhea.  This sounds most likely like gastroenteritis.  Will give patient fluids, Zofran .  Considered CT imaging but her abdomen is soft and nontender so we will hold off at this time.  Will check patient's electrolytes to evaluate for dehydration.  Given multiple episodes and patient's age we will try to get stool studies to evaluate for C. difficile or other causes of  patient's presentation  Patient's white count is normal.  Lipase normal CMP does show elevation of her creatinine up to 4.6 from a baseline of 4.23.  This was on 12/04/2023.  And on 6/2 she was 3.6.  Initial troponin was slightly elevated 18 but downtrending to 17 so unlikely ACS more likely demand from dehydration.  COVID and flu are negative.  2:26 AM repeat abdominal exam remains soft and nontender.  She reports feeling much improved.  We discussed doing orthostatics to see if she needs additional fluid.  Discussed admission to the hospital versus going home but given her creatinine is not too off from her baseline if she is tolerating p.o. and seen feeling improved they felt comfortable with potential discharge home.  4:25 AM patient did not do well with orthostatics and is getting additional liter of fluid.  Her stool sample was mixed with the urine so unable to send it.  Reevaluated patient abdomen soft and nontender she reports being ready for discharge home.  We discussed finishing the second liter of fluid and rechecking some orthostatics and trying to get a stool sample.  We discussed admission to the hospital but patient reports feeling much improved and requesting discharge.  She is willing to try to give another stool sample before discharge.  5:29 AM patient now standing up without any symptoms she is feeling much improved after fluids.  She again is requesting discharge home.  Stool studies are pending.  We discussed waiting for these results but they prefer to go home and we will call them with results later today.  I did send this to our resource nurse and I have flagged for myself to review when I get back tomorrow.  Expressed understanding and felt comfortable with discharge home.  She continues to deny any pain in her abdomen is soft and nontender  No urinary symptoms.   The patient is on the cardiac monitor to evaluate for evidence of arrhythmia and/or significant heart rate  changes.      FINAL CLINICAL IMPRESSION(S) / ED DIAGNOSES   Final diagnoses:  Gastroenteritis  Acute renal failure superimposed on chronic kidney disease, unspecified acute renal failure type, unspecified CKD stage (HCC)     Rx / DC Orders   ED Discharge Orders     None        Note:  This document was prepared using Dragon voice recognition software and may include unintentional dictation errors.   Ernest Ronal BRAVO, MD 01/03/24 [phone]  Ernest Ronal BRAVO, MD 01/03/24 [id number]    Ernest Ronal BRAVO, MD 01/03/24 [phone]

## 2024-01-03 NOTE — ED Notes (Signed)
 Orthostatic VS obtained and positive. Notified Funke, MD. Patient also attempted to give UA but contaminated with stool.

## 2024-01-03 NOTE — ED Notes (Signed)
 Patient given discharge instructions including importance of fluid intake with stated understanding. INT removed, cannula intact, pressure dressing applied. Patient stable and wheeled out to lobby on dispo.

## 2024-01-03 NOTE — Discharge Instructions (Addendum)
 You did show some signs of dehydration compared to your baseline but we have given you 2 L of fluids. CR was 4.6 but 1 month ago it was 4.2.[id number] I suspect this is most likely a viral bacterial gastroenteritis.  Your stool studies are pending.  Please leave your phone on you as we will call you with results and return to the ER if you develop worsening symptoms or any other concerns

## 2024-01-18 ENCOUNTER — Inpatient Hospital Stay

## 2024-01-18 ENCOUNTER — Inpatient Hospital Stay: Attending: Oncology

## 2024-01-18 ENCOUNTER — Other Ambulatory Visit: Payer: Self-pay | Admitting: *Deleted

## 2024-01-18 VITALS — BP 75/45

## 2024-01-18 DIAGNOSIS — D631 Anemia in chronic kidney disease: Secondary | ICD-10-CM | POA: Diagnosis present

## 2024-01-18 DIAGNOSIS — N184 Chronic kidney disease, stage 4 (severe): Secondary | ICD-10-CM | POA: Insufficient documentation

## 2024-01-18 LAB — CBC WITH DIFFERENTIAL/PLATELET
Abs Immature Granulocytes: 0.07 K/uL (ref 0.00–0.07)
Basophils Absolute: 0.1 K/uL (ref 0.0–0.1)
Basophils Relative: 1 %
Eosinophils Absolute: 0.4 K/uL (ref 0.0–0.5)
Eosinophils Relative: 6 %
HCT: 28.1 % — ABNORMAL LOW (ref 36.0–46.0)
Hemoglobin: 9 g/dL — ABNORMAL LOW (ref 12.0–15.0)
Immature Granulocytes: 1 %
Lymphocytes Relative: 22 %
Lymphs Abs: 1.6 K/uL (ref 0.7–4.0)
MCH: 29.1 pg (ref 26.0–34.0)
MCHC: 32 g/dL (ref 30.0–36.0)
MCV: 90.9 fL (ref 80.0–100.0)
Monocytes Absolute: 0.7 K/uL (ref 0.1–1.0)
Monocytes Relative: 10 %
Neutro Abs: 4.4 K/uL (ref 1.7–7.7)
Neutrophils Relative %: 60 %
Platelets: 250 K/uL (ref 150–400)
RBC: 3.09 MIL/uL — ABNORMAL LOW (ref 3.87–5.11)
RDW: 15.6 % — ABNORMAL HIGH (ref 11.5–15.5)
WBC: 7.3 K/uL (ref 4.0–10.5)
nRBC: 0 % (ref 0.0–0.2)

## 2024-01-18 MED ORDER — DARBEPOETIN ALFA 200 MCG/0.4ML IJ SOSY
200.0000 ug | PREFILLED_SYRINGE | Freq: Once | INTRAMUSCULAR | Status: AC
  Administered 2024-01-18: 200 ug via SUBCUTANEOUS
  Filled 2024-01-18: qty 0.4

## 2024-01-18 NOTE — Progress Notes (Signed)
 Patient here for Aranesp  today.  BP is low on check at 75/45.  Discussed with patient who states that she had a recent GI virus with vomiting and diarrhea that led her to ED for IVF.    Patient is aware of low bp and monitors at home.  Was evaluated by nephrologist, Dr. Dennise, earlier in the week who advised she drink the OTC IV hydration packs.  Patient has not stated yet but does have some at home that she will start drinking.    Will continue home bp monitoring and will contact PCP or cardiologist of BP does not improve

## 2024-01-21 NOTE — Progress Notes (Signed)
 Giving low bp and vomiting covid test order-- gi panel due to pt age and multiple episodes of diarrhea

## 2024-02-14 ENCOUNTER — Other Ambulatory Visit: Payer: Self-pay | Admitting: *Deleted

## 2024-02-14 DIAGNOSIS — D631 Anemia in chronic kidney disease: Secondary | ICD-10-CM

## 2024-02-15 ENCOUNTER — Encounter: Payer: Self-pay | Admitting: Oncology

## 2024-02-15 ENCOUNTER — Inpatient Hospital Stay: Admitting: Oncology

## 2024-02-15 ENCOUNTER — Inpatient Hospital Stay: Attending: Oncology

## 2024-02-15 ENCOUNTER — Inpatient Hospital Stay

## 2024-02-15 VITALS — BP 100/57 | HR 81 | Temp 98.6°F | Resp 18 | Ht 59.5 in | Wt 103.9 lb

## 2024-02-15 DIAGNOSIS — N184 Chronic kidney disease, stage 4 (severe): Secondary | ICD-10-CM

## 2024-02-15 DIAGNOSIS — D631 Anemia in chronic kidney disease: Secondary | ICD-10-CM | POA: Diagnosis not present

## 2024-02-15 LAB — CBC WITH DIFFERENTIAL/PLATELET
Abs Immature Granulocytes: 0.07 K/uL (ref 0.00–0.07)
Basophils Absolute: 0.1 K/uL (ref 0.0–0.1)
Basophils Relative: 1 %
Eosinophils Absolute: 0.2 K/uL (ref 0.0–0.5)
Eosinophils Relative: 1 %
HCT: 33 % — ABNORMAL LOW (ref 36.0–46.0)
Hemoglobin: 10.5 g/dL — ABNORMAL LOW (ref 12.0–15.0)
Immature Granulocytes: 1 %
Lymphocytes Relative: 13 %
Lymphs Abs: 1.6 K/uL (ref 0.7–4.0)
MCH: 29.5 pg (ref 26.0–34.0)
MCHC: 31.8 g/dL (ref 30.0–36.0)
MCV: 92.7 fL (ref 80.0–100.0)
Monocytes Absolute: 0.9 K/uL (ref 0.1–1.0)
Monocytes Relative: 8 %
Neutro Abs: 9.1 K/uL — ABNORMAL HIGH (ref 1.7–7.7)
Neutrophils Relative %: 76 %
Platelets: 275 K/uL (ref 150–400)
RBC: 3.56 MIL/uL — ABNORMAL LOW (ref 3.87–5.11)
RDW: 17.5 % — ABNORMAL HIGH (ref 11.5–15.5)
WBC: 11.9 K/uL — ABNORMAL HIGH (ref 4.0–10.5)
nRBC: 0 % (ref 0.0–0.2)

## 2024-02-15 NOTE — Progress Notes (Unsigned)
 [facility]  Telephone:([phone]) [phone] Fax:(336) [phone]  ID: [name] OB: December 28, 1945  MR#: [id number]  RDW#:[id number]  Patient Care Team: Sadie Manna, MD as PCP - General (Internal Medicine) Jacobo Evalene PARAS, MD as Consulting Physician (Hematology and Oncology)  CHIEF COMPLAINT: Anemia secondary to chronic renal insufficiency.  INTERVAL HISTORY: Patient returns to clinic today for repeat laboratory work, further evaluation, and continuation of Aranesp .  She currently feels well and is asymptomatic.  She does not complain of any weakness or fatigue today. She has no neurologic complaints. She denies any recent fevers or illnesses. She has a good appetite and denies weight loss.  She has no shortness of breath, cough, or hemoptysis.  She denies any nausea, vomiting, constipation, or diarrhea.  She has no melena or hematochezia.  She has no urinary complaints.  Patient offers no specific complaints today.  REVIEW OF SYSTEMS:   Review of Systems  Constitutional: Negative.  Negative for fever, malaise/fatigue and weight loss.  Respiratory: Negative.  Negative for cough, hemoptysis and shortness of breath.   Cardiovascular: Negative.  Negative for chest pain and leg swelling.  Gastrointestinal: Negative.  Negative for abdominal pain, blood in stool and melena.  Genitourinary: Negative.  Negative for dysuria and hematuria.  Musculoskeletal: Negative.  Negative for back pain.  Skin: Negative.  Negative for rash.  Neurological: Negative.  Negative for dizziness, focal weakness, weakness and headaches.  Psychiatric/Behavioral: Negative.  The patient is not nervous/anxious.     As per HPI. Otherwise, a complete review of systems is negative.  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   CARDIAC CATHETERIZATION N/A 10/21/2015   Procedure: Left Heart Cath and Coronary Angiography;  Surgeon: Marsa Dooms, MD;  Location: [facility];  Service: Cardiovascular;  Laterality: N/A;   CARDIAC CATHETERIZATION N/A 10/21/2015   Procedure: Coronary Stent Intervention;  Surgeon: Marsa Dooms, MD;  Location: [facility];  Service: Cardiovascular;  Laterality: N/A;   CARDIAC CATHETERIZATION Left 11/24/2015   Procedure: Coronary Stent Intervention;  Surgeon: Marsa Dooms, MD;  Location: [facility];  Service: Cardiovascular;  Laterality: Left;   CARDIAC SURGERY  2007   cardiac stent place   CATARACT EXTRACTION  2012   CHOLECYSTECTOMY     CORONARY ANGIOPLASTY WITH STENT PLACEMENT  2013   CORONARY ARTERY BYPASS GRAFT  2008   CORONARY STENT INTERVENTION N/A 07/04/2017   Procedure: CORONARY STENT INTERVENTION;  Surgeon: Dooms Marsa, MD;  Location: [facility];  Service: Cardiovascular;  Laterality: N/A;   CORONARY STENT INTERVENTION N/A 08/08/2017   Procedure: CORONARY STENT INTERVENTION;  Surgeon: Dooms Marsa, MD;  Location: [facility];  Service: Cardiovascular;  Laterality: N/A;   CYSTOSCOPY W/ RETROGRADES Bilateral 12/29/2014   Procedure: CYSTOSCOPY WITH RETROGRADE  PYELOGRAM;  Surgeon: Charlie JONETTA Pack, MD;  Location: [facility];  Service: Urology;  Laterality: Bilateral;   CYSTOSCOPY WITH BIOPSY N/A 12/29/2014   Procedure: CYSTOSCOPY WITH BIOPSY;  Surgeon: Charlie JONETTA Pack, MD;  Location: [facility];  Service: Urology;  Laterality: N/A;    HAND SURGERY  1998   LEFT HEART CATH AND CORONARY ANGIOGRAPHY Left 08/08/2017   Procedure: LEFT HEART CATH AND CORONARY ANGIOGRAPHY;  Surgeon: Ammon Blunt, MD;  Location: [facility];  Service: Cardiovascular;  Laterality: Left;   LEFT HEART CATH AND CORONARY ANGIOGRAPHY Left 08/01/2019   Procedure: LEFT HEART CATH AND CORONARY ANGIOGRAPHY;  Surgeon: Ammon Blunt, MD;  Location: [facility];  Service: Cardiovascular;  Laterality: Left;   LEFT HEART CATH AND CORS/GRAFTS ANGIOGRAPHY N/A 07/04/2017   Procedure: LEFT HEART CATH AND CORS/GRAFTS ANGIOGRAPHY;  Surgeon: Ammon Blunt, MD;  Location: [facility];  Service: Cardiovascular;  Laterality: N/A;   SIGMOID RESECTION / RECTOPEXY  2006   SKIN CANCER EXCISION  2013   TENDON REPAIR     right elbow   TRIGGER FINGER RELEASE      FAMILY HISTORY: Family History  Problem Relation Age of Onset   Kidney Stones Father    Prostate cancer Father    Heart disease Father    Kidney Stones Brother    Heart disease Mother     ADVANCED DIRECTIVES (Y/N):  N  HEALTH MAINTENANCE: Social History   Tobacco Use   Smoking status: Never    Passive exposure: Never   Smokeless tobacco: Never  Vaping Use   Vaping status: Never Used  Substance Use Topics   Alcohol use: No   Drug use: No     Colonoscopy:  PAP:  Bone density:  Lipid panel:  Allergies  Allergen Reactions   Prochlorperazine Nausea And Vomiting, Other (See Comments) and Nausea Only    Severe vomiting   Ezetimibe Other (See Comments)    Muscle Pain   Statins Other (See Comments)    Muscle Pain   Tape Other (See Comments)    Pulls my skin off when the tape is removed. Use paper tape only.   Cephalexin Rash   Metoclopramide Rash and Other (See Comments)    Elevates BP, face draws to one side   Penicillins Rash and Other (See Comments)    Has patient had a PCN reaction causing immediate rash, facial/tongue/throat swelling, SOB  or lightheadedness with hypotension: Yes Has patient had a PCN reaction causing severe rash involving mucus membranes or skin necrosis: Yes Has patient had a PCN reaction that required hospitalization No Has patient had a PCN reaction occurring within the last 10 years: No If all of the above answers are NO, then may proceed with Cephalosporin use.     Current Outpatient Medications  Medication Sig Dispense Refill   acetaminophen  (TYLENOL ) 325 MG tablet Take 650 mg by mouth every 6 (six) hours as needed (for pain.).     allopurinol  (ZYLOPRIM ) 100 MG tablet Take 100 mg by mouth daily with lunch.      aspirin  325 MG tablet Take 325 mg by mouth daily with lunch.      AZO-CRANBERRY PO Take 1 capsule by mouth daily with lunch.      calcitRIOL (ROCALTROL) 0.25 MCG capsule 1 capsule once daily     calcium acetate (PHOSLO) 667 MG tablet Take by mouth.     CINNAMON PO Take 1,000 mg by mouth.     clindamycin (CLEOCIN)  300 MG capsule Take 300 mg by mouth.     clopidogrel  (PLAVIX ) 75 MG tablet Take 75 mg by mouth daily with lunch.      co-enzyme Q-10 30 MG capsule Take 30 mg by mouth daily.     ergocalciferol (VITAMIN D2) 1.25 MG (50000 UT) capsule Take by mouth.     furosemide  (LASIX ) 40 MG tablet Take 20 mg by mouth once a week. Every other day with lunch     gabapentin (NEURONTIN) 100 MG capsule Take 100 mg by mouth at bedtime.      Garlic 1000 MG CAPS Take by mouth.     hydrOXYzine (ATARAX) 25 MG tablet TAKE 1 TABLET (25 MG TOTAL) BY MOUTH ONCE DAILY AS NEEDED FOR ITCHING FOR UP TO 90 DAYS     hyoscyamine (LEVSIN SL) 0.125 MG SL tablet Take 0.125 mg by mouth daily.     isosorbide  mononitrate (IMDUR ) 60 MG 24 hr tablet Take 60 mg by mouth daily.      Magnesium Citrate 100 MG TABS Take 1 tablet by mouth daily.     MAGNESIUM CITRATE PO Take 100 mg by mouth daily.      metoprolol  succinate (TOPROL -XL) 25 MG 24 hr tablet Take 25 mg by mouth daily with lunch. 1/2 tab QD     nitroGLYCERIN   (NITROSTAT ) 0.4 MG SL tablet Place 0.4 mg under the tongue as needed for chest pain.     ondansetron  (ZOFRAN -ODT) 4 MG disintegrating tablet Take 4 mg by mouth 3 (three) times daily as needed.     pantoprazole  (PROTONIX ) 40 MG tablet Take 40 mg by mouth daily before breakfast.      potassium chloride  (KLOR-CON  M) 10 MEQ tablet Take by mouth.     potassium chloride  (MICRO-K ) 10 MEQ CR capsule Take 20 mEq by mouth in the morning and at bedtime.      promethazine  (PHENERGAN ) 25 MG tablet Take 25 mg by mouth every 8 (eight) hours as needed for nausea or vomiting.      ranolazine (RANEXA) 500 MG 12 hr tablet Take by mouth.     riboflavin (VITAMIN B-2) 100 MG TABS tablet Take 100 mg by mouth daily with lunch.      senna (SENOKOT) 8.6 MG tablet Take 4-6 tablets by mouth at bedtime.      sodium bicarbonate 650 MG tablet Take 650 mg by mouth daily with lunch.     vitamin E 400 UNIT capsule Take 400 Units by mouth daily with lunch.      No current facility-administered medications for this visit.    OBJECTIVE: Vitals:   02/15/24 1355  BP: (!) 100/57  Pulse: 81  Resp: 18  Temp: 98.6 F (37 C)  SpO2: 99%     Body mass index is 20.63 kg/m.    ECOG FS:0 - Asymptomatic  General: Well-developed, well-nourished, no acute distress. Eyes: Pink conjunctiva, anicteric sclera. HEENT: Normocephalic, moist mucous membranes. Lungs: No audible wheezing or coughing. Heart: Regular rate and rhythm. Abdomen: Soft, nontender, no obvious distention. Musculoskeletal: No edema, cyanosis, or clubbing. Neuro: Alert, answering all questions appropriately. Cranial nerves grossly intact. Skin: No rashes or petechiae noted. Psych: Normal affect.  LAB RESULTS:  Lab Results  Component Value Date   NA 139 01/03/2024   K 3.3 (L) 01/03/2024   CL 99 01/03/2024   CO2 22 01/03/2024   GLUCOSE 131 (H) 01/03/2024   BUN 62 (H) 01/03/2024   CREATININE 4.62 (H) 01/03/2024   CALCIUM 10.5 (H)  01/03/2024   PROT 7.4  01/03/2024   ALBUMIN 4.4 01/03/2024   AST 31 01/03/2024   ALT 25 01/03/2024   ALKPHOS 37 (L) 01/03/2024   BILITOT 0.7 01/03/2024   GFRNONAA 9 (L) 01/03/2024   GFRAA 39 (L) 08/09/2017    Lab Results  Component Value Date   WBC 11.9 (H) 02/15/2024   NEUTROABS 9.1 (H) 02/15/2024   HGB 10.5 (L) 02/15/2024   HCT 33.0 (L) 02/15/2024   MCV 92.7 02/15/2024   PLT 275 02/15/2024   Lab Results  Component Value Date   IRON  74 11/17/2023   TIBC 381 11/17/2023   IRONPCTSAT 19 11/17/2023   Lab Results  Component Value Date   FERRITIN 32 11/17/2023     STUDIES: No results found.  ASSESSMENT: Anemia secondary to chronic renal insufficiency.  PLAN:    Anemia secondary to chronic renal insufficiency: Patient's hemoglobin has improved to 10.5 today.  Previously, iron  stores were within normal limits.  She does not require additional Aranesp .  It appears patient only requires treatment every other month.  Return to clinic in 2 and 4 months for repeat laboratory work and 200 mcg Aranesp  if her hemoglobin falls below 10.0.  She would then return to clinic in 6 months for repeat laboratory work, further evaluation, and continuation of treatment if needed.   Chronic renal insufficiency: Patient's most recent GFR was reported 12.  Continue follow-up with nephrology as scheduled. Cardiac disease: Previously, no further cardiac catheterizations were recommended given her worsening renal function.  Continue medical management and follow-up with cardiology as scheduled.  I spent a total of 20 minutes reviewing chart data, face-to-face evaluation with the patient, counseling and coordination of care as detailed above.   Patient expressed understanding and was in agreement with this plan. She also understands that She can call clinic at any time with any questions, concerns, or complaints.    Evalene JINNY Reusing, MD   02/16/2024 8:50 AM

## 2024-02-15 NOTE — Progress Notes (Signed)
 Hgb 10.5 today, no aranesp  given.

## 2024-02-16 ENCOUNTER — Encounter: Payer: Self-pay | Admitting: Oncology

## 2024-03-01 ENCOUNTER — Encounter: Payer: Self-pay | Admitting: Oncology

## 2024-03-06 ENCOUNTER — Encounter: Payer: Self-pay | Admitting: Oncology

## 2024-03-06 ENCOUNTER — Other Ambulatory Visit: Payer: Self-pay

## 2024-03-06 MED ORDER — FLUZONE HIGH-DOSE 0.5 ML IM SUSY
0.5000 mL | PREFILLED_SYRINGE | Freq: Once | INTRAMUSCULAR | 0 refills | Status: AC
  Filled 2024-03-06: qty 0.5, 1d supply, fill #0

## 2024-03-11 ENCOUNTER — Ambulatory Visit (INDEPENDENT_AMBULATORY_CARE_PROVIDER_SITE_OTHER): Admitting: Vascular Surgery

## 2024-03-11 ENCOUNTER — Encounter: Payer: Self-pay | Admitting: Radiology

## 2024-03-11 ENCOUNTER — Encounter (INDEPENDENT_AMBULATORY_CARE_PROVIDER_SITE_OTHER): Payer: Self-pay | Admitting: Vascular Surgery

## 2024-03-11 ENCOUNTER — Ambulatory Visit (INDEPENDENT_AMBULATORY_CARE_PROVIDER_SITE_OTHER)

## 2024-03-11 VITALS — BP 117/72 | HR 84 | Resp 17 | Ht 59.0 in | Wt 103.0 lb

## 2024-03-11 DIAGNOSIS — N184 Chronic kidney disease, stage 4 (severe): Secondary | ICD-10-CM

## 2024-03-11 DIAGNOSIS — I701 Atherosclerosis of renal artery: Secondary | ICD-10-CM

## 2024-03-11 DIAGNOSIS — I6523 Occlusion and stenosis of bilateral carotid arteries: Secondary | ICD-10-CM

## 2024-03-11 DIAGNOSIS — I10 Essential (primary) hypertension: Secondary | ICD-10-CM | POA: Diagnosis not present

## 2024-03-11 DIAGNOSIS — I25118 Atherosclerotic heart disease of native coronary artery with other forms of angina pectoris: Secondary | ICD-10-CM | POA: Diagnosis not present

## 2024-03-11 NOTE — Progress Notes (Signed)
 MRN : [id number]  [name] is a 78 y.o. (1945-11-18) female who presents with chief complaint of check carotid arteries.  History of Present Illness:   The patient is seen for follow up evaluation of carotid stenosis. The carotid stenosis followed by ultrasound.    The patient denies amaurosis fugax. There is no recent history of TIA symptoms or focal motor deficits. There is no prior documented CVA.   The patient is taking enteric-coated aspirin  325 mg daily and Plavix    There is no history of migraine headaches. There is no history of seizures.   No recent shortening of the patient's walking distance or new symptoms consistent with claudication.  No history of rest pain symptoms. No new ulcers or wounds of the lower extremities have occurred.   There is no history of DVT, PE or superficial thrombophlebitis. No documented recent episodes of angina or shortness of breath documented.    Carotid Duplex done today shows 1-39% stenosis of the right ICA and 40-59% stenosis of the left ICA.  No change when compared to the previous study.  No outpatient medications have been marked as taking for the 03/11/24 encounter (Appointment) with Jama, Cordella MATSU, MD.    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   CARDIAC CATHETERIZATION N/A 10/21/2015   Procedure: Left Heart Cath and Coronary Angiography;  Surgeon: Marsa Dooms, MD;  Location: [facility]  IN[facility];  Service: Cardiovascular;  Laterality: N/A;   CARDIAC CATHETERIZATION N/A 10/21/2015   Procedure: Coronary Stent Intervention;  Surgeon: Marsa Dooms, MD;  Location: [facility];  Service: Cardiovascular;  Laterality: N/A;   CARDIAC CATHETERIZATION Left 11/24/2015   Procedure: Coronary Stent Intervention;  Surgeon: Marsa Dooms, MD;  Location: [facility];  Service: Cardiovascular;  Laterality: Left;   CARDIAC SURGERY  2007   cardiac stent place   CATARACT EXTRACTION  2012   CHOLECYSTECTOMY     CORONARY ANGIOPLASTY WITH STENT PLACEMENT  2013   CORONARY ARTERY BYPASS GRAFT  2008   CORONARY STENT INTERVENTION N/A 07/04/2017   Procedure: CORONARY STENT INTERVENTION;  Surgeon: Dooms Marsa, MD;  Location: [facility];  Service: Cardiovascular;  Laterality: N/A;   CORONARY STENT INTERVENTION N/A 08/08/2017   Procedure: CORONARY STENT INTERVENTION;  Surgeon: Dooms Marsa, MD;  Location: [facility];  Service: Cardiovascular;  Laterality: N/A;   CYSTOSCOPY W/ RETROGRADES Bilateral 12/29/2014   Procedure: CYSTOSCOPY WITH RETROGRADE PYELOGRAM;  Surgeon: Charlie JONETTA Pack, MD;  Location: [facility];  Service: Urology;  Laterality: Bilateral;   CYSTOSCOPY WITH BIOPSY N/A 12/29/2014   Procedure: CYSTOSCOPY WITH BIOPSY;  Surgeon: Charlie JONETTA Pack, MD;  Location: [facility];  Service: Urology;  Laterality: N/A;   HAND SURGERY  1998   LEFT HEART CATH AND CORONARY ANGIOGRAPHY Left 08/08/2017   Procedure: LEFT HEART CATH AND CORONARY ANGIOGRAPHY;  Surgeon: Ammon Blunt, MD;  Location: [facility];  Service: Cardiovascular;  Laterality: Left;   LEFT HEART CATH AND CORONARY ANGIOGRAPHY Left 08/01/2019   Procedure: LEFT HEART CATH AND CORONARY ANGIOGRAPHY;  Surgeon: Ammon Blunt, MD;  Location: [facility];  Service: Cardiovascular;  Laterality: Left;   LEFT HEART CATH AND CORS/GRAFTS ANGIOGRAPHY N/A  07/04/2017   Procedure: LEFT HEART CATH AND CORS/GRAFTS ANGIOGRAPHY;  Surgeon: Ammon Blunt, MD;  Location: [facility];  Service: Cardiovascular;  Laterality: N/A;   SIGMOID RESECTION / RECTOPEXY  2006   SKIN CANCER EXCISION  2013   TENDON REPAIR     right elbow   TRIGGER FINGER RELEASE      Social History Social History   Tobacco Use   Smoking status: Never    Passive exposure: Never   Smokeless tobacco: Never  Vaping Use   Vaping status: Never Used  Substance Use Topics   Alcohol use: No   Drug use: No    Family History Family History  Problem Relation Age of Onset   Kidney Stones Father    Prostate cancer Father    Heart disease Father    Kidney Stones Brother    Heart disease Mother     Allergies  Allergen Reactions   Prochlorperazine Nausea And Vomiting, Other (See Comments) and Nausea Only    Severe vomiting   Ezetimibe Other (See Comments)    Muscle Pain   Statins Other (See Comments)    Muscle Pain   Tape Other (See Comments)    Pulls my skin off when the tape is removed. Use paper tape only.   Cephalexin Rash   Metoclopramide Rash and Other (See Comments)    Elevates BP, face draws to one side   Penicillins Rash and Other (See Comments)    Has patient had a PCN reaction causing immediate rash, facial/tongue/throat swelling, SOB or lightheadedness with hypotension: Yes Has patient had a PCN reaction causing severe rash involving mucus membranes or skin necrosis: Yes Has patient had a PCN reaction that required hospitalization No Has patient had a PCN reaction occurring within the last 10 years: No If all of the above answers are NO, then may proceed with Cephalosporin use.      REVIEW OF SYSTEMS (Negative unless checked)  Constitutional: [] Weight loss  [] Fever  [] Chills Cardiac: [] Chest pain   [] Chest pressure   [] Palpitations   [] Shortness of breath when laying flat   [] Shortness of breath with exertion. Vascular:  [x] Pain in  legs with walking   [] Pain in legs at rest  [] History of DVT   [] Phlebitis   [] Swelling in legs   [] Varicose veins   [] Non-healing ulcers Pulmonary:   [] Uses home oxygen   [] Productive cough   [] Hemoptysis   [] Wheeze  [] COPD   [] Asthma Neurologic:  [] Dizziness   [] Seizures   [] History of stroke   [] History of TIA  [] Aphasia   [] Vissual changes   [] Weakness or numbness in arm   [] Weakness or numbness in leg Musculoskeletal:   [] Joint swelling   [] Joint pain   [] Low back pain Hematologic:  [] Easy bruising  [] Easy bleeding   [] Hypercoagulable state   [] Anemic Gastrointestinal:  [] Diarrhea   [] Vomiting  [] Gastroesophageal reflux/heartburn   [] Difficulty swallowing. Genitourinary:  [] Chronic kidney disease   []   Difficult urination  [] Frequent urination   [] Blood in urine Skin:  [] Rashes   [] Ulcers  Psychological:  [] History of anxiety   []  History of major depression.  Physical Examination  There were no vitals filed for this visit. There is no height or weight on file to calculate BMI. Gen: WD/WN, NAD Head: [place]/AT, No temporalis wasting.  Ear/Nose/Throat: Hearing grossly intact, nares w/o erythema or drainage Eyes: PER, EOMI, sclera nonicteric.  Neck: Supple, no masses.  No bruit or JVD.  Pulmonary:  Good air movement, no audible wheezing, no use of accessory muscles.  Cardiac: RRR, normal S1, S2, no Murmurs. Vascular:  carotid bruit noted Vessel Right Left  Radial Palpable Palpable  Carotid  Palpable  Palpable  Gastrointestinal: soft, non-distended. No guarding/no peritoneal signs.  Musculoskeletal: M/S 5/5 throughout.  No visible deformity.  Neurologic: CN 2-12 intact. Pain and light touch intact in extremities.  Symmetrical.  Speech is fluent. Motor exam as listed above. Psychiatric: Judgment intact, Mood & affect appropriate for pt's clinical situation. Dermatologic: No rashes or ulcers noted.  No changes consistent with cellulitis.   CBC Lab Results  Component Value Date   WBC 11.9  (H) 02/15/2024   HGB 10.5 (L) 02/15/2024   HCT 33.0 (L) 02/15/2024   MCV 92.7 02/15/2024   PLT 275 02/15/2024    BMET    Component Value Date/Time   NA 139 01/03/2024 0128   NA 137 05/24/2013 0357   K 3.3 (L) 01/03/2024 0128   K 2.3 (LL) 05/24/2013 0357   CL 99 01/03/2024 0128   CL 97 (L) 05/24/2013 0357   CO2 22 01/03/2024 0128   CO2 36 (H) 05/24/2013 0357   GLUCOSE 131 (H) 01/03/2024 0128   GLUCOSE 91 05/24/2013 0357   BUN 62 (H) 01/03/2024 0128   BUN 21 (H) 05/24/2013 0357   CREATININE 4.62 (H) 01/03/2024 0128   CREATININE 1.32 (H) 05/24/2013 0357   CALCIUM 10.5 (H) 01/03/2024 0128   CALCIUM 7.9 (L) 05/24/2013 0357   GFRNONAA 9 (L) 01/03/2024 0128   GFRNONAA 42 (L) 05/24/2013 0357   GFRAA 39 (L) 08/09/2017 0409   GFRAA 49 (L) 05/24/2013 0357   CrCl cannot be calculated (Patient's most recent lab result is older than the maximum 21 days allowed.).  COAG Lab Results  Component Value Date   INR 1.5 12/04/2006   INR 0.9 11/30/2006    Radiology No results found.   Assessment/Plan 1. Bilateral carotid artery stenosis (Primary) Recommend:   Given the patient's asymptomatic subcritical stenosis no further invasive testing or surgery at this time.   Duplex ultrasound shows 1-39% stenosis of the right ICA and 40-59% stenosis of the left ICA   Continue antiplatelet therapy as prescribed Continue management of CAD, HTN and Hyperlipidemia Healthy heart diet,  encouraged exercise at least 4 times per week   Follow up in 6 months with duplex ultrasound and physical exam  - VAS US  CAROTID; Future  2. Renal artery stenosis Recommend:   Given patient's arterial disease optimal control of the patient's hypertension is important. BP is acceptable today, 117/72 mmHg   The patient's vital signs and noninvasive studies support the renal artery stenosis is not significantly increased when compared to the previous study.   No invasive studies or intervention is indicated  at this time.   The patient will continue the current antihypertensive medications, no changes at this time.   The primary medical service will continue aggressive antihypertensive therapy as per the AHA guidelines.  Patient will follow-up with duplex ultrasound of the renal arteries as ordered.   3. Coronary artery disease of native artery of native heart with stable angina pectoris Continue cardiac and antihypertensive medications as already ordered and reviewed, no changes at this time.  Continue statin as ordered and reviewed, no changes at this time  Nitrates PRN for chest pain  4. Primary hypertension Continue antihypertensive medications as already ordered, these medications have been reviewed and there are no changes at this time.  5. CKD (chronic kidney disease) stage 4, GFR 15-29 ml/min (HCC) The patient has advanced renal disease.  However, at the present time the patient is not yet on dialysis.   Avoid nephrotoxic medications and dehydration.  Further plans per nephrology    Cordella Shawl, MD  03/11/2024 11:03 AM

## 2024-03-14 ENCOUNTER — Telehealth: Payer: Self-pay | Admitting: Oncology

## 2024-03-14 NOTE — Telephone Encounter (Signed)
 Per in basket states-(Can we please schedule her for lab/aranesp  next week? Thanks!)  I called the pt to get her scheduled and the pt states she will have to call me back once she finds her calendar to make sure she doesn't have other appts scheduled for next week.

## 2024-03-15 ENCOUNTER — Other Ambulatory Visit: Payer: Self-pay | Admitting: *Deleted

## 2024-03-15 DIAGNOSIS — D631 Anemia in chronic kidney disease: Secondary | ICD-10-CM

## 2024-03-18 ENCOUNTER — Inpatient Hospital Stay: Attending: Oncology

## 2024-03-18 ENCOUNTER — Inpatient Hospital Stay

## 2024-03-18 VITALS — BP 90/58

## 2024-03-18 DIAGNOSIS — N184 Chronic kidney disease, stage 4 (severe): Secondary | ICD-10-CM | POA: Diagnosis present

## 2024-03-18 DIAGNOSIS — D631 Anemia in chronic kidney disease: Secondary | ICD-10-CM

## 2024-03-18 LAB — CBC WITH DIFFERENTIAL/PLATELET
Abs Immature Granulocytes: 0.13 K/uL — ABNORMAL HIGH (ref 0.00–0.07)
Basophils Absolute: 0.1 K/uL (ref 0.0–0.1)
Basophils Relative: 1 %
Eosinophils Absolute: 0.3 K/uL (ref 0.0–0.5)
Eosinophils Relative: 4 %
HCT: 25.4 % — ABNORMAL LOW (ref 36.0–46.0)
Hemoglobin: 8 g/dL — ABNORMAL LOW (ref 12.0–15.0)
Immature Granulocytes: 2 %
Lymphocytes Relative: 24 %
Lymphs Abs: 2.1 K/uL (ref 0.7–4.0)
MCH: 31.6 pg (ref 26.0–34.0)
MCHC: 31.5 g/dL (ref 30.0–36.0)
MCV: 100.4 fL — ABNORMAL HIGH (ref 80.0–100.0)
Monocytes Absolute: 0.9 K/uL (ref 0.1–1.0)
Monocytes Relative: 11 %
Neutro Abs: 5 K/uL (ref 1.7–7.7)
Neutrophils Relative %: 58 %
Platelets: 222 K/uL (ref 150–400)
RBC: 2.53 MIL/uL — ABNORMAL LOW (ref 3.87–5.11)
RDW: 18.9 % — ABNORMAL HIGH (ref 11.5–15.5)
WBC: 8.5 K/uL (ref 4.0–10.5)
nRBC: 0 % (ref 0.0–0.2)

## 2024-03-18 MED ORDER — DARBEPOETIN ALFA 200 MCG/0.4ML IJ SOSY
200.0000 ug | PREFILLED_SYRINGE | Freq: Once | INTRAMUSCULAR | Status: AC
  Administered 2024-03-18: 200 ug via SUBCUTANEOUS
  Filled 2024-03-18: qty 0.4

## 2024-03-19 NOTE — Progress Notes (Unsigned)
 Error when I gave patient injection yesterday

## 2024-03-28 ENCOUNTER — Other Ambulatory Visit: Payer: Self-pay

## 2024-03-28 ENCOUNTER — Ambulatory Visit: Attending: Neurology | Admitting: Physical Therapy

## 2024-03-28 ENCOUNTER — Encounter: Payer: Self-pay | Admitting: Physical Therapy

## 2024-03-28 DIAGNOSIS — M6281 Muscle weakness (generalized): Secondary | ICD-10-CM | POA: Diagnosis present

## 2024-03-28 DIAGNOSIS — R262 Difficulty in walking, not elsewhere classified: Secondary | ICD-10-CM | POA: Insufficient documentation

## 2024-03-28 DIAGNOSIS — R2689 Other abnormalities of gait and mobility: Secondary | ICD-10-CM | POA: Diagnosis present

## 2024-03-28 DIAGNOSIS — R296 Repeated falls: Secondary | ICD-10-CM | POA: Insufficient documentation

## 2024-03-28 NOTE — Therapy (Signed)
 OUTPATIENT PHYSICAL THERAPY NEURO EVALUATION   Patient Name: [name] MRN: [id number] DOB:18-Oct-1945, 78 y.o., female Today's Date: 03/28/2024   PCP: Sadie Manna, MD  REFERRING PROVIDER: Maree Jannett MARLA, MD  END OF SESSION:  PT End of Session - 03/28/24 1449     Visit Number 1    Number of Visits 24    Date for Recertification  06/20/24    PT Start Time 1450    PT Stop Time 1530    PT Time Calculation (min) 40 min    Equipment Utilized During Treatment Gait belt    Activity Tolerance Patient tolerated treatment well    Behavior During Therapy WFL for tasks assessed/performed          Past Medical History:  Diagnosis Date   Acid reflux    Anemia    CAD (coronary artery disease)    Chronic constipation    Chronic kidney disease    stage 3 chronic kidney disease   Eczema    H/O heart artery stent    Hyperlipidemia    Hypertension    IBS (irritable bowel syndrome) 2004   Migraine    Mitral valve prolapse    Peptic ulcer 1989   Skin cancer    Past Surgical History:  Procedure Laterality Date   ABDOMINAL HYSTERECTOMY     APPENDECTOMY     BREAST BIOPSY Left 07/14/2021   left stereo bx ribbon clip path stromal fibrosis   CARDIAC CATHETERIZATION N/A 10/21/2015   Procedure: Left Heart Cath and Coronary Angiography;  Surgeon: Marsa Dooms, MD;  Location: [facility];  Service: Cardiovascular;  Laterality: N/A;   CARDIAC CATHETERIZATION N/A 10/21/2015   Procedure: Coronary Stent Intervention;  Surgeon: Marsa Dooms, MD;  Location: [facility];  Service: Cardiovascular;  Laterality: N/A;   CARDIAC CATHETERIZATION Left 11/24/2015   Procedure: Coronary Stent Intervention;  Surgeon: Marsa Dooms, MD;  Location: [facility];  Service: Cardiovascular;  Laterality: Left;   CARDIAC SURGERY  2007   cardiac stent place   CATARACT EXTRACTION  2012   CHOLECYSTECTOMY     CORONARY ANGIOPLASTY WITH STENT PLACEMENT   2013   CORONARY ARTERY BYPASS GRAFT  2008   CORONARY STENT INTERVENTION N/A 07/04/2017   Procedure: CORONARY STENT INTERVENTION;  Surgeon: Dooms Marsa, MD;  Location: [facility];  Service: Cardiovascular;  Laterality: N/A;   CORONARY STENT INTERVENTION N/A 08/08/2017   Procedure: CORONARY STENT INTERVENTION;  Surgeon: Dooms Marsa, MD;  Location: [facility];  Service: Cardiovascular;  Laterality: N/A;   CYSTOSCOPY W/ RETROGRADES Bilateral 12/29/2014   Procedure: CYSTOSCOPY WITH RETROGRADE PYELOGRAM;  Surgeon: Charlie JONETTA Pack, MD;  Location: [facility];  Service: Urology;  Laterality: Bilateral;   CYSTOSCOPY WITH BIOPSY N/A 12/29/2014   Procedure: CYSTOSCOPY WITH BIOPSY;  Surgeon: Charlie JONETTA Pack, MD;  Location: [facility];  Service: Urology;  Laterality: N/A;   HAND SURGERY  1998   LEFT HEART CATH AND CORONARY ANGIOGRAPHY Left 08/08/2017   Procedure: LEFT HEART CATH AND CORONARY ANGIOGRAPHY;  Surgeon: Dooms Marsa, MD;  Location: [facility];  Service: Cardiovascular;  Laterality: Left;   LEFT HEART CATH AND CORONARY ANGIOGRAPHY Left 08/01/2019   Procedure: LEFT HEART CATH AND CORONARY ANGIOGRAPHY;  Surgeon: Dooms Marsa, MD;  Location: [facility];  Service: Cardiovascular;  Laterality: Left;   LEFT HEART CATH AND CORS/GRAFTS ANGIOGRAPHY N/A 07/04/2017   Procedure: LEFT HEART CATH AND CORS/GRAFTS ANGIOGRAPHY;  Surgeon: Dooms Marsa,  MD;  Location: [facility];  Service: Cardiovascular;  Laterality: N/A;   SIGMOID RESECTION / RECTOPEXY  2006   SKIN CANCER EXCISION  2013   TENDON REPAIR     right elbow   TRIGGER FINGER RELEASE     Patient Active Problem List   Diagnosis Date Noted   Age-related osteoporosis without current pathological fracture 09/20/2023   Chronic midline low back pain with bilateral sciatica 05/24/2023   Spondylolisthesis at L5-S1 level 05/24/2023   Right leg pain 05/24/2023   Right leg  weakness 05/24/2023   Anemia of chronic renal failure, stage 4 (severe) (HCC) 11/02/2020   Hypokalemia 12/09/2019   Carotid artery stenosis 07/05/2019   Palpitations 06/24/2019   Dyspnea on exertion 06/24/2019   Hyperparathyroidism due to renal insufficiency 03/10/2019   Renal artery stenosis 03/10/2019   Cardiac syncope 02/16/2016   Unstable angina (HCC) 11/24/2015   CAD (coronary artery disease) 10/21/2015   S/P cardiac catheterization 10/21/2015   Iron  deficiency anemia 09/14/2015   Chest pain 08/20/2015   Hematuria 12/17/2014   Allergic condition 11/04/2014   Mitral valve prolapse 11/04/2014   Peptic ulcer 11/04/2014   Gross hematuria 11/04/2014   Atrophic vaginitis 11/04/2014   Hyperlipidemia 12/11/2013   Intolerance of drug 12/11/2013   Regurgitation 12/11/2013   Coronary atherosclerosis of autologous vein bypass graft 12/11/2013   Hypertension 12/11/2013   Presence of coronary angioplasty implant and graft 12/11/2013   Statin intolerance 12/11/2013   Other specified health status 12/11/2013   Adverse effect of unspecified drugs, medicaments and biological substances, initial encounter 12/11/2013   Anemia 09/16/2013   Chronic urinary tract infection 09/16/2013   Irritable bowel syndrome (IBS) 09/16/2013   CKD (chronic kidney disease) stage 4, GFR 15-29 ml/min (HCC) 09/16/2013   Echocardiogram abnormal 08/02/2007   Arteriosclerosis of coronary artery 09/28/2005   Atherosclerotic heart disease of native coronary artery without angina pectoris 09/28/2005   Gastro-esophageal reflux disease without esophagitis 06/30/2003    ONSET DATE: since July 2025 balance has started decreasing  REFERRING DIAG: R26.89 (ICD-10-CM) - Other abnormalities of gait and mobility  THERAPY DIAG:  Abnormality of gait due to impairment of balance  Impaired ambulation  Frequent falls  Muscle weakness (generalized)  Rationale for Evaluation and Treatment: Rehabilitation  SUBJECTIVE:                                                                                                                                                                                              SUBJECTIVE STATEMENT: Pt reports feeling okay today. Pt reports blurry vision consistently from a film over eyes from past eye surgery  causing blurriness. Pt reports towards the end of July 2025 she began having difficulty with speech and getting words out. Pt reports sensory loss and tingling in the R and L LE, more so in the R. Reports it is the most severe in the R and L lateral border of her feet, and up the lateral sides of her calf, and to her knees. Pt reports most of her falls occur in the backwards direction. Reports she has had regular headaches everyday for a very long time. Reports she feels like she's floating when she gets up to walk when asked to describe her dizziness. Pt states she uses a SPC and a rollator with a seat while ambulating around her home and in the community. Seated Vitals- BP:116/58 HR: 76  Standing Vitals- BP: 122/54  HR 73  PERTINENT HISTORY:  From AMB refferral 03/05/24: History and Present Illness: Patient states she has been having issues with her speech, gait, and dizziness since around July 2025. Symptoms came on gradually. Speech has gotten better but dizziness and gait have gotten worse. She has some associated headaches. No associated tinnitus or leg weakness. MRI was negative for stroke. Sensation of 'staggering' when walking. No orthostasis. Assessment and Plan: Gait disturbance and imbalance with dizziness due to silent stroke and moderate microvascular ischemic brain changes. She is at higher risk for falls and further strokes. Lack of movement exacerbates balance issues. Encourage use of walker with a seat to prevent falls and promote mobility. Advise reducing prolonged sitting and screen time to improve balance and prevent further deterioration. 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  PAIN:  Are you having pain? No  PRECAUTIONS: None  RED FLAGS: None   WEIGHT BEARING RESTRICTIONS: No  FALLS: Has patient fallen in last 6 months? Yes. Number of falls 5, reports they were due to fumbling of her feet and one time was from the stairs into the garage; reports most falls occur in the backwards direction  LIVING ENVIRONMENT: Lives with: lives alone Lives in: House/apartment Stairs: 3 steps into the house, 3 on the sides, rails on both sets Has following equipment at home: Single point cane and rollator  PLOF: Independent  PATIENT GOALS: She would like to be able to walk without getting dizzy or fumbling her feet like she's drunk all the time.  OBJECTIVE:  Note: Objective measures were completed at Evaluation unless otherwise noted.  DIAGNOSTIC FINDINGS:  From 11/22/2023 CEREBRAL MRI WO Contrast IMPRESSION: 1. No acute intracranial abnormality. 2. Moderate chronic small vessel ischemic disease.  From Xray of Lumbar Spine on 08/30/2023: XRs of the lumbar spine from 08/30/2023 were independently reviewed and  interpreted, showing grade 2 spondylolisthesis at L5/S1. Disc height loss  at L5/S1. No other significant degenerative changes noted. No fracture or  dislocation seen.     COGNITION: Overall cognitive status: Within functional limits for tasks assessed   SENSATION:  Light touch: Impaired  R side:  decreased sensation on upper lateral thigh,complete lack of sensation on lateral ankle, foot, and toes L side: lateral calf decreased sensation, lateral border of feet decreased  COORDINATION: H<>S= WFL RAMPS: WFL  POSTURE: rounded shoulders, forward head, decreased lumbar lordosis, increased thoracic kyphosis, and flexed trunk   LOWER EXTREMITY MMT:    MMT Right Eval Left Eval  Hip flexion 3 4  Hip extension    Hip abduction 4+ 4+  Hip adduction 4+ 4+  Hip internal rotation    Hip external  rotation  Knee flexion 4 4+  Knee extension 4 4+  Ankle dorsiflexion 3+ 4+  Ankle plantarflexion    Ankle inversion    Ankle eversion    (Blank rows = not tested)   TRANSFERS: Sit to stand: Complete Independence  Assistive device utilized: None     Stand to sit: Complete Independence  Assistive device utilized: None       STAIRS: Not tested GAIT: Findings: Gait Characteristics: decreased step length- Right, decreased step length- Left, decreased stride length, lateral hip instability, decreased trunk rotation, trunk flexed, and narrow BOS, Distance walked: distance into and out of clinic. Distance taken to perform functional outcome measures, Assistive device utilized:Walker - 4 wheeled, Level of assistance: CGA and Min A, and Comments: Pt showed significant decrease in postural control during ambulation with shuffling and inversion/eversion of ankles for correction seen as well.  FUNCTIONAL TESTS:  -Pt performed 5 time sit<>stand (5xSTS): 9.80s with hands, 8.26s without hands (>15 sec indicates increased fall risk)    -10 Meter Walk Test: Patient instructed to walk 10 meters (32.8 ft) as quickly and as safely as possible at their normal speed x2 and at a fast speed x2. Time measured from 2 meter mark to 8 meter mark to accommodate ramp-up and ramp-down.  Normal speed 1 no AD: 13.14s  Normal speed 2 no AD: 11.91s  -Avg speed with no AD: 0.764m/s Normal speed 1 w/ rollator: 8.97s Normal speed 2 w/ rollator: 9.27s  -Avg speed w/ rollator: 1.066m/s Cut off scores: <0.4 m/s = household Ambulator, 0.4-0.8 m/s = limited community Ambulator, >0.8 m/s = community Ambulator, >1.2 m/s = crossing a street, <1.0 = increased fall risk MCID 0.05 m/s (small), 0.13 m/s (moderate), 0.06 m/s (significant)  (ANPTA Core Set of Outcome Measures for Adults with Neurologic Conditions, 2018)   -PT instructed pt in TUG: 11.575s avg. Trial 1:12.08s, Trial 2: 11.07s (average of 3 trials; >13.5 sec  indicates increased fall risk)  6 minute walk test: perform at follow up session**  Perform FGA at follow up session**  Berg Balance Scale:  Item Test date: 03/28/2024  Date:  Date:   Sitting to standing 4. able to stand without using hands and stabilize independently    2. Standing unsupported 1. needs several tries to stand 30 seconds unsupported    3. Sitting with back unsupported, feet supported 4. able to sit safely and securely for 2 minutes    4. Standing to sitting 4. sits safely with minimal use of hands    5. Pivot transfer  4. able to transfer safely with minor use of hands    6. Standing unsupported with eyes closed 0. needs help to keep from falling    7. Standing unsupported with feet together 2. able to place feet together independently but unable to hold for 30 seconds    8. Reaching forward with outstretched arms while standing 3. can reach forward 12 cm (5 inches)    9. Pick up object from the floor from standing 4. able to pick up slipper safely and easily    10. Turning to look behind over left and right shoulders while standing 2. turns sideways but only maintains balance    11. Turn 360 degrees 0. needs assistance while turning    12. Place alternate foot on step or stool while standing unsupported 1. able to complete > 2 steps needs minimal assist    13. Standing unsupported one foot in front 0. loses balance while stepping or standing  14. Standing on one leg 0. unable to try of needs assist to prevent fall      Total Score 29/56 Total Score:    Total Score:    Patient demonstrates increased fall risk as noted by score of 29  /56 on Berg Balance Scale.  (<36= high risk for falls, close to 100%; 37-45 significant >80%; 46-51 moderate >50%; 52-55 lower >25%)   PATIENT SURVEYS: administer ABC at follow up VITALS:    Seated Vitals- BP: 116/58 HR: 76         Standing Vitals- BP:122/54  HR: 73                                                                                                                 TREATMENT DATE: 03/28/2024    PATIENT EDUCATION: Education details: Pt educated in performance of functional tests and measures. PT discussed with sister in law pt history of multiple TIAs being the source of pt gait and balance impairments. Person educated: Patient and Sister in social worker Education method: Explanation Education comprehension: verbalized understanding  HOME EXERCISE PROGRAM: Administer at follow up appointment**  GOALS: Goals reviewed with patient? No   SHORT TERM GOALS: Target date: 04/25/2024   Patient will be independent in home exercise program to improve strength/mobility for better functional independence with ADLs. Baseline:  Goal status: INITIAL   LONG TERM GOALS: Target date: 06/20/2024   Patient will increase ABC scale score >80% to demonstrate better functional mobility and better confidence with ADLs.  Baseline:  Goal status: INITIAL   2.  Patient will increase Berg Balance score by > 6 points to demonstrate decreased fall risk during functional activities Baseline: 29 Goal status: INITIAL  3.  Patient will increase 10 meter walk test without using AD to >1.85m/s as to improve gait speed for better community ambulation and to reduce fall risk. Baseline: 0.78m/s Goal status: INITIAL  4.  Patient will reduce timed up and go to <11 seconds to reduce fall risk and demonstrate improved transfer/gait ability. Baseline: 11.575s Goal status: INITIAL  5.  Patient will increase FGA score by 4 points as to demonstrate reduced fall risk and improved dynamic gait balance for better safety with community/home ambulation.   Baseline: obtain at follow up session** Goal status: INITIAL  6. Pt will increase R LE MMT scores by 1 point in order to show improvement of strength for increased ability to perform functional activities and for stability during ambulation for fall prevention.  Baseline:  Goal status:  INITIAL  ASSESSMENT:  CLINICAL IMPRESSION:  Patient is a 78 y.o. female who was seen today for physical therapy evaluation and treatment for abnormalities of gait and mobility. Pt presents with significant difficulty with balance and postural control as seen during the BERG balance assessment and during ambulation. Pt shows significant lack of sensation in B LE, more so on the R side in the lateral foot, calf, and toes. Pt reported headaches, decreased vision, history of multiple TIAs, lack of sensation, and decreased LE  strength all appear as contributing factors to history of falls and ambulation difficulty. Pt BERG score of 29/56 classifies this patient as being at a high risk for falls. 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 However, her gait speed without using an AD shows 0.734m/s, classifying her as a limited community ambulator. Pt vital signs were taken during administration of the BERG balance test due to pt reported dizziness and feeling like she was floating. Pt vitals signs did not show any signs of orthostasis, and pt states her blood pressure is usually on the lower side. Pt required CGA-mod assist duirng all activities performed in evaluation. Pt will continue to benefit from skilled therapy to address remaining deficits in order to improve overall QoL and return to PLOF.     OBJECTIVE IMPAIRMENTS: Abnormal gait, decreased activity tolerance, decreased balance, decreased endurance, decreased knowledge of condition, decreased knowledge of use of DME, decreased mobility, difficulty walking, decreased strength, decreased safety awareness, dizziness, impaired sensation, impaired vision/preception, improper body mechanics, and postural dysfunction.   ACTIVITY LIMITATIONS: carrying, lifting, bending, standing, reach over head, and locomotion level  PARTICIPATION LIMITATIONS: community activity and yard work  PERSONAL  FACTORS: Age, Past/current experiences, Time since onset of injury/illness/exacerbation, and 3+ comorbidities: CAD, CKD, unstable angina are also affecting patient's functional outcome.   REHAB POTENTIAL: Good  CLINICAL DECISION MAKING: Evolving/moderate complexity  EVALUATION COMPLEXITY: Moderate  PLAN:  PT FREQUENCY: 1-2x/week  PT DURATION: 12 weeks  PLANNED INTERVENTIONS: [id number]- PT Re-evaluation, 97750- Physical Performance Testing, 97110-Therapeutic exercises, 97530- Therapeutic activity, 97112- Neuromuscular re-education, 97535- Self Care, [id number]- Manual therapy, [phone]- Gait training, [phone]- Orthotic Initial, [phone]- Orthotic/Prosthetic subsequent, [phone]- Aquatic Therapy, [phone]- Electrical stimulation (unattended), [phone] (1-2 muscles), [phone] (3+ muscles)- Dry Needling, Patient/Family education, Balance training, Stair training, Joint mobilization, Joint manipulation, Spinal manipulation, Spinal mobilization, Vestibular training, DME instructions, Wheelchair mobility training, Cryotherapy, and Moist heat  PLAN FOR NEXT SESSION:  -administer FGA -administer -administer ABC questionnaire  -implement HEP   Renna Helling, SPT 03/28/2024, 5:31 PM

## 2024-04-02 ENCOUNTER — Encounter: Payer: Self-pay | Admitting: Physical Therapy

## 2024-04-02 ENCOUNTER — Ambulatory Visit: Admitting: Physical Therapy

## 2024-04-02 DIAGNOSIS — R2689 Other abnormalities of gait and mobility: Secondary | ICD-10-CM | POA: Diagnosis not present

## 2024-04-02 DIAGNOSIS — R262 Difficulty in walking, not elsewhere classified: Secondary | ICD-10-CM

## 2024-04-02 DIAGNOSIS — R296 Repeated falls: Secondary | ICD-10-CM

## 2024-04-02 DIAGNOSIS — M6281 Muscle weakness (generalized): Secondary | ICD-10-CM

## 2024-04-02 NOTE — Therapy (Unsigned)
 OUTPATIENT PHYSICAL THERAPY TREATMENT   Patient Name: [name] MRN: [id number] DOB:11/17/1945, 78 y.o., female Today's Date: 04/02/2024   PCP: Sadie Manna, MD  REFERRING PROVIDER: Maree Jannett MARLA, MD  END OF SESSION:  PT End of Session - 04/02/24 1518     Visit Number 2    Number of Visits 24    Date for Recertification  06/20/24    PT Start Time 1530    PT Stop Time 1615    PT Time Calculation (min) 45 min    Equipment Utilized During Treatment Gait belt    Activity Tolerance Patient tolerated treatment well    Behavior During Therapy WFL for tasks assessed/performed          Past Medical History:  Diagnosis Date   Acid reflux    Anemia    CAD (coronary artery disease)    Chronic constipation    Chronic kidney disease    stage 3 chronic kidney disease   Eczema    H/O heart artery stent    Hyperlipidemia    Hypertension    IBS (irritable bowel syndrome) 2004   Migraine    Mitral valve prolapse    Peptic ulcer 1989   Skin cancer    Past Surgical History:  Procedure Laterality Date   ABDOMINAL HYSTERECTOMY     APPENDECTOMY     BREAST BIOPSY Left 07/14/2021   left stereo bx ribbon clip path stromal fibrosis   CARDIAC CATHETERIZATION N/A 10/21/2015   Procedure: Left Heart Cath and Coronary Angiography;  Surgeon: Marsa Dooms, MD;  Location: [facility];  Service: Cardiovascular;  Laterality: N/A;   CARDIAC CATHETERIZATION N/A 10/21/2015   Procedure: Coronary Stent Intervention;  Surgeon: Marsa Dooms, MD;  Location: [facility];  Service: Cardiovascular;  Laterality: N/A;   CARDIAC CATHETERIZATION Left 11/24/2015   Procedure: Coronary Stent Intervention;  Surgeon: Marsa Dooms, MD;  Location: [facility];  Service: Cardiovascular;  Laterality: Left;   CARDIAC SURGERY  2007   cardiac stent place   CATARACT EXTRACTION  2012   CHOLECYSTECTOMY     CORONARY ANGIOPLASTY WITH STENT PLACEMENT  2013    CORONARY ARTERY BYPASS GRAFT  2008   CORONARY STENT INTERVENTION N/A 07/04/2017   Procedure: CORONARY STENT INTERVENTION;  Surgeon: Dooms Marsa, MD;  Location: [facility];  Service: Cardiovascular;  Laterality: N/A;   CORONARY STENT INTERVENTION N/A 08/08/2017   Procedure: CORONARY STENT INTERVENTION;  Surgeon: Dooms Marsa, MD;  Location: [facility];  Service: Cardiovascular;  Laterality: N/A;   CYSTOSCOPY W/ RETROGRADES Bilateral 12/29/2014   Procedure: CYSTOSCOPY WITH RETROGRADE PYELOGRAM;  Surgeon: Charlie JONETTA Pack, MD;  Location: [facility];  Service: Urology;  Laterality: Bilateral;   CYSTOSCOPY WITH BIOPSY N/A 12/29/2014   Procedure: CYSTOSCOPY WITH BIOPSY;  Surgeon: Charlie JONETTA Pack, MD;  Location: [facility];  Service: Urology;  Laterality: N/A;   HAND SURGERY  1998   LEFT HEART CATH AND CORONARY ANGIOGRAPHY Left 08/08/2017   Procedure: LEFT HEART CATH AND CORONARY ANGIOGRAPHY;  Surgeon: Dooms Marsa, MD;  Location: [facility];  Service: Cardiovascular;  Laterality: Left;   LEFT HEART CATH AND CORONARY ANGIOGRAPHY Left 08/01/2019   Procedure: LEFT HEART CATH AND CORONARY ANGIOGRAPHY;  Surgeon: Dooms Marsa, MD;  Location: [facility];  Service: Cardiovascular;  Laterality: Left;   LEFT HEART CATH AND CORS/GRAFTS ANGIOGRAPHY N/A 07/04/2017   Procedure: LEFT HEART CATH AND CORS/GRAFTS ANGIOGRAPHY;  Surgeon: Dooms Marsa, MD;  Location: [facility];  Service: Cardiovascular;  Laterality: N/A;   SIGMOID RESECTION / RECTOPEXY  2006   SKIN CANCER EXCISION  2013   TENDON REPAIR     right elbow   TRIGGER FINGER RELEASE     Patient Active Problem List   Diagnosis Date Noted   Age-related osteoporosis without current pathological fracture 09/20/2023   Chronic midline low back pain with bilateral sciatica 05/24/2023   Spondylolisthesis at L5-S1 level 05/24/2023   Right leg pain 05/24/2023   Right leg weakness  05/24/2023   Anemia of chronic renal failure, stage 4 (severe) (HCC) 11/02/2020   Hypokalemia 12/09/2019   Carotid artery stenosis 07/05/2019   Palpitations 06/24/2019   Dyspnea on exertion 06/24/2019   Hyperparathyroidism due to renal insufficiency 03/10/2019   Renal artery stenosis 03/10/2019   Cardiac syncope 02/16/2016   Unstable angina (HCC) 11/24/2015   CAD (coronary artery disease) 10/21/2015   S/P cardiac catheterization 10/21/2015   Iron  deficiency anemia 09/14/2015   Chest pain 08/20/2015   Hematuria 12/17/2014   Allergic condition 11/04/2014   Mitral valve prolapse 11/04/2014   Peptic ulcer 11/04/2014   Gross hematuria 11/04/2014   Atrophic vaginitis 11/04/2014   Hyperlipidemia 12/11/2013   Intolerance of drug 12/11/2013   Regurgitation 12/11/2013   Coronary atherosclerosis of autologous vein bypass graft 12/11/2013   Hypertension 12/11/2013   Presence of coronary angioplasty implant and graft 12/11/2013   Statin intolerance 12/11/2013   Other specified health status 12/11/2013   Adverse effect of unspecified drugs, medicaments and biological substances, initial encounter 12/11/2013   Anemia 09/16/2013   Chronic urinary tract infection 09/16/2013   Irritable bowel syndrome (IBS) 09/16/2013   CKD (chronic kidney disease) stage 4, GFR 15-29 ml/min (HCC) 09/16/2013   Echocardiogram abnormal 08/02/2007   Arteriosclerosis of coronary artery 09/28/2005   Atherosclerotic heart disease of native coronary artery without angina pectoris 09/28/2005   Gastro-esophageal reflux disease without esophagitis 06/30/2003    ONSET DATE: since July 2025 balance has started decreasing  REFERRING DIAG: R26.89 (ICD-10-CM) - Other abnormalities of gait and mobility  THERAPY DIAG:  Muscle weakness (generalized)  Frequent falls  Impaired ambulation  Abnormality of gait due to impairment of balance  Rationale for Evaluation and Treatment: Rehabilitation  SUBJECTIVE:                                                                                                                                                                                              SUBJECTIVE STATEMENT:  04/02/2024: Pt reports pain in R foot and numbness in both feet after standing for too long. Pt reports swelling  in R foot, elevating it often and wearing compression stockings.   From Eval:Pt reports feeling okay today. Pt reports blurry vision consistently from a film over eyes from past eye surgery causing blurriness. Pt reports towards the end of July 2025 she began having difficulty with speech and getting words out. Pt reports sensory loss and tingling in the R and L LE, more so in the R. Reports it is the most severe in the R and L lateral border of her feet, and up the lateral sides of her calf, and to her knees. Pt reports most of her falls occur in the backwards direction. Reports she has had regular headaches everyday for a very long time. Reports she feels like she's floating when she gets up to walk when asked to describe her dizziness. Pt states she uses a SPC and a rollator with a seat while ambulating around her home and in the community. Seated Vitals- BP:116/58 HR: 76  Standing Vitals- BP: 122/54  HR 73  PERTINENT HISTORY:  From AMB refferral 03/05/24: History and Present Illness: Patient states she has been having issues with her speech, gait, and dizziness since around July 2025. Symptoms came on gradually. Speech has gotten better but dizziness and gait have gotten worse. She has some associated headaches. No associated tinnitus or leg weakness. MRI was negative for stroke. Sensation of 'staggering' when walking. No orthostasis. Assessment and Plan: Gait disturbance and imbalance with dizziness due to silent stroke and moderate microvascular ischemic brain changes. She is at higher risk for falls and further strokes. Lack of movement exacerbates balance issues. Encourage use of walker  with a seat to prevent falls and promote mobility. Advise reducing prolonged sitting and screen time to improve balance and prevent further deterioration. 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  PAIN:  Are you having pain? No R foot pain, numbness in both feet  PRECAUTIONS: None  RED FLAGS: None   WEIGHT BEARING RESTRICTIONS: No  FALLS: Has patient fallen in last 6 months? Yes. Number of falls 5, reports they were due to fumbling of her feet and one time was from the stairs into the garage; reports most falls occur in the backwards direction  LIVING ENVIRONMENT: Lives with: lives alone Lives in: House/apartment Stairs: 3 steps into the house, 3 on the sides, rails on both sets Has following equipment at home: Single point cane and rollator  PLOF: Independent  PATIENT GOALS: She would like to be able to walk without getting dizzy or fumbling her feet like she's drunk all the time.  OBJECTIVE:  Note: Objective measures were completed at Evaluation unless otherwise noted.  DIAGNOSTIC FINDINGS:  From 11/22/2023 CEREBRAL MRI WO Contrast IMPRESSION: 1. No acute intracranial abnormality. 2. Moderate chronic small vessel ischemic disease.  From Xray of Lumbar Spine on 08/30/2023: XRs of the lumbar spine from 08/30/2023 were independently reviewed and  interpreted, showing grade 2 spondylolisthesis at L5/S1. Disc height loss  at L5/S1. No other significant degenerative changes noted. No fracture or  dislocation seen.    COGNITION: Overall cognitive status: Within functional limits for tasks assessed   SENSATION:  Light touch: Impaired  R side:  decreased sensation on upper lateral thigh,complete lack of sensation on lateral ankle, foot, and toes L side: lateral calf decreased sensation, lateral border of feet decreased  COORDINATION: H<>S= WFL RAMPS: WFL  POSTURE: rounded shoulders, forward head, decreased lumbar lordosis,  increased thoracic kyphosis, and flexed trunk   LOWER EXTREMITY MMT:  MMT Right Eval Left Eval  Hip flexion 3 4  Hip extension    Hip abduction 4+ 4+  Hip adduction 4+ 4+  Hip internal rotation    Hip external rotation    Knee flexion 4 4+  Knee extension 4 4+  Ankle dorsiflexion 3+ 4+  Ankle plantarflexion    Ankle inversion    Ankle eversion    (Blank rows = not tested)   TRANSFERS: Sit to stand: Complete Independence  Assistive device utilized: None     Stand to sit: Complete Independence  Assistive device utilized: None       STAIRS: Not tested GAIT: Findings: Gait Characteristics: decreased step length- Right, decreased step length- Left, decreased stride length, lateral hip instability, decreased trunk rotation, trunk flexed, and narrow BOS, Distance walked: distance into and out of clinic. Distance taken to perform functional outcome measures, Assistive device utilized:Walker - 4 wheeled, Level of assistance: CGA and Min A, and Comments: Pt showed significant decrease in postural control during ambulation with shuffling and inversion/eversion of ankles for correction seen as well.  FUNCTIONAL TESTS:  -Pt performed 5 time sit<>stand (5xSTS): 9.80s with hands, 8.26s without hands (>15 sec indicates increased fall risk)    -10 Meter Walk Test: Patient instructed to walk 10 meters (32.8 ft) as quickly and as safely as possible at their normal speed x2 and at a fast speed x2. Time measured from 2 meter mark to 8 meter mark to accommodate ramp-up and ramp-down.  Normal speed 1 no AD: 13.14s  Normal speed 2 no AD: 11.91s  -Avg speed with no AD: 0.772m/s Normal speed 1 w/ rollator: 8.97s Normal speed 2 w/ rollator: 9.27s  -Avg speed w/ rollator: 1.065m/s Cut off scores: <0.4 m/s = household Ambulator, 0.4-0.8 m/s = limited community Ambulator, >0.8 m/s = community Ambulator, >1.2 m/s = crossing a street, <1.0 = increased fall risk MCID 0.05 m/s (small), 0.13 m/s  (moderate), 0.06 m/s (significant)  (ANPTA Core Set of Outcome Measures for Adults with Neurologic Conditions, 2018)   -PT instructed pt in TUG: 11.575s avg. Trial 1:12.08s, Trial 2: 11.07s (average of 3 trials; >13.5 sec indicates increased fall risk)  6 minute walk test: able to ambulate for around 3:41m before rest break due to R foot pain and B foot numbness  Berg Balance Scale:  Item Test date: 04/02/2024  Date:  Date:   Sitting to standing 4. able to stand without using hands and stabilize independently    2. Standing unsupported 1. needs several tries to stand 30 seconds unsupported    3. Sitting with back unsupported, feet supported 4. able to sit safely and securely for 2 minutes    4. Standing to sitting 4. sits safely with minimal use of hands    5. Pivot transfer  4. able to transfer safely with minor use of hands    6. Standing unsupported with eyes closed 0. needs help to keep from falling    7. Standing unsupported with feet together 2. able to place feet together independently but unable to hold for 30 seconds    8. Reaching forward with outstretched arms while standing 3. can reach forward 12 cm (5 inches)    9. Pick up object from the floor from standing 4. able to pick up slipper safely and easily    10. Turning to look behind over left and right shoulders while standing 2. turns sideways but only maintains balance    11. Turn 360 degrees 0. needs  assistance while turning    12. Place alternate foot on step or stool while standing unsupported 1. able to complete > 2 steps needs minimal assist    13. Standing unsupported one foot in front 0. loses balance while stepping or standing    14. Standing on one leg 0. unable to try of needs assist to prevent fall      Total Score 29/56 Total Score:    Total Score:    Patient demonstrates increased fall risk as noted by score of 29  /56 on Berg Balance Scale.  (<36= high risk for falls, close to 100%; 37-45 significant >80%; 46-51  moderate >50%; 52-55 lower >25%)  Activities-specific Balance Confidence Scale:  Score: 55.63% Increased risk of falls in community-dwelling, older adults <80% (79.89%)  0% = no confidence - 100% = complete confidence (ANPTA Core Set of Outcome Measures for Adults with Neurologic Conditions, 2018)   PATIENT SURVEYS: Pt ABC score 04/02/24: 55.63% VITALS:    Seated Vitals- BP: 116/58 HR: 76         Standing Vitals- BP:122/54  HR: 73                                                                                                                TREATMENT DATE: 04/02/2024  - performed this session, pt seated rest break taken at 3:40m due to R foot pain after performing 568ft ambulation -standing squats w/ B UE support on bar 3x10 -standing hip flexion 3x15, 1.5# AW B LE -standing toe/heel raises 3x15, 1.5# AW B LE -static stance on blue foam pad, no UE support on bar, 2x30s -static stance EC, 2x30s -split stance with forward foot on blue foam pad, 2x30s each side  PATIENT EDUCATION: Education details: Pt educated in performance of functional tests and measures. PT discussed with sister in law pt history of multiple TIAs being the source of pt gait and balance impairments. Person educated: Patient and Sister in social worker Education method: Explanation Education comprehension: verbalized understanding  HOME EXERCISE PROGRAM: Administer at follow up appointment**  GOALS: Goals reviewed with patient? No   SHORT TERM GOALS: Target date: 04/25/2024   Patient will be independent in home exercise program to improve strength/mobility for better functional independence with ADLs. Baseline:  Goal status: INITIAL   LONG TERM GOALS: Target date: 06/20/2024   Patient will increase ABC scale score >80% to demonstrate better functional mobility and better confidence with ADLs.  Baseline: 55.63% Goal status: INITIAL   2.  Patient will increase Berg Balance score by > 6 points to demonstrate  decreased fall risk during functional activities Baseline: 29 Goal status: INITIAL  3.  Patient will increase 10 meter walk test without using AD to >1.55m/s as to improve gait speed for better community ambulation and to reduce fall risk. Baseline: 0.784m/s Goal status: INITIAL  4.  Patient will reduce timed up and go to <11 seconds to reduce fall risk and demonstrate improved transfer/gait ability. Baseline: 11.575s Goal status: INITIAL   5. Pt will  increase R LE MMT scores by 1 point in order to show improvement of strength for increased ability to perform functional activities and for stability during ambulation for fall prevention.  Baseline:  Goal status: INITIAL  ASSESSMENT:  CLINICAL IMPRESSION: Pt performed and answered ABC questionnaire at start of session. Pt ABC score of 55.63% shows increased risk for falls. not fully completed today due to pt reported LE/foot numbness and pain after 3.22s of ambulation in hallway, requiring a seated rest break until numbness subsided around 4 min later. Pt showed difficulty with postural control with blue foam pad and eyes closed balance activities. Pt required min assist during eyes closed activities, and CGA-min assist during blue foam pad stance activities. Pt instructed in and showed good understanding of HEP exercises in today's session. Cues needed for proper form of squat for backwards hip motion and wider BoS during all standing activities. Next session would benefit from performing ambulation with head turns and pivots. Pt will continue to benefit from skilled therapy to address remaining deficits in order to improve overall QoL and return to PLOF.    OBJECTIVE IMPAIRMENTS: Abnormal gait, decreased activity tolerance, decreased balance, decreased endurance, decreased knowledge of condition, decreased knowledge of use of DME, decreased mobility, difficulty walking, decreased strength, decreased safety awareness, dizziness,  impaired sensation, impaired vision/preception, improper body mechanics, and postural dysfunction.   ACTIVITY LIMITATIONS: carrying, lifting, bending, standing, reach over head, and locomotion level  PARTICIPATION LIMITATIONS: community activity and yard work  PERSONAL FACTORS: Age, Past/current experiences, Time since onset of injury/illness/exacerbation, and 3+ comorbidities: CAD, CKD, unstable angina are also affecting patient's functional outcome.   REHAB POTENTIAL: Good  CLINICAL DECISION MAKING: Evolving/moderate complexity  EVALUATION COMPLEXITY: Moderate  PLAN:  PT FREQUENCY: 1-2x/week  PT DURATION: 12 weeks  PLANNED INTERVENTIONS: 97164- PT Re-evaluation, 97750- Physical Performance Testing, 97110-Therapeutic exercises, 97530- Therapeutic activity, 97112- Neuromuscular re-education, 97535- Self Care, [id number]- Manual therapy, [phone]- Gait training, [phone]- Orthotic Initial, [phone]- Orthotic/Prosthetic subsequent, [phone]- Aquatic Therapy, [phone]- Electrical stimulation (unattended), [phone] (1-2 muscles), [phone] (3+ muscles)- Dry Needling, Patient/Family education, Balance training, Stair training, Joint mobilization, Joint manipulation, Spinal manipulation, Spinal mobilization, Vestibular training, DME instructions, Wheelchair mobility training, Cryotherapy, and Moist heat  PLAN FOR NEXT SESSION:  -continue dynamic balance training, EO/EC, varying BoS and surface -look at ambulation/balance during head turning -review HEP and add balance activities    Renna Helling, SPT 04/02/2024, 3:19 PM

## 2024-04-09 ENCOUNTER — Ambulatory Visit

## 2024-04-10 ENCOUNTER — Encounter: Payer: Self-pay | Admitting: Physical Therapy

## 2024-04-10 ENCOUNTER — Ambulatory Visit: Attending: Neurology | Admitting: Physical Therapy

## 2024-04-10 DIAGNOSIS — R296 Repeated falls: Secondary | ICD-10-CM | POA: Diagnosis present

## 2024-04-10 DIAGNOSIS — R2689 Other abnormalities of gait and mobility: Secondary | ICD-10-CM | POA: Insufficient documentation

## 2024-04-10 DIAGNOSIS — R262 Difficulty in walking, not elsewhere classified: Secondary | ICD-10-CM | POA: Diagnosis present

## 2024-04-10 DIAGNOSIS — M6281 Muscle weakness (generalized): Secondary | ICD-10-CM | POA: Insufficient documentation

## 2024-04-10 NOTE — Therapy (Unsigned)
 OUTPATIENT PHYSICAL THERAPY TREATMENT   Patient Name: [name] MRN: [id number] DOB:Jan 08, 1946, 78 y.o., female Today's Date: 04/10/2024   PCP: Sadie Manna, MD  REFERRING PROVIDER: Maree Jannett MARLA, MD  END OF SESSION:  PT End of Session - 04/10/24 1313     Visit Number 3    Number of Visits 24    Date for Recertification  06/20/24    PT Start Time 1315    PT Stop Time 1400    PT Time Calculation (min) 45 min    Equipment Utilized During Treatment Gait belt    Activity Tolerance Patient tolerated treatment well    Behavior During Therapy WFL for tasks assessed/performed          Past Medical History:  Diagnosis Date   Acid reflux    Anemia    CAD (coronary artery disease)    Chronic constipation    Chronic kidney disease    stage 3 chronic kidney disease   Eczema    H/O heart artery stent    Hyperlipidemia    Hypertension    IBS (irritable bowel syndrome) 2004   Migraine    Mitral valve prolapse    Peptic ulcer 1989   Skin cancer    Past Surgical History:  Procedure Laterality Date   ABDOMINAL HYSTERECTOMY     APPENDECTOMY     BREAST BIOPSY Left 07/14/2021   left stereo bx ribbon clip path stromal fibrosis   CARDIAC CATHETERIZATION N/A 10/21/2015   Procedure: Left Heart Cath and Coronary Angiography;  Surgeon: Marsa Dooms, MD;  Location: [facility];  Service: Cardiovascular;  Laterality: N/A;   CARDIAC CATHETERIZATION N/A 10/21/2015   Procedure: Coronary Stent Intervention;  Surgeon: Marsa Dooms, MD;  Location: [facility];  Service: Cardiovascular;  Laterality: N/A;   CARDIAC CATHETERIZATION Left 11/24/2015   Procedure: Coronary Stent Intervention;  Surgeon: Marsa Dooms, MD;  Location: [facility];  Service: Cardiovascular;  Laterality: Left;   CARDIAC SURGERY  2007   cardiac stent place   CATARACT EXTRACTION  2012   CHOLECYSTECTOMY     CORONARY ANGIOPLASTY WITH STENT PLACEMENT  2013    CORONARY ARTERY BYPASS GRAFT  2008   CORONARY STENT INTERVENTION N/A 07/04/2017   Procedure: CORONARY STENT INTERVENTION;  Surgeon: Dooms Marsa, MD;  Location: [facility];  Service: Cardiovascular;  Laterality: N/A;   CORONARY STENT INTERVENTION N/A 08/08/2017   Procedure: CORONARY STENT INTERVENTION;  Surgeon: Dooms Marsa, MD;  Location: [facility];  Service: Cardiovascular;  Laterality: N/A;   CYSTOSCOPY W/ RETROGRADES Bilateral 12/29/2014   Procedure: CYSTOSCOPY WITH RETROGRADE PYELOGRAM;  Surgeon: Charlie JONETTA Pack, MD;  Location: [facility];  Service: Urology;  Laterality: Bilateral;   CYSTOSCOPY WITH BIOPSY N/A 12/29/2014   Procedure: CYSTOSCOPY WITH BIOPSY;  Surgeon: Charlie JONETTA Pack, MD;  Location: [facility];  Service: Urology;  Laterality: N/A;   HAND SURGERY  1998   LEFT HEART CATH AND CORONARY ANGIOGRAPHY Left 08/08/2017   Procedure: LEFT HEART CATH AND CORONARY ANGIOGRAPHY;  Surgeon: Dooms Marsa, MD;  Location: [facility];  Service: Cardiovascular;  Laterality: Left;   LEFT HEART CATH AND CORONARY ANGIOGRAPHY Left 08/01/2019   Procedure: LEFT HEART CATH AND CORONARY ANGIOGRAPHY;  Surgeon: Dooms Marsa, MD;  Location: [facility];  Service: Cardiovascular;  Laterality: Left;   LEFT HEART CATH AND CORS/GRAFTS ANGIOGRAPHY N/A 07/04/2017   Procedure: LEFT HEART CATH AND CORS/GRAFTS ANGIOGRAPHY;  Surgeon: Dooms Marsa, MD;  Location: [facility];  Service: Cardiovascular;  Laterality: N/A;   SIGMOID RESECTION / RECTOPEXY  2006   SKIN CANCER EXCISION  2013   TENDON REPAIR     right elbow   TRIGGER FINGER RELEASE     Patient Active Problem List   Diagnosis Date Noted   Age-related osteoporosis without current pathological fracture 09/20/2023   Chronic midline low back pain with bilateral sciatica 05/24/2023   Spondylolisthesis at L5-S1 level 05/24/2023   Right leg pain 05/24/2023   Right leg weakness  05/24/2023   Anemia of chronic renal failure, stage 4 (severe) (HCC) 11/02/2020   Hypokalemia 12/09/2019   Carotid artery stenosis 07/05/2019   Palpitations 06/24/2019   Dyspnea on exertion 06/24/2019   Hyperparathyroidism due to renal insufficiency 03/10/2019   Renal artery stenosis 03/10/2019   Cardiac syncope 02/16/2016   Unstable angina (HCC) 11/24/2015   CAD (coronary artery disease) 10/21/2015   S/P cardiac catheterization 10/21/2015   Iron  deficiency anemia 09/14/2015   Chest pain 08/20/2015   Hematuria 12/17/2014   Allergic condition 11/04/2014   Mitral valve prolapse 11/04/2014   Peptic ulcer 11/04/2014   Gross hematuria 11/04/2014   Atrophic vaginitis 11/04/2014   Hyperlipidemia 12/11/2013   Intolerance of drug 12/11/2013   Regurgitation 12/11/2013   Coronary atherosclerosis of autologous vein bypass graft 12/11/2013   Hypertension 12/11/2013   Presence of coronary angioplasty implant and graft 12/11/2013   Statin intolerance 12/11/2013   Other specified health status 12/11/2013   Adverse effect of unspecified drugs, medicaments and biological substances, initial encounter 12/11/2013   Anemia 09/16/2013   Chronic urinary tract infection 09/16/2013   Irritable bowel syndrome (IBS) 09/16/2013   CKD (chronic kidney disease) stage 4, GFR 15-29 ml/min (HCC) 09/16/2013   Echocardiogram abnormal 08/02/2007   Arteriosclerosis of coronary artery 09/28/2005   Atherosclerotic heart disease of native coronary artery without angina pectoris 09/28/2005   Gastro-esophageal reflux disease without esophagitis 06/30/2003    ONSET DATE: since July 2025 balance has started decreasing  REFERRING DIAG: R26.89 (ICD-10-CM) - Other abnormalities of gait and mobility  THERAPY DIAG:  Muscle weakness (generalized)  Frequent falls  Impaired ambulation  Abnormality of gait due to impairment of balance  Rationale for Evaluation and Treatment: Rehabilitation  SUBJECTIVE:                                                                                                                                                                                              SUBJECTIVE STATEMENT:  04/10/2024: Reported a fall at her brother's house on Thanksgiving day where she completely blacked out and fell, causing bruises  on B LE. Pt reports this is the first time it has occurred. Also reported a near fainting spell this past Saturday and had to sit down due to a drop in BP.  From Eval:Pt reports feeling okay today. Pt reports blurry vision consistently from a film over eyes from past eye surgery causing blurriness. Pt reports towards the end of July 2025 she began having difficulty with speech and getting words out. Pt reports sensory loss and tingling in the R and L LE, more so in the R. Reports it is the most severe in the R and L lateral border of her feet, and up the lateral sides of her calf, and to her knees. Pt reports most of her falls occur in the backwards direction. Reports she has had regular headaches everyday for a very long time. Reports she feels like she's floating when she gets up to walk when asked to describe her dizziness. Pt states she uses a SPC and a rollator with a seat while ambulating around her home and in the community. Seated Vitals- BP:116/58 HR: 76  Standing Vitals- BP: 122/54  HR 73  PERTINENT HISTORY:  From AMB refferral 03/05/24: History and Present Illness: Patient states she has been having issues with her speech, gait, and dizziness since around July 2025. Symptoms came on gradually. Speech has gotten better but dizziness and gait have gotten worse. She has some associated headaches. No associated tinnitus or leg weakness. MRI was negative for stroke. Sensation of 'staggering' when walking. No orthostasis. Assessment and Plan: Gait disturbance and imbalance with dizziness due to silent stroke and moderate microvascular ischemic brain changes. She is at higher  risk for falls and further strokes. Lack of movement exacerbates balance issues. Encourage use of walker with a seat to prevent falls and promote mobility. Advise reducing prolonged sitting and screen time to improve balance and prevent further deterioration. 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  PAIN:  Are you having pain? No R foot pain, numbness in both feet  PRECAUTIONS: None  RED FLAGS: None   WEIGHT BEARING RESTRICTIONS: No  FALLS: Has patient fallen in last 6 months? Yes. Number of falls 5, reports they were due to fumbling of her feet and one time was from the stairs into the garage; reports most falls occur in the backwards direction  LIVING ENVIRONMENT: Lives with: lives alone Lives in: House/apartment Stairs: 3 steps into the house, 3 on the sides, rails on both sets Has following equipment at home: Single point cane and rollator  PLOF: Independent  PATIENT GOALS: She would like to be able to walk without getting dizzy or fumbling her feet like she's drunk all the time.  OBJECTIVE:  Note: Objective measures were completed at Evaluation unless otherwise noted.  DIAGNOSTIC FINDINGS:  From 11/22/2023 CEREBRAL MRI WO Contrast IMPRESSION: 1. No acute intracranial abnormality. 2. Moderate chronic small vessel ischemic disease.  From Xray of Lumbar Spine on 08/30/2023: XRs of the lumbar spine from 08/30/2023 were independently reviewed and  interpreted, showing grade 2 spondylolisthesis at L5/S1. Disc height loss  at L5/S1. No other significant degenerative changes noted. No fracture or  dislocation seen.    COGNITION: Overall cognitive status: Within functional limits for tasks assessed   SENSATION:  Light touch: Impaired  R side:  decreased sensation on upper lateral thigh,complete lack of sensation on lateral ankle, foot, and toes L side: lateral calf decreased sensation, lateral border of feet  decreased  COORDINATION: H<>S= [facility]  RAMPS: WFL  POSTURE: rounded shoulders, forward head, decreased lumbar lordosis, increased thoracic kyphosis, and flexed trunk   LOWER EXTREMITY MMT:    MMT Right Eval Left Eval  Hip flexion 3 4  Hip extension    Hip abduction 4+ 4+  Hip adduction 4+ 4+  Hip internal rotation    Hip external rotation    Knee flexion 4 4+  Knee extension 4 4+  Ankle dorsiflexion 3+ 4+  Ankle plantarflexion    Ankle inversion    Ankle eversion    (Blank rows = not tested)   TRANSFERS: Sit to stand: Complete Independence  Assistive device utilized: None     Stand to sit: Complete Independence  Assistive device utilized: None       STAIRS: Not tested GAIT: Findings: Gait Characteristics: decreased step length- Right, decreased step length- Left, decreased stride length, lateral hip instability, decreased trunk rotation, trunk flexed, and narrow BOS, Distance walked: distance into and out of clinic. Distance taken to perform functional outcome measures, Assistive device utilized:Walker - 4 wheeled, Level of assistance: CGA and Min A, and Comments: Pt showed significant decrease in postural control during ambulation with shuffling and inversion/eversion of ankles for correction seen as well.  FUNCTIONAL TESTS:  -Pt performed 5 time sit<>stand (5xSTS): 9.80s with hands, 8.26s without hands (>15 sec indicates increased fall risk)    -10 Meter Walk Test: Patient instructed to walk 10 meters (32.8 ft) as quickly and as safely as possible at their normal speed x2 and at a fast speed x2. Time measured from 2 meter mark to 8 meter mark to accommodate ramp-up and ramp-down.  Normal speed 1 no AD: 13.14s  Normal speed 2 no AD: 11.91s  -Avg speed with no AD: 0.738m/s Normal speed 1 w/ rollator: 8.97s Normal speed 2 w/ rollator: 9.27s  -Avg speed w/ rollator: 1.011m/s Cut off scores: <0.4 m/s = household Ambulator, 0.4-0.8 m/s = limited community Ambulator, >0.8 m/s  = community Ambulator, >1.2 m/s = crossing a street, <1.0 = increased fall risk MCID 0.05 m/s (small), 0.13 m/s (moderate), 0.06 m/s (significant)  (ANPTA Core Set of Outcome Measures for Adults with Neurologic Conditions, 2018)   -PT instructed pt in TUG: 11.575s avg. Trial 1:12.08s, Trial 2: 11.07s (average of 3 trials; >13.5 sec indicates increased fall risk)  6 minute walk test: able to ambulate for around 3:29m before rest break due to R foot pain and B foot numbness  Berg Balance Scale:  Item Test date: 04/10/2024  Date:  Date:   Sitting to standing 4. able to stand without using hands and stabilize independently    2. Standing unsupported 1. needs several tries to stand 30 seconds unsupported    3. Sitting with back unsupported, feet supported 4. able to sit safely and securely for 2 minutes    4. Standing to sitting 4. sits safely with minimal use of hands    5. Pivot transfer  4. able to transfer safely with minor use of hands    6. Standing unsupported with eyes closed 0. needs help to keep from falling    7. Standing unsupported with feet together 2. able to place feet together independently but unable to hold for 30 seconds    8. Reaching forward with outstretched arms while standing 3. can reach forward 12 cm (5 inches)    9. Pick up object from the floor from standing 4. able to pick up slipper safely and easily    10. Turning to  look behind over left and right shoulders while standing 2. turns sideways but only maintains balance    11. Turn 360 degrees 0. needs assistance while turning    12. Place alternate foot on step or stool while standing unsupported 1. able to complete > 2 steps needs minimal assist    13. Standing unsupported one foot in front 0. loses balance while stepping or standing    14. Standing on one leg 0. unable to try of needs assist to prevent fall      Total Score 29/56 Total Score:    Total Score:    Patient demonstrates increased fall risk as noted by  score of 29  /56 on Berg Balance Scale.  (<36= high risk for falls, close to 100%; 37-45 significant >80%; 46-51 moderate >50%; 52-55 lower >25%)  Activities-specific Balance Confidence Scale:  Score: 55.63% Increased risk of falls in community-dwelling, older adults <80% (79.89%)  0% = no confidence - 100% = complete confidence (ANPTA Core Set of Outcome Measures for Adults with Neurologic Conditions, 2018)   PATIENT SURVEYS: Pt ABC score 04/02/24: 55.63% VITALS:    Seated Vitals- BP: 116/58 HR: 76         Standing Vitals- BP:122/54  HR: 73                                                                                                                TREATMENT DATE: 04/10/2024 Vitals  Prior to session: Seated: BP 104/57, HR 76 Standing: BP 100/50, HR 78   End of session: Seated: BP 128/52, HR 76  -laps down hallway w/ horizontal head turns, 2 laps  -laps down hallway w/ vertical head turns, 1 lap  -seated LAQ w/ 2.5# AW on B LE, 2x20  -standing hip flexion 2x20, 2.5# AW on B LE  -standing hip abduction w/ 2.5# AW on B LE  -static stance on blue foam pad, no UE support, 2x30s  -static stance on blue foam pad eyes closed, 2x30s, L UE support  -split stance balance with forward foot on 4in step, 2x30s each side  *Pt required CGA-min assist during all activities in today's session unless otherwise stated.*  PATIENT EDUCATION: Education details: Pt educated in performance of functional tests and measures. PT discussed with sister in law pt history of multiple TIAs being the source of pt gait and balance impairments. Person educated: Patient and Sister in social worker Education method: Explanation Education comprehension: verbalized understanding  HOME EXERCISE PROGRAM: Access Code: [email or web address] URL: [email or web address] Date: 04/03/2024 Prepared by: Massie Dollar  Exercises - Standing March with Counter Support - 1 x daily - 7 x weekly - 3 sets - 10 reps - Mini Squat  with Counter Support - 1 x daily - 7 x weekly - 3 sets - 10 reps - Heel Toe Raises with Counter Support - 1 x daily - 7 x weekly - 3 sets - 10 reps - 3 second each direction hold   GOALS: Goals reviewed with patient? No   SHORT TERM GOALS:  Target date: 04/25/2024   Patient will be independent in home exercise program to improve strength/mobility for better functional independence with ADLs. Baseline:  Goal status: INITIAL   LONG TERM GOALS: Target date: 06/20/2024   Patient will increase ABC scale score >80% to demonstrate better functional mobility and better confidence with ADLs.  Baseline: 55.63% Goal status: INITIAL   2.  Patient will increase Berg Balance score by > 6 points to demonstrate decreased fall risk during functional activities Baseline: 29 Goal status: INITIAL  3.  Patient will increase 10 meter walk test without using AD to >1.21m/s as to improve gait speed for better community ambulation and to reduce fall risk. Baseline: 0.73m/s Goal status: INITIAL  4.  Patient will reduce timed up and go to <11 seconds to reduce fall risk and demonstrate improved transfer/gait ability. Baseline: 11.575s Goal status: INITIAL   5. Pt will increase R LE MMT scores by 1 point in order to show improvement of strength for increased ability to perform functional activities and for stability during ambulation for fall prevention.  Baseline:  Goal status: INITIAL  ASSESSMENT:  CLINICAL IMPRESSION: Began session with vitals assessment due to pt reported fall on Thanksgiving and near fainting spell occurrences since last appointment. Pt educated on contacting MD regarding black out spell on Thanksgiving, due to it being the first time it had happened. Pt with continued complaint of B foot numbness, pain, and  R foot swelling. Pt showed decreased postural control during dynamic gait in hallway with head turning, showing no LoB, but L veering and decreased speed of gait during head  turns, with slight crossover of feet during changes. Pt reported lightheadedness following first 2 laps in hallway, taking about a minute to return to baseline. Pt required 3 minutes seated break due to foot numbness following hallway ambulation. Pt tolerated all LE strengthening exercises well, showing good endurance and form, with increased weight of 2# AW. Pt continued to show difficulty with EC and split stance balance activities, reporting feeling wobbly and needing min assist to correct L and anterior postural sway. Pt will continue to benefit from skilled therapy to address remaining deficits in order to improve overall QoL and return to PLOF.    OBJECTIVE IMPAIRMENTS: Abnormal gait, decreased activity tolerance, decreased balance, decreased endurance, decreased knowledge of condition, decreased knowledge of use of DME, decreased mobility, difficulty walking, decreased strength, decreased safety awareness, dizziness, impaired sensation, impaired vision/preception, improper body mechanics, and postural dysfunction.   ACTIVITY LIMITATIONS: carrying, lifting, bending, standing, reach over head, and locomotion level  PARTICIPATION LIMITATIONS: community activity and yard work  PERSONAL FACTORS: Age, Past/current experiences, Time since onset of injury/illness/exacerbation, and 3+ comorbidities: CAD, CKD, unstable angina are also affecting patient's functional outcome.   REHAB POTENTIAL: Good  CLINICAL DECISION MAKING: Evolving/moderate complexity  EVALUATION COMPLEXITY: Moderate  PLAN:  PT FREQUENCY: 1-2x/week  PT DURATION: 12 weeks  PLANNED INTERVENTIONS: 97164- PT Re-evaluation, 97750- Physical Performance Testing, 97110-Therapeutic exercises, 97530- Therapeutic activity, [id number]- Neuromuscular re-education, 97535- Self Care, [id number]- Manual therapy, [phone]- Gait training, [phone]- Orthotic Initial, [phone]- Orthotic/Prosthetic subsequent, [phone]- Aquatic Therapy, [phone]- Electrical stimulation  (unattended), [phone] (1-2 muscles), [phone] (3+ muscles)- Dry Needling, Patient/Family education, Balance training, Stair training, Joint mobilization, Joint manipulation, Spinal manipulation, Spinal mobilization, Vestibular training, DME instructions, Wheelchair mobility training, Cryotherapy, and Moist heat  PLAN FOR NEXT SESSION:  -follow up with pt about contacting MD due to fainting spells -continue dynamic balance training, EO/EC, varying BoS and surface -look at ambulation/balance during  head turning -review HEP and add balance activities    Renna Helling, SPT 04/10/2024, 1:15 PM

## 2024-04-11 ENCOUNTER — Observation Stay
Admission: EM | Admit: 2024-04-11 | Discharge: 2024-04-12 | Disposition: A | Attending: Internal Medicine | Admitting: Internal Medicine

## 2024-04-11 ENCOUNTER — Emergency Department

## 2024-04-11 ENCOUNTER — Other Ambulatory Visit: Payer: Self-pay

## 2024-04-11 ENCOUNTER — Ambulatory Visit

## 2024-04-11 DIAGNOSIS — Z7982 Long term (current) use of aspirin: Secondary | ICD-10-CM | POA: Insufficient documentation

## 2024-04-11 DIAGNOSIS — N184 Chronic kidney disease, stage 4 (severe): Secondary | ICD-10-CM | POA: Diagnosis present

## 2024-04-11 DIAGNOSIS — F39 Unspecified mood [affective] disorder: Secondary | ICD-10-CM | POA: Insufficient documentation

## 2024-04-11 DIAGNOSIS — I129 Hypertensive chronic kidney disease with stage 1 through stage 4 chronic kidney disease, or unspecified chronic kidney disease: Secondary | ICD-10-CM | POA: Insufficient documentation

## 2024-04-11 DIAGNOSIS — I2510 Atherosclerotic heart disease of native coronary artery without angina pectoris: Secondary | ICD-10-CM | POA: Diagnosis present

## 2024-04-11 DIAGNOSIS — E785 Hyperlipidemia, unspecified: Secondary | ICD-10-CM | POA: Diagnosis present

## 2024-04-11 DIAGNOSIS — I25810 Atherosclerosis of coronary artery bypass graft(s) without angina pectoris: Secondary | ICD-10-CM | POA: Diagnosis present

## 2024-04-11 DIAGNOSIS — F411 Generalized anxiety disorder: Secondary | ICD-10-CM | POA: Insufficient documentation

## 2024-04-11 DIAGNOSIS — J449 Chronic obstructive pulmonary disease, unspecified: Secondary | ICD-10-CM | POA: Insufficient documentation

## 2024-04-11 DIAGNOSIS — Z79899 Other long term (current) drug therapy: Secondary | ICD-10-CM | POA: Insufficient documentation

## 2024-04-11 DIAGNOSIS — R600 Localized edema: Secondary | ICD-10-CM | POA: Insufficient documentation

## 2024-04-11 DIAGNOSIS — K219 Gastro-esophageal reflux disease without esophagitis: Secondary | ICD-10-CM | POA: Diagnosis present

## 2024-04-11 DIAGNOSIS — R55 Syncope and collapse: Principal | ICD-10-CM | POA: Diagnosis present

## 2024-04-11 DIAGNOSIS — I10 Essential (primary) hypertension: Secondary | ICD-10-CM | POA: Diagnosis present

## 2024-04-11 DIAGNOSIS — N390 Urinary tract infection, site not specified: Secondary | ICD-10-CM

## 2024-04-11 LAB — CBC
HCT: 32.8 % — ABNORMAL LOW (ref 36.0–46.0)
Hemoglobin: 9.9 g/dL — ABNORMAL LOW (ref 12.0–15.0)
MCH: 31.5 pg (ref 26.0–34.0)
MCHC: 30.2 g/dL (ref 30.0–36.0)
MCV: 104.5 fL — ABNORMAL HIGH (ref 80.0–100.0)
Platelets: 318 K/uL (ref 150–400)
RBC: 3.14 MIL/uL — ABNORMAL LOW (ref 3.87–5.11)
RDW: 15.7 % — ABNORMAL HIGH (ref 11.5–15.5)
WBC: 7.5 K/uL (ref 4.0–10.5)
nRBC: 0 % (ref 0.0–0.2)

## 2024-04-11 LAB — URINALYSIS, ROUTINE W REFLEX MICROSCOPIC
Bilirubin Urine: NEGATIVE
Glucose, UA: NEGATIVE mg/dL
Hgb urine dipstick: NEGATIVE
Ketones, ur: NEGATIVE mg/dL
Nitrite: NEGATIVE
Protein, ur: NEGATIVE mg/dL
Specific Gravity, Urine: 1.021 (ref 1.005–1.030)
WBC, UA: >50 WBC/hpf (ref 0–5)
pH: 5 (ref 5.0–8.0)

## 2024-04-11 LAB — COMPREHENSIVE METABOLIC PANEL WITH GFR
ALT: 17 U/L (ref 0–44)
AST: 18 U/L (ref 15–41)
Albumin: 3.8 g/dL (ref 3.5–5.0)
Alkaline Phosphatase: 39 U/L (ref 38–126)
Anion gap: 13 (ref 5–15)
BUN: 47 mg/dL — ABNORMAL HIGH (ref 8–23)
CO2: 20 mmol/L — ABNORMAL LOW (ref 22–32)
Calcium: 9.3 mg/dL (ref 8.9–10.3)
Chloride: 108 mmol/L (ref 98–111)
Creatinine, Ser: 3.57 mg/dL — ABNORMAL HIGH (ref 0.44–1.00)
GFR, Estimated: 13 mL/min — ABNORMAL LOW (ref >60)
Glucose, Bld: 124 mg/dL — ABNORMAL HIGH (ref 70–99)
Potassium: 4.5 mmol/L (ref 3.5–5.1)
Sodium: 141 mmol/L (ref 135–145)
Total Bilirubin: <0.2 mg/dL (ref 0.0–1.2)
Total Protein: 5.9 g/dL — ABNORMAL LOW (ref 6.5–8.1)

## 2024-04-11 LAB — TROPONIN T, HIGH SENSITIVITY: Troponin T High Sensitivity: 45 ng/L — ABNORMAL HIGH (ref 0–19)

## 2024-04-11 LAB — GLUCOSE, CAPILLARY: Glucose-Capillary: 69 mg/dL — ABNORMAL LOW (ref 70–99)

## 2024-04-11 LAB — TSH: TSH: 2.2 u[IU]/mL (ref 0.350–4.500)

## 2024-04-11 LAB — MAGNESIUM: Magnesium: 2 mg/dL (ref 1.7–2.4)

## 2024-04-11 MED ORDER — MELATONIN 5 MG PO TABS
5.0000 mg | ORAL_TABLET | Freq: Every evening | ORAL | Status: DC | PRN
  Administered 2024-04-11: 5 mg via ORAL
  Filled 2024-04-11: qty 1

## 2024-04-11 MED ORDER — SENNOSIDES-DOCUSATE SODIUM 8.6-50 MG PO TABS: 1.0000 | ORAL_TABLET | Freq: Every evening | ORAL | Status: DC | PRN

## 2024-04-11 MED ORDER — HEPARIN SODIUM (PORCINE) 5000 UNIT/ML IJ SOLN
5000.0000 [IU] | Freq: Three times a day (TID) | INTRAMUSCULAR | Status: DC
  Administered 2024-04-11 – 2024-04-12 (×3): 5000 [IU] via SUBCUTANEOUS
  Filled 2024-04-11 (×3): qty 1

## 2024-04-11 MED ORDER — SODIUM CHLORIDE 0.9% FLUSH
3.0000 mL | Freq: Two times a day (BID) | INTRAVENOUS | Status: DC
  Administered 2024-04-11 – 2024-04-12 (×3): 3 mL via INTRAVENOUS

## 2024-04-11 MED ORDER — ACETAMINOPHEN 325 MG PO TABS
650.0000 mg | ORAL_TABLET | Freq: Four times a day (QID) | ORAL | Status: DC | PRN
  Administered 2024-04-12: 650 mg via ORAL
  Filled 2024-04-11: qty 2

## 2024-04-11 MED ORDER — LEVOFLOXACIN IN D5W 500 MG/100ML IV SOLN
500.0000 mg | Freq: Once | INTRAVENOUS | Status: AC
  Administered 2024-04-11: 500 mg via INTRAVENOUS
  Filled 2024-04-11: qty 100

## 2024-04-11 MED ORDER — NITROGLYCERIN 0.4 MG SL SUBL
0.4000 mg | SUBLINGUAL_TABLET | SUBLINGUAL | Status: DC | PRN
  Administered 2024-04-11 (×2): 0.4 mg via SUBLINGUAL
  Filled 2024-04-11 (×2): qty 1

## 2024-04-11 MED ORDER — ACETAMINOPHEN 650 MG RE SUPP: 650.0000 mg | Freq: Four times a day (QID) | RECTAL | Status: DC | PRN

## 2024-04-11 MED ORDER — LACTATED RINGERS IV BOLUS
500.0000 mL | Freq: Once | INTRAVENOUS | Status: AC
  Administered 2024-04-11: 500 mL via INTRAVENOUS

## 2024-04-11 MED ORDER — LEVOFLOXACIN IN D5W 750 MG/150ML IV SOLN: 750.0000 mg | Freq: Once | INTRAVENOUS | Status: DC

## 2024-04-11 NOTE — H&P (Signed)
 History and Physical    [name] FMW:[id number] DOB: 1945/12/13 DOA: 04/11/2024  DOS: the patient was seen and examined on 04/11/2024  PCP: Ammon Blunt, MD   Patient coming from: Home  I have personally briefly reviewed patient's old medical records in [facility] and CareEverywhere  HPI:   [name] is a 78 y.o. year old female with medical history of hypertension, hyperlipidemia complicated by CAD status post CABG, GERD presenting to the ED after having multiple syncopal episodes over the last 2 weeks.  She discussed this with her neurology office and they recommended she present to the ED.   Pt states   On arrival to the ED patient was noted to be HDS stable.  Lab work and imaging obtained.  CBC with mild anemia close to her baseline that is macrocytic.  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  UA with bacteriuria and large leukocytes concerning for infection and urine culture ordered.  Troponin ordered and pending collection.  CT head and cervical spine without any acute findings.  Given patient's concern of multiple syncopal episodes, TRH contacted for admission.  Review of Systems: As mentioned in the history of present illness. All other systems reviewed and are negative.   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   CARDIAC CATHETERIZATION N/A 10/21/2015   Procedure: Left  Heart Cath and Coronary Angiography;  Surgeon: Blunt Ammon, MD;  Location: [facility];  Service: Cardiovascular;  Laterality: N/A;   CARDIAC CATHETERIZATION N/A 10/21/2015   Procedure: Coronary Stent Intervention;  Surgeon: Blunt Ammon, MD;  Location: [facility];  Service: Cardiovascular;  Laterality: N/A;   CARDIAC CATHETERIZATION Left 11/24/2015   Procedure: Coronary Stent Intervention;  Surgeon: Blunt Ammon, MD;  Location: [facility];  Service: Cardiovascular;  Laterality: Left;   CARDIAC SURGERY  2007   cardiac stent place   CATARACT EXTRACTION  2012   CHOLECYSTECTOMY     CORONARY ANGIOPLASTY WITH STENT PLACEMENT  2013   CORONARY ARTERY BYPASS GRAFT  2008   CORONARY STENT INTERVENTION N/A 07/04/2017   Procedure: CORONARY STENT INTERVENTION;  Surgeon: Ammon Blunt, MD;  Location: [facility];  Service: Cardiovascular;  Laterality: N/A;   CORONARY STENT INTERVENTION N/A 08/08/2017   Procedure: CORONARY STENT INTERVENTION;  Surgeon: Ammon Blunt, MD;  Location: [facility];  Service: Cardiovascular;  Laterality: N/A;   CYSTOSCOPY W/ RETROGRADES Bilateral 12/29/2014   Procedure: CYSTOSCOPY WITH RETROGRADE PYELOGRAM;  Surgeon: Charlie JONETTA Pack, MD;  Location: [facility];  Service: Urology;  Laterality: Bilateral;   CYSTOSCOPY WITH BIOPSY N/A 12/29/2014   Procedure: CYSTOSCOPY WITH BIOPSY;  Surgeon: Charlie JONETTA Pack, MD;  Location: [facility];  Service: Urology;  Laterality: N/A;  HAND SURGERY  1998   LEFT HEART CATH AND CORONARY ANGIOGRAPHY Left 08/08/2017   Procedure: LEFT HEART CATH AND CORONARY ANGIOGRAPHY;  Surgeon: Ammon Blunt, MD;  Location: [facility];  Service: Cardiovascular;  Laterality: Left;   LEFT HEART CATH AND CORONARY ANGIOGRAPHY Left 08/01/2019   Procedure: LEFT HEART CATH AND CORONARY ANGIOGRAPHY;  Surgeon: Ammon Blunt, MD;  Location: [facility];  Service:  Cardiovascular;  Laterality: Left;   LEFT HEART CATH AND CORS/GRAFTS ANGIOGRAPHY N/A 07/04/2017   Procedure: LEFT HEART CATH AND CORS/GRAFTS ANGIOGRAPHY;  Surgeon: Ammon Blunt, MD;  Location: [facility];  Service: Cardiovascular;  Laterality: N/A;   SIGMOID RESECTION / RECTOPEXY  2006   SKIN CANCER EXCISION  2013   TENDON REPAIR     right elbow   TRIGGER FINGER RELEASE       Allergies  Allergen Reactions   Prochlorperazine Nausea And Vomiting, Other (See Comments) and Nausea Only    Severe vomiting   Ezetimibe Other (See Comments)    Muscle Pain   Statins Other (See Comments)    Muscle Pain   Tape Other (See Comments)    Pulls my skin off when the tape is removed. Use paper tape only.   Cephalexin Rash   Metoclopramide Rash and Other (See Comments)    Elevates BP, face draws to one side   Penicillins Rash and Other (See Comments)    Has patient had a PCN reaction causing immediate rash, facial/tongue/throat swelling, SOB or lightheadedness with hypotension: Yes Has patient had a PCN reaction causing severe rash involving mucus membranes or skin necrosis: Yes Has patient had a PCN reaction that required hospitalization No Has patient had a PCN reaction occurring within the last 10 years: No If all of the above answers are NO, then may proceed with Cephalosporin use.     Family History  Problem Relation Age of Onset   Kidney Stones Father    Prostate cancer Father    Heart disease Father    Kidney Stones Brother    Heart disease Mother     Prior to Admission medications   Medication Sig Start Date End Date Taking? Authorizing Provider  acetaminophen  (TYLENOL ) 325 MG tablet Take 650 mg by mouth every 6 (six) hours as needed (for pain.).    [provider]  allopurinol  (ZYLOPRIM ) 100 MG tablet Take 100 mg by mouth daily with lunch.     [provider]  aspirin  325 MG tablet Take 325 mg by mouth daily with lunch.  04/07/11   [provider]  AZO-CRANBERRY PO Take 1 capsule by mouth daily with lunch.  Patient not taking: Reported on 03/11/2024    [provider]  calcitRIOL (ROCALTROL) 0.25 MCG capsule 1 capsule once daily 09/14/20   [provider]  calcium acetate (PHOSLO) 667 MG tablet Take by mouth. 11/08/23   [provider]  CINNAMON PO Take 1,000 mg by mouth.    [provider]  clindamycin (CLEOCIN) 300 MG capsule Take 300 mg by mouth. 06/12/23   [provider]  clopidogrel  (PLAVIX ) 75 MG tablet Take 75 mg by mouth daily with lunch.  08/11/14   [provider]  co-enzyme Q-10 30 MG capsule Take 30 mg by mouth daily. Patient not taking: Reported on 03/11/2024    [provider]  ergocalciferol (VITAMIN D2) 1.25 MG (50000 UT) capsule Take by mouth. Patient not taking: Reported on 03/11/2024 09/30/20   [provider]  furosemide  (LASIX ) 40 MG tablet Take 20 mg by mouth once a week. Every other day with lunch    [provider]  gabapentin (NEURONTIN) 100 MG capsule Take 100 mg by mouth at bedtime.  11/12/18   [provider]  Garlic 1000 MG CAPS Take by mouth.    [provider]  hydrOXYzine (ATARAX) 25 MG tablet TAKE 1 TABLET (25 MG TOTAL) BY MOUTH ONCE DAILY AS NEEDED FOR ITCHING FOR UP TO 90 DAYS 02/13/24   [provider]  hyoscyamine (LEVSIN SL) 0.125 MG SL tablet Take 0.125 mg by mouth daily. Patient not taking: Reported on 03/11/2024 04/07/11   [provider]  isosorbide  mononitrate (IMDUR ) 60 MG 24 hr tablet Take 60 mg by mouth daily.  08/11/14   [provider]  Magnesium Citrate 100 MG TABS Take 1 tablet by mouth daily.    [provider]  MAGNESIUM CITRATE PO Take 100 mg by mouth daily.     [provider]  metoprolol  succinate (TOPROL -XL) 25 MG 24 hr tablet Take 25 mg by mouth daily with lunch. 1/2 tab QD 04/07/11   [provider]  nitroGLYCERIN  (NITROSTAT ) 0.4 MG  SL tablet Place 0.4 mg under the tongue as needed for chest pain.    [provider]  ondansetron  (ZOFRAN -ODT) 4 MG disintegrating tablet Take 4 mg by mouth 3 (three) times daily as needed. 06/01/23   [provider]  pantoprazole  (PROTONIX ) 40 MG tablet Take 40 mg by mouth daily before breakfast.     [provider]  potassium chloride  (KLOR-CON  M) 10 MEQ tablet Take by mouth.    [provider]  potassium chloride  (MICRO-K ) 10 MEQ CR capsule Take 20 mEq by mouth in the morning and at bedtime.  07/17/14   [provider]  promethazine  (PHENERGAN ) 25 MG tablet Take 25 mg by mouth every 8 (eight) hours as needed for nausea or vomiting.  06/14/11   [provider]  ranolazine (RANEXA) 500 MG 12 hr tablet Take by mouth. 11/23/21   [provider]  riboflavin (VITAMIN B-2) 100 MG TABS tablet Take 100 mg by mouth daily with lunch.  Patient not taking: Reported on 03/11/2024    [provider]  senna (SENOKOT) 8.6 MG tablet Take 4-6 tablets by mouth at bedtime.     [provider]  sodium bicarbonate 650 MG tablet Take 650 mg by mouth daily with lunch.    [provider]  vitamin E 400 UNIT capsule Take 400 Units by mouth daily with lunch.     [provider]    Social History:  reports that she has never smoked. She has never been exposed to tobacco smoke. She has never used smokeless tobacco. She reports that she does not drink alcohol and does not use drugs. Lives by herself Tobacco- Denies use. EtOH- Denies use.  Illicit drug use- denies use.  IADLs/ADLs- can perform independently at baseline    Physical Exam: Vitals:   04/11/24 1312 04/11/24 1313 04/11/24 1406  BP:  122/60   Pulse:  91   Resp:  18   Temp:  98.4 F (36.9 C)   TempSrc:  Oral   SpO2:  100% 100%  Weight: 45.4 kg    Height: 5' 3 (1.6 m)       Gen: NAD HENT: NCAT CV: Regular rate and rhythm, good pulses in all  extremities Resp: CTAB Abd: No TTP MSK: No symmetry Skin: No lesions noted on  skin Neuro: Alert oriented x 4, CN II-XII grossly intact, good sensation and strength in all extremities Psych: Pleasant and   Labs on Admission: I have personally reviewed following labs and imaging studies  CBC: Recent Labs  Lab 04/11/24 1315  WBC 7.5  HGB 9.9*  HCT 32.8*  MCV 104.5*  PLT 318   Basic Metabolic Panel: Recent Labs  Lab 04/11/24 1315  NA 141  K 4.5  CL 108  CO2 20*  GLUCOSE 124*  BUN 47*  CREATININE 3.57*  CALCIUM 9.3   GFR: Estimated Creatinine Clearance: 9.6 mL/min (A) (by C-G formula based on SCr of 3.57 mg/dL (H)). Liver Function Tests: Recent Labs  Lab 04/11/24 1315  AST 18  ALT 17  ALKPHOS 39  BILITOT <0.2  PROT 5.9*  ALBUMIN 3.8   No results for input(s): LIPASE, AMYLASE in the last 168 hours. No results for input(s): AMMONIA in the last 168 hours. Coagulation Profile: No results for input(s): INR, PROTIME in the last 168 hours. Cardiac Enzymes: No results for input(s): CKTOTAL, CKMB, CKMBINDEX, TROPONINI, TROPONINIHS in the last 168 hours. BNP (last 3 results) No results for input(s): BNP in the last 8760 hours. HbA1C: No results for input(s): HGBA1C in the last 72 hours. CBG: No results for input(s): GLUCAP in the last 168 hours. Lipid Profile: No results for input(s): CHOL, HDL, LDLCALC, TRIG, CHOLHDL, LDLDIRECT in the last 72 hours. Thyroid Function Tests: No results for input(s): TSH, T4TOTAL, FREET4, T3FREE, THYROIDAB in the last 72 hours. Anemia Panel: No results for input(s): VITAMINB12, FOLATE, FERRITIN, TIBC, IRON , RETICCTPCT in the last 72 hours. Urine analysis:    Component Value Date/Time   COLORURINE YELLOW (A) 04/11/2024 1315   APPEARANCEUR CLOUDY (A) 04/11/2024 1315   APPEARANCEUR Clear 05/13/2015 0906   LABSPEC 1.021 04/11/2024 1315   PHURINE 5.0 04/11/2024 1315    GLUCOSEU NEGATIVE 04/11/2024 1315   HGBUR NEGATIVE 04/11/2024 1315   BILIRUBINUR NEGATIVE 04/11/2024 1315   BILIRUBINUR Negative 05/13/2015 0906   KETONESUR NEGATIVE 04/11/2024 1315   PROTEINUR NEGATIVE 04/11/2024 1315   UROBILINOGEN 0.2 11/30/2006 1230   NITRITE NEGATIVE 04/11/2024 1315   LEUKOCYTESUR LARGE (A) 04/11/2024 1315    Radiological Exams on Admission: I have personally reviewed images CT Head Wo Contrast Result Date: 04/11/2024 EXAM: CT HEAD WITHOUT CONTRAST 04/11/2024 01:33:32 PM TECHNIQUE: CT of the head was performed without the administration of intravenous contrast. Automated exposure control, iterative reconstruction, and/or weight based adjustment of the mA/kV was utilized to reduce the radiation dose to as low as reasonably achievable. COMPARISON: MRI of the head dated 11/22/2023. CLINICAL HISTORY: Head trauma, GCS=15, no focal neuro findings (low risk) (Ped 0-17y) FINDINGS: BRAIN AND VENTRICLES: No acute hemorrhage. No evidence of acute infarct. No hydrocephalus. No extra-axial collection. No mass effect or midline shift. Mild periventricular white matter disease. ORBITS: No acute abnormality. Status post bilateral lens replacement. SINUSES: No acute abnormality. SOFT TISSUES AND SKULL: No acute soft tissue abnormality. No skull fracture. Moderate vascular calcifications. IMPRESSION: 1. No acute intracranial abnormality. 2. Mild periventricular white matter disease. Electronically signed by: Evalene Coho MD 04/11/2024 02:03 PM EST RP Workstation: [id number]   CT Cervical Spine Wo Contrast Result Date: 04/11/2024 CLINICAL DATA:  Neck trauma (Age >= 65y) Three recent falls. EXAM: CT CERVICAL SPINE WITHOUT CONTRAST TECHNIQUE: Multidetector CT imaging of the cervical spine was performed without intravenous contrast. Multiplanar CT image reconstructions were also generated. RADIATION DOSE REDUCTION: This exam was performed according to the departmental dose-optimization program  which includes automated exposure control, adjustment of the mA and/or kV according to patient size and/or use of iterative reconstruction technique. COMPARISON:  None Available. FINDINGS: Alignment: Convex left scoliosis. 3 mm of anterolisthesis at C3-4 and C4-5 which appears facet-mediated. Skull base and vertebrae: No evidence of acute fracture or traumatic subluxation. Soft tissues and spinal canal: No prevertebral fluid or swelling. No visible canal hematoma. Disc levels: Multilevel spondylosis. There is severe disc space narrowing and uncinate spurring at C4-5, C5-6 and C6-7. There is advanced asymmetric right-sided facet hypertrophy at C3-4 and C4-5. Mild multilevel foraminal narrowing appears greatest on the left at C5-6. No large disc herniation or high-grade spinal stenosis demonstrated. Upper chest: Mild emphysematous changes at both lung apices. Other: Bilateral carotid atherosclerosis. IMPRESSION: 1. No evidence of acute cervical spine fracture, traumatic subluxation or static signs of instability. 2. Multilevel cervical spondylosis as described. Electronically Signed   By: Elsie Perone M.D.   On: 04/11/2024 13:59    EKG: My personal interpretation of EKG shows: Normal sinus rhythm, with pronounced R waves concerning for LVH.  This is changed from prior EKG as previous EKGs showed decreased voltages.  Assessment/Plan Principal Problem:   Syncope and collapse Active Problems:   Gastro-esophageal reflux disease without esophagitis   Hyperlipidemia   CAD (coronary artery disease)   Coronary atherosclerosis of autologous vein bypass graft   Hypertension   CKD (chronic kidney disease) stage 4, GFR 15-29 ml/min (HCC) Patient with 3 episodes of syncope without a prodrome with significant cardiac history.  EKG without any arrhythmia and she denies any palpitations or any other symptoms prior to the syncopal episodes.  Her episodes are concerning for cardiac syncope.  Will continue to monitor  on telemetry and get echocardiogram.  Check and replete her electrolytes as necessary.  Will obtain orthostatic vitals but she is denying any orthostatic symptoms.  Will consult cardiology she may need long-term cardiac monitoring and for further evaluation.  She established with [facility] clinic.  Right lower extremity edema: Getting Doppler as patient is having some pain along with edema  UTI?  Patient urinalysis consistent with UTI but she is asymptomatic.  She has received Levaquin in the ED but I would hold further antibiotics until culture results given she is asymptomatic and her syncope is unrelated and it is more suggestive of cardiac etiology.  Chronic Problems: Restart home meds once med reconciliation is completed.  HTN: continue home meds HLD: continue home meds GERD: continue home PPI Hypothyroidism: continue home synthroid COPD/Asthma: continue home inhalers MDD/GAD: continue home meds CKDIV: Patient follows with Dr. Dennise, will renal function is at baseline and she is having adequate urine output.  Monitor renal fxn daily, and renally dose meds, avoid nephrotoxic meds.  Continue home bicarb  VTE prophylaxis:  SQ Heparin   Diet: Heart healthy Code Status:  Full Code Telemetry:  Admission status: Observation, Telemetry bed Patient is from: Home Anticipated d/c is to: Home Anticipated d/c is in: 1-2 days   Family Communication: Updated at bedside  Consults called: Cardiology, epic chat sent to Dr. Florencio   Severity of Illness: The appropriate patient status for this patient is OBSERVATION. Observation status is judged to be reasonable and necessary in order to provide the required intensity of service to ensure the patient's safety. The patient's presenting symptoms, physical exam findings, and initial radiographic and laboratory data in the context of their medical condition is felt to place them at decreased risk for further clinical deterioration. Furthermore, it is  anticipated that the patient will be medically stable for discharge from the hospital within 2 midnights of admission.    Morene Bathe, MD Jolynn DEL. [facility]

## 2024-04-11 NOTE — Progress Notes (Signed)
 PHARMACY NOTE:  ANTIMICROBIAL RENAL DOSAGE ADJUSTMENT  Current antimicrobial regimen includes a mismatch between antimicrobial dosage and estimated renal function.  As per policy approved by the Pharmacy & Therapeutics and Medical Executive Committees, the antimicrobial dosage will be adjusted accordingly.  Current antimicrobial dosage:  Levofloxacin 750mg    Indication: UTI  Renal Function:  Estimated Creatinine Clearance: 9.6 mL/min (A) (by C-G formula based on SCr of 3.57 mg/dL (H)). []      On intermittent HD, scheduled: []      On CRRT    Antimicrobial dosage has been changed to:  Levofloxacin 500mg  x1  Additional comments:   Thank you for allowing pharmacy to be a part of this patient's care.  Estill CHRISTELLA Lutes, PharmD, BCPS Clinical Pharmacist 04/11/2024 3:08 PM

## 2024-04-11 NOTE — ED Triage Notes (Signed)
 Patient states she has had multiple syncopal episodes with falls over the past few weeks; the last episode was on Thanksgiving. Called her Neurologist and was told to come to ED. Patient has a history of TIA's and is currently on Plavix . Patient denies pain from the falls.

## 2024-04-11 NOTE — ED Notes (Signed)
 NURSE HAILEY RN INFORMED OF BED ASSIGNED

## 2024-04-11 NOTE — ED Provider Notes (Signed)
 [facility] Provider Note    Event Date/Time   First MD Initiated Contact with Patient 04/11/24 1352     (approximate)   History   Loss of Consciousness   HPI  [name] is a 78 year old female with history of CAD, CKD presenting to the emergency department for evaluation of multiple syncopal episodes.  Patient reports that in the past 2 weeks she has had 3 episodes of loss of consciousness without precipitant or prodrome.  She does think she has had episodes of syncope prior to this but never this frequently and typically can tell that she is about to pass out.  She saw her physical therapist who recommended she call her neurologist who recommended ER presentation.  Denies numbness, tingling, focal weakness.  No known head trauma.  At least 1 episode was witnessed without reported seizure-like activity.  No noted injuries.  No chest pain or shortness of breath.     Physical Exam   Triage Vital Signs: ED Triage Vitals  Encounter Vitals Group     BP 04/11/24 1313 122/60     Girls Systolic BP Percentile --      Girls Diastolic BP Percentile --      Boys Systolic BP Percentile --      Boys Diastolic BP Percentile --      Pulse Rate 04/11/24 1313 91     Resp 04/11/24 1313 18     Temp 04/11/24 1313 98.4 F (36.9 C)     Temp Source 04/11/24 1313 Oral     SpO2 04/11/24 1313 100 %     Weight 04/11/24 1312 100 lb (45.4 kg)     Height 04/11/24 1312 5' 3 (1.6 m)     Head Circumference --      Peak Flow --      Pain Score 04/11/24 1312 0     Pain Loc --      Pain Education --      Exclude from Growth Chart --     Most recent vital signs: Vitals:   04/11/24 1313 04/11/24 1406  BP: 122/60   Pulse: 91   Resp: 18   Temp: 98.4 F (36.9 C)   SpO2: 100% 100%     General: Awake, interactive  CV:  Good peripheral perfusion Resp:  Unlabored respirations, lungs clear to auscultation Abd:  Nondistended.  Neuro:  Alert and oriented, normal  extraocular movements, symmetric facial movement, sensation intact over bilateral upper and lower extremities with 5 out of 5 strength.  Normal finger-to-nose testing.  Right   ED Results / Procedures / Treatments   Labs (all labs ordered are listed, but only abnormal results are displayed) Labs Reviewed  COMPREHENSIVE METABOLIC PANEL WITH GFR - Abnormal; Notable for the following components:      Result Value   CO2 20 (*)    Glucose, Bld 124 (*)    BUN 47 (*)    Creatinine, Ser 3.57 (*)    Total Protein 5.9 (*)    GFR, Estimated 13 (*)    All other components within normal limits  CBC - Abnormal; Notable for the following components:   RBC 3.14 (*)    Hemoglobin 9.9 (*)    HCT 32.8 (*)    MCV 104.5 (*)    RDW 15.7 (*)    All other components within normal limits  URINALYSIS, ROUTINE W REFLEX MICROSCOPIC - Abnormal; Notable for the following components:   Color, Urine YELLOW (*)  APPearance CLOUDY (*)    Leukocytes,Ua LARGE (*)    Bacteria, UA FEW (*)    All other components within normal limits  URINE CULTURE  MAGNESIUM  TSH  CBG MONITORING, ED  TROPONIN T, HIGH SENSITIVITY     EKG EKG independently reviewed and interpreted by myself demonstrates:  EKG demonstrates normal sinus rhythm at rate 77, PR 166, QRS 86 and QTc 409, no acute ST changes  RADIOLOGY Imaging independently reviewed and interpreted by myself demonstrates:  CT head without acute bleed CT C-spine without acute fracture  Formal Radiology Read:  CT Head Wo Contrast Result Date: 04/11/2024 EXAM: CT HEAD WITHOUT CONTRAST 04/11/2024 01:33:32 PM TECHNIQUE: CT of the head was performed without the administration of intravenous contrast. Automated exposure control, iterative reconstruction, and/or weight based adjustment of the mA/kV was utilized to reduce the radiation dose to as low as reasonably achievable. COMPARISON: MRI of the head dated 11/22/2023. CLINICAL HISTORY: Head trauma, GCS=15, no focal  neuro findings (low risk) (Ped 0-17y) FINDINGS: BRAIN AND VENTRICLES: No acute hemorrhage. No evidence of acute infarct. No hydrocephalus. No extra-axial collection. No mass effect or midline shift. Mild periventricular white matter disease. ORBITS: No acute abnormality. Status post bilateral lens replacement. SINUSES: No acute abnormality. SOFT TISSUES AND SKULL: No acute soft tissue abnormality. No skull fracture. Moderate vascular calcifications. IMPRESSION: 1. No acute intracranial abnormality. 2. Mild periventricular white matter disease. Electronically signed by: Evalene Coho MD 04/11/2024 02:03 PM EST RP Workstation: [id number]   CT Cervical Spine Wo Contrast Result Date: 04/11/2024 CLINICAL DATA:  Neck trauma (Age >= 65y) Three recent falls. EXAM: CT CERVICAL SPINE WITHOUT CONTRAST TECHNIQUE: Multidetector CT imaging of the cervical spine was performed without intravenous contrast. Multiplanar CT image reconstructions were also generated. RADIATION DOSE REDUCTION: This exam was performed according to the departmental dose-optimization program which includes automated exposure control, adjustment of the mA and/or kV according to patient size and/or use of iterative reconstruction technique. COMPARISON:  None Available. FINDINGS: Alignment: Convex left scoliosis. 3 mm of anterolisthesis at C3-4 and C4-5 which appears facet-mediated. Skull base and vertebrae: No evidence of acute fracture or traumatic subluxation. Soft tissues and spinal canal: No prevertebral fluid or swelling. No visible canal hematoma. Disc levels: Multilevel spondylosis. There is severe disc space narrowing and uncinate spurring at C4-5, C5-6 and C6-7. There is advanced asymmetric right-sided facet hypertrophy at C3-4 and C4-5. Mild multilevel foraminal narrowing appears greatest on the left at C5-6. No large disc herniation or high-grade spinal stenosis demonstrated. Upper chest: Mild emphysematous changes at both lung apices.  Other: Bilateral carotid atherosclerosis. IMPRESSION: 1. No evidence of acute cervical spine fracture, traumatic subluxation or static signs of instability. 2. Multilevel cervical spondylosis as described. Electronically Signed   By: Elsie Perone M.D.   On: 04/11/2024 13:59    PROCEDURES:  Critical Care performed: No  Procedures   MEDICATIONS ORDERED IN ED: Medications  levofloxacin (LEVAQUIN) IVPB 500 mg (500 mg Intravenous New Bag/Given 04/11/24 1518)  sodium chloride  flush (NS) 0.9 % injection 3 mL (has no administration in time range)  acetaminophen  (TYLENOL ) tablet 650 mg (has no administration in time range)    Or  acetaminophen  (TYLENOL ) suppository 650 mg (has no administration in time range)  senna-docusate (Senokot-S) tablet 1 tablet (has no administration in time range)  heparin  injection 5,000 Units (has no administration in time range)     IMPRESSION / MDM / ASSESSMENT AND PLAN / ED COURSE  I reviewed the triage vital signs  and the nursing notes.  Differential diagnosis includes, but is not limited to, intracranial bleed, spine fracture, anemia, electrolyte abnormality, arrhythmia  Patient's presentation is most consistent with acute presentation with potential threat to life or bodily function.  78 year old female presenting to the emergency department for evaluation of multiple syncopal episodes without prodrome.  Stable vitals here.  EKG without acute ischemic findings, but concerning clinical history of multiple syncopal episodes.  No focal deficits suggestive of CVA.  Do think patient is appropriate for admission for further evaluation.  Urinalysis here is concerning for infection.  Urine culture sent.  Cephalosporin allergy, ordered for Levaquin.  Will reach out to hospitalist team.  Discussed with hospitalist team.  They will evaluate for anticipated admission.     FINAL CLINICAL IMPRESSION(S) / ED DIAGNOSES   Final diagnoses:  Syncope and collapse   Urinary tract infection without hematuria, site unspecified     Rx / DC Orders   ED Discharge Orders     None        Note:  This document was prepared using Dragon voice recognition software and may include unintentional dictation errors.   Levander Slate, MD 04/11/24 [phone]

## 2024-04-12 ENCOUNTER — Observation Stay: Admit: 2024-04-12 | Discharge: 2024-04-12 | Disposition: A | Attending: Internal Medicine

## 2024-04-12 ENCOUNTER — Ambulatory Visit

## 2024-04-12 ENCOUNTER — Observation Stay

## 2024-04-12 DIAGNOSIS — I10 Essential (primary) hypertension: Secondary | ICD-10-CM | POA: Diagnosis not present

## 2024-04-12 DIAGNOSIS — I25118 Atherosclerotic heart disease of native coronary artery with other forms of angina pectoris: Secondary | ICD-10-CM

## 2024-04-12 DIAGNOSIS — R55 Syncope and collapse: Secondary | ICD-10-CM | POA: Diagnosis not present

## 2024-04-12 DIAGNOSIS — N184 Chronic kidney disease, stage 4 (severe): Secondary | ICD-10-CM | POA: Diagnosis not present

## 2024-04-12 LAB — ECHOCARDIOGRAM COMPLETE
AR max vel: 1.54 cm2
AV Area VTI: 1.42 cm2
AV Area mean vel: 1.45 cm2
AV Mean grad: 6 mmHg
AV Peak grad: 10.2 mmHg
Ao pk vel: 1.6 m/s
Area-P 1/2: 3.19 cm2
Height: 63 in
MV VTI: 2.34 cm2
S' Lateral: 2.6 cm
Weight: 1707.24 [oz_av]

## 2024-04-12 LAB — CBC
HCT: 30.9 % — ABNORMAL LOW (ref 36.0–46.0)
Hemoglobin: 9.6 g/dL — ABNORMAL LOW (ref 12.0–15.0)
MCH: 31.9 pg (ref 26.0–34.0)
MCHC: 31.1 g/dL (ref 30.0–36.0)
MCV: 102.7 fL — ABNORMAL HIGH (ref 80.0–100.0)
Platelets: 280 K/uL (ref 150–400)
RBC: 3.01 MIL/uL — ABNORMAL LOW (ref 3.87–5.11)
RDW: 15.7 % — ABNORMAL HIGH (ref 11.5–15.5)
WBC: 6.2 K/uL (ref 4.0–10.5)
nRBC: 0 % (ref 0.0–0.2)

## 2024-04-12 LAB — BASIC METABOLIC PANEL WITH GFR
Anion gap: 12 (ref 5–15)
BUN: 44 mg/dL — ABNORMAL HIGH (ref 8–23)
CO2: 23 mmol/L (ref 22–32)
Calcium: 9.3 mg/dL (ref 8.9–10.3)
Chloride: 106 mmol/L (ref 98–111)
Creatinine, Ser: 3.37 mg/dL — ABNORMAL HIGH (ref 0.44–1.00)
GFR, Estimated: 14 mL/min — ABNORMAL LOW (ref >60)
Glucose, Bld: 81 mg/dL (ref 70–99)
Potassium: 4.7 mmol/L (ref 3.5–5.1)
Sodium: 142 mmol/L (ref 135–145)

## 2024-04-12 MED ORDER — CLOPIDOGREL BISULFATE 75 MG PO TABS
75.0000 mg | ORAL_TABLET | Freq: Every day | ORAL | Status: DC
  Administered 2024-04-12: 75 mg via ORAL
  Filled 2024-04-12: qty 1

## 2024-04-12 MED ORDER — ASPIRIN 81 MG PO TBEC
81.0000 mg | DELAYED_RELEASE_TABLET | Freq: Every day | ORAL | Status: DC
  Administered 2024-04-12: 81 mg via ORAL
  Filled 2024-04-12: qty 1

## 2024-04-12 MED ORDER — ASPIRIN 81 MG PO TBEC: 81.0000 mg | DELAYED_RELEASE_TABLET | Freq: Every day | ORAL | 12 refills | Status: AC

## 2024-04-12 MED ORDER — ISOSORBIDE MONONITRATE ER 30 MG PO TB24
60.0000 mg | ORAL_TABLET | Freq: Every day | ORAL | Status: DC
  Administered 2024-04-12: 60 mg via ORAL
  Filled 2024-04-12: qty 2

## 2024-04-12 MED ORDER — METOPROLOL SUCCINATE ER 25 MG PO TB24
25.0000 mg | ORAL_TABLET | Freq: Every day | ORAL | Status: DC
  Administered 2024-04-12: 25 mg via ORAL
  Filled 2024-04-12: qty 1

## 2024-04-12 NOTE — Plan of Care (Signed)
  Problem: Clinical Measurements: Goal: Will remain free from infection Outcome: Progressing Goal: Cardiovascular complication will be avoided Outcome: Progressing   Problem: Coping: Goal: Level of anxiety will decrease Outcome: Progressing   Problem: Safety: Goal: Ability to remain free from injury will improve Outcome: Progressing   

## 2024-04-12 NOTE — Progress Notes (Signed)
 Orders only for 2 week holter for syncope.   [name]

## 2024-04-12 NOTE — TOC CM/SW Note (Signed)
 Transition of Care [facility]) - Inpatient Brief Assessment   Patient Details  Name: JANETH TERRY MRN: [id number] Date of Birth: 1946/02/07  [facility] CM/SW Contact:    Daved JONETTA Hamilton, RN Phone Number: 04/12/2024, 10:16 AM   Clinical Narrative:   Transition of Care [facility]) Screening Note   Patient Details  Name: TAMMEE THIELKE Date of Birth: 07/08/1945   Transition of Care [facility]) CM/SW Contact:    Daved JONETTA Hamilton, RN Phone Number: 04/12/2024, 10:16 AM    Transition of Care [facility]) has reviewed patient and no TOC needs have been identified at this time. If new patient transition needs arise, please place a TOC consult.    Transition of Care Asessment: Insurance and Status: Insurance coverage has been reviewed Patient has primary care physician: Yes   Prior level of function:: Independent Prior/Current Home Services: No current home services Social Drivers of Health Review: SDOH reviewed no interventions necessary Readmission risk has been reviewed: No (patient is in observation status, no score generated) Transition of care needs: no transition of care needs at this time

## 2024-04-12 NOTE — Progress Notes (Signed)
*  PRELIMINARY RESULTS* Echocardiogram 2D Echocardiogram has been performed.  Floydene Harder 04/12/2024, 10:19 AM

## 2024-04-12 NOTE — Care Management Obs Status (Signed)
 MEDICARE OBSERVATION STATUS NOTIFICATION   Patient Details  Name: [name] MRN: [id number] Date of Birth: January 15, 1946   Medicare Observation Status Notification Given:  Yes    Marius Betts W, CMA 04/12/2024, 1:48 PM

## 2024-04-12 NOTE — Discharge Summary (Signed)
 Physician Discharge Summary   Patient: [name] MRN: [id number] DOB: 1945-07-08  Admit date:     04/11/2024  Discharge date: 04/12/24  Discharge Physician: Leita Blanch   PCP: Sadie Manna, MD   Recommendations at discharge:   follow-up cardiology Dr. Carnella on 29th December follow-up PCP in 1 to 2 week  Discharge Diagnoses: Principal Problem:   Syncope and collapse Active Problems:   Gastro-esophageal reflux disease without esophagitis   Hyperlipidemia   CAD (coronary artery disease)   Coronary atherosclerosis of autologous vein bypass graft   Hypertension   CKD (chronic kidney disease) stage 4, GFR 15-29 ml/min (HCC)  [name] is a 78 y.o. year old female with medical history of hypertension, hyperlipidemia complicated by CAD status post CABG, GERD presenting to the ED after having multiple syncopal episodes over the last 2 weeks.  She discussed this with her neurology office and they recommended she present to the ED.  CT head and cervical spine without any acute findings.    Syncope--etiology unclear --pt feels at baseline --seen by cardiology and recommends cardiac monitor for home which has been placed.  -- Patient otherwise hemodynamically stable. No dizziness  --patient with 3 episodes of syncope without a prodrome with significant cardiac history.   --EKG without any arrhythmia and she denies any palpitations or any other symptoms prior to the syncopal episodes.   -- Patient will follow-up with [facility] cardiology on 29th December   right lower extremity edema:negative doppler Take lasix  ss needed  UTI?  Patient urinalysis consistent with UTI but she is asymptomatic.  She has received Levaquin  in the ED but I would hold further antibiotics until culture results given she is asymptomatic and her syncope is unrelated and it is more suggestive of cardiac etiology.   HTN: continue home meds  COPD/Asthma: continue home inhalers  MDD/GAD: continue  home meds  CKDIV: Patient follows with Dr. Dennise, will renal function is at baseline and she is having adequate urine output.  avoid nephrotoxic meds.  Continue home bicarb, Phoslo and calcitriol  Patient overall feels better. She is eager to discharge. Holter monitor has been placed by [facility] cardiology.       Consultants: [facility] cardiology Procedures performed: none Disposition: Home Diet recommendation:  Discharge Diet Orders (From admission, onward)     Start     Ordered   04/12/24 0000  Diet - low sodium heart healthy        04/12/24 1506           Cardiac diet DISCHARGE MEDICATION: Allergies as of 04/12/2024       Reactions   Prochlorperazine Nausea And Vomiting, Other (See Comments), Nausea Only   Severe vomiting   Ezetimibe Other (See Comments)   Muscle Pain   Statins Other (See Comments)   Muscle Pain   Tape Other (See Comments)   Pulls my skin off when the tape is removed. Use paper tape only.   Cephalexin Rash   Metoclopramide Rash, Other (See Comments)   Elevates BP, face draws to one side   Penicillins Rash, Other (See Comments)   Has patient had a PCN reaction causing immediate rash, facial/tongue/throat swelling, SOB or lightheadedness with hypotension: Yes Has patient had a PCN reaction causing severe rash involving mucus membranes or skin necrosis: Yes Has patient had a PCN reaction that required hospitalization No Has patient had a PCN reaction occurring within the last 10 years: No If all of the above answers are NO, then  may proceed with Cephalosporin use.        Medication List     TAKE these medications    acetaminophen  325 MG tablet Commonly known as: TYLENOL  Take 650 mg by mouth every 6 (six) hours as needed (for pain.).   allopurinol  100 MG tablet Commonly known as: ZYLOPRIM  Take 100 mg by mouth daily with lunch.   ALPRAZolam 0.25 MG dissolvable tablet Commonly known as: NIRAVAM Take 0.25 mg by mouth 2 (two) times daily as needed.    aspirin  EC 81 MG tablet Take 1 tablet (81 mg total) by mouth daily. Swallow whole. Start taking on: April 13, 2024   calcitRIOL 0.25 MCG capsule Commonly known as: ROCALTROL Take 0.25 mcg by mouth every Monday, Wednesday, and Friday.   calcium acetate 667 MG tablet Commonly known as: PHOSLO Take 1,334 mg by mouth 3 (three) times daily with meals.   CINNAMON PO Take 1,000 mg by mouth.   clopidogrel  75 MG tablet Commonly known as: PLAVIX  Take 75 mg by mouth daily with lunch.   furosemide  40 MG tablet Commonly known as: LASIX  Take 20 mg by mouth as needed for fluid or edema. Every other day with lunch   gabapentin 100 MG capsule Commonly known as: NEURONTIN Take 100 mg by mouth daily with lunch.   gabapentin 100 MG capsule Commonly known as: NEURONTIN Take 100 mg by mouth at bedtime.   Garlic 1000 MG Caps Take by mouth.   hydrOXYzine 25 MG tablet Commonly known as: ATARAX TAKE 1 TABLET (25 MG TOTAL) BY MOUTH ONCE DAILY AS NEEDED FOR ITCHING FOR UP TO 90 DAYS   isosorbide  mononitrate 60 MG 24 hr tablet Commonly known as: IMDUR  Take 60 mg by mouth 2 (two) times daily.   Magnesium Citrate 100 MG Tabs Take 1 tablet by mouth daily.   metoprolol  succinate 25 MG 24 hr tablet Commonly known as: TOPROL -XL Take 25 mg by mouth daily.   nitroGLYCERIN  0.4 MG SL tablet Commonly known as: NITROSTAT  Place 0.4 mg under the tongue as needed for chest pain.   ondansetron  4 MG disintegrating tablet Commonly known as: ZOFRAN -ODT Take 4 mg by mouth 3 (three) times daily as needed.   pantoprazole  40 MG tablet Commonly known as: PROTONIX  Take 40 mg by mouth daily before breakfast.   potassium chloride  10 MEQ tablet Commonly known as: KLOR-CON  M Take 20 mEq by mouth daily.   promethazine  25 MG tablet Commonly known as: PHENERGAN  Take 25 mg by mouth every 8 (eight) hours as needed for nausea or vomiting.   ranolazine 500 MG 12 hr tablet Commonly known as: RANEXA Take 500  mg by mouth at bedtime.   ranolazine 500 MG 12 hr tablet Commonly known as: RANEXA Take 250 mg by mouth daily.   senna 8.6 MG tablet Commonly known as: SENOKOT Take 4-6 tablets by mouth at bedtime.   sertraline 50 MG tablet Commonly known as: ZOLOFT Take 50 mg by mouth daily as needed (anxiety).   sodium bicarbonate 650 MG tablet Take 650 mg by mouth daily with lunch.   traMADol  50 MG tablet Commonly known as: ULTRAM  Take 50 mg by mouth every 8 (eight) hours as needed.   vitamin E 180 MG (400 UNITS) capsule Take 400 Units by mouth daily with lunch.        Follow-up Information     Paraschos, Alexander, MD. Go in 3 week(s).   Specialty: Cardiology Why: Appointment scheduled for 05/06/2024 at 10:30 AM Contact information: [address]  [facility] [place] [address] [phone]         Sadie Manna, MD. Schedule an appointment as soon as possible for a visit in 1 week(s).   Specialty: Internal Medicine Contact information: [address] [place] [address] [phone]         Dennise Capri, MD. Go to.   Specialty: Nephrology Why: on your next scheduled appt Contact information: [address] [place] [address] [phone]                Discharge Exam: Fredricka Weights   04/11/24 1312 04/12/24 0500  Weight: 45.4 kg 48.4 kg   Alert and oriented times three. Cardiovascular both heart sounds normal no murmur respiratory clear to auscultation neuro- grossly intact  Condition at discharge: fair  The results of significant diagnostics from this hospitalization (including imaging, microbiology, ancillary and laboratory) are listed below for reference.   Imaging Studies: ECHOCARDIOGRAM COMPLETE Result Date: 04/12/2024    ECHOCARDIOGRAM REPORT   Patient Name:   [name] Date of Exam: 04/12/2024 Medical Rec #:  [id number]          Height:       63.0 in  Accession #:    [id number]         Weight:       106.7 lb Date of Birth:  1945/07/08         BSA:          1.481 m Patient Age:    78 years           BP:           160/75 mmHg Patient Gender: F                  HR:           86 bpm. Exam Location:  [facility] Procedure: 2D Echo, Cardiac Doppler and Color Doppler (Both Spectral and Color            Flow Doppler were utilized during procedure). Indications:     Syncope R55  History:         Patient has no prior history of Echocardiogram examinations.                  CAD; Risk Factors:Hypertension.  Sonographer:     Christopher Furnace Referring Phys:  [id number] [facility] Diagnosing Phys: Cara JONETTA Lovelace MD IMPRESSIONS  1. Left ventricular ejection fraction, by estimation, is 55 to 60%. The left ventricle has normal function. The left ventricle has no regional wall motion abnormalities. Left ventricular diastolic parameters are consistent with Grade I diastolic dysfunction (impaired relaxation).  2. Right ventricular systolic function is normal. The right ventricular size is normal.  3. The mitral valve is grossly normal. Trivial mitral valve regurgitation.  4. The aortic valve is grossly normal. Aortic valve regurgitation is not visualized. Mild aortic valve stenosis. FINDINGS  Left Ventricle: Left ventricular ejection fraction, by estimation, is 55 to 60%. The left ventricle has normal function. The left ventricle has no regional wall motion abnormalities. Strain was performed and the global longitudinal strain is indeterminate. The left ventricular internal cavity size was normal in size. There is borderline left ventricular hypertrophy. Left ventricular diastolic parameters are consistent with Grade I diastolic dysfunction (impaired relaxation). Right Ventricle: The right ventricular size is normal. No increase in right ventricular wall thickness. Right ventricular systolic function is normal. Left Atrium: Left atrial  size was normal in size. Right Atrium: Right atrial size  was normal in size. Pericardium: There is no evidence of pericardial effusion. Mitral Valve: The mitral valve is grossly normal. There is mild thickening of the mitral valve leaflet(s). There is mild calcification of the mitral valve leaflet(s). Trivial mitral valve regurgitation. MV peak gradient, 2.8 mmHg. The mean mitral valve gradient is 1.0 mmHg. Tricuspid Valve: The tricuspid valve is normal in structure. Tricuspid valve regurgitation is mild. Aortic Valve: The aortic valve is grossly normal. Aortic valve regurgitation is not visualized. Mild aortic stenosis is present. Aortic valve mean gradient measures 6.0 mmHg. Aortic valve peak gradient measures 10.2 mmHg. Aortic valve area, by VTI measures 1.42 cm. Pulmonic Valve: The pulmonic valve was normal in structure. Pulmonic valve regurgitation is not visualized. Aorta: The ascending aorta was not well visualized. IAS/Shunts: No atrial level shunt detected by color flow Doppler. Additional Comments: 3D was performed not requiring image post processing on an independent workstation and was indeterminate.  LEFT VENTRICLE PLAX 2D LVIDd:         3.60 cm   Diastology LVIDs:         2.60 cm   LV e' medial:    6.31 cm/s LV PW:         1.20 cm   LV E/e' medial:  8.6 LV IVS:        0.90 cm   LV e' lateral:   5.66 cm/s LVOT diam:     2.00 cm   LV E/e' lateral: 9.5 LV SV:         40 LV SV Index:   27 LVOT Area:     3.14 cm LV IVRT:       92 msec  RIGHT VENTRICLE RV Basal diam:  2.50 cm     PULMONARY VEINS RV Mid diam:    1.70 cm     Diastolic Velocity: 43.30 cm/s RV S prime:     12.60 cm/s  S/D Velocity:       1.20 TAPSE (M-mode): 1.2 cm      Systolic Velocity:  52.30 cm/s LEFT ATRIUM             Index        RIGHT ATRIUM          Index LA diam:        2.90 cm 1.96 cm/m   RA Area:     6.29 cm LA Vol (A2C):   22.6 ml 15.26 ml/m  RA Volume:   8.56 ml  5.78 ml/m LA Vol (A4C):   19.3 ml 13.04 ml/m LA Biplane Vol: 21.1 ml 14.25 ml/m  AORTIC VALVE AV Area (Vmax):    1.54  cm AV Area (Vmean):   1.45 cm AV Area (VTI):     1.42 cm AV Vmax:           160.00 cm/s AV Vmean:          114.333 cm/s AV VTI:            0.282 m AV Peak Grad:      10.2 mmHg AV Mean Grad:      6.0 mmHg LVOT Vmax:         78.30 cm/s LVOT Vmean:        52.800 cm/s LVOT VTI:          0.128 m LVOT/AV VTI ratio: 0.45  AORTA Ao Root diam: 2.40 cm MITRAL VALVE  TRICUSPID VALVE MV Area (PHT): 3.19 cm    TR Peak grad:   26.6 mmHg MV Area VTI:   2.34 cm    TR Vmax:        258.00 cm/s MV Peak grad:  2.8 mmHg MV Mean grad:  1.0 mmHg    SHUNTS MV Vmax:       0.83 m/s    Systemic VTI:  0.13 m MV Vmean:      50.3 cm/s   Systemic Diam: 2.00 cm MV Decel Time: 238 msec MV E velocity: 54.00 cm/s MV A velocity: 90.40 cm/s MV E/A ratio:  0.60 Dwayne D Callwood MD Electronically signed by Cara JONETTA Lovelace MD Signature Date/Time: 04/12/2024/12:14:50 PM    Final    US  Venous Img Lower Bilateral (DVT) Result Date: 04/12/2024 EXAM: ULTRASOUND DUPLEX OF THE BILATERAL LOWER EXTREMITY VEINS TECHNIQUE: Duplex ultrasound using B-mode/gray scaled imaging and Doppler spectral analysis and color flow was obtained of the deep venous structures of the bilateral lower extremity. COMPARISON: None available. CLINICAL HISTORY: Swelling. FINDINGS: LEFT: The common femoral vein, femoral vein, popliteal vein, and posterior tibial vein of the left lower extremity demonstrate normal compressibility with normal color flow and spectral analysis. RIGHT: The common femoral vein, femoral vein, popliteal vein, and posterior tibial vein of the right lower extremity demonstrate normal compressibility with normal color flow and spectral analysis. IMPRESSION: 1. No evidence of lower extremity deep venous thrombosis to explain the swelling. Electronically signed by: Katheleen Faes MD 04/12/2024 09:21 AM EST RP Workstation: [id number]   CT Head Wo Contrast Result Date: 04/11/2024 EXAM: CT HEAD WITHOUT CONTRAST 04/11/2024 01:33:32 PM TECHNIQUE: CT  of the head was performed without the administration of intravenous contrast. Automated exposure control, iterative reconstruction, and/or weight based adjustment of the mA/kV was utilized to reduce the radiation dose to as low as reasonably achievable. COMPARISON: MRI of the head dated 11/22/2023. CLINICAL HISTORY: Head trauma, GCS=15, no focal neuro findings (low risk) (Ped 0-17y) FINDINGS: BRAIN AND VENTRICLES: No acute hemorrhage. No evidence of acute infarct. No hydrocephalus. No extra-axial collection. No mass effect or midline shift. Mild periventricular white matter disease. ORBITS: No acute abnormality. Status post bilateral lens replacement. SINUSES: No acute abnormality. SOFT TISSUES AND SKULL: No acute soft tissue abnormality. No skull fracture. Moderate vascular calcifications. IMPRESSION: 1. No acute intracranial abnormality. 2. Mild periventricular white matter disease. Electronically signed by: Evalene Coho MD 04/11/2024 02:03 PM EST RP Workstation: [id number]   CT Cervical Spine Wo Contrast Result Date: 04/11/2024 CLINICAL DATA:  Neck trauma (Age >= 65y) Three recent falls. EXAM: CT CERVICAL SPINE WITHOUT CONTRAST TECHNIQUE: Multidetector CT imaging of the cervical spine was performed without intravenous contrast. Multiplanar CT image reconstructions were also generated. RADIATION DOSE REDUCTION: This exam was performed according to the departmental dose-optimization program which includes automated exposure control, adjustment of the mA and/or kV according to patient size and/or use of iterative reconstruction technique. COMPARISON:  None Available. FINDINGS: Alignment: Convex left scoliosis. 3 mm of anterolisthesis at C3-4 and C4-5 which appears facet-mediated. Skull base and vertebrae: No evidence of acute fracture or traumatic subluxation. Soft tissues and spinal canal: No prevertebral fluid or swelling. No visible canal hematoma. Disc levels: Multilevel spondylosis. There is severe disc  space narrowing and uncinate spurring at C4-5, C5-6 and C6-7. There is advanced asymmetric right-sided facet hypertrophy at C3-4 and C4-5. Mild multilevel foraminal narrowing appears greatest on the left at C5-6. No large disc herniation or high-grade spinal stenosis demonstrated. Upper chest: Mild  emphysematous changes at both lung apices. Other: Bilateral carotid atherosclerosis. IMPRESSION: 1. No evidence of acute cervical spine fracture, traumatic subluxation or static signs of instability. 2. Multilevel cervical spondylosis as described. Electronically Signed   By: Elsie Perone M.D.   On: 04/11/2024 13:59    Microbiology: Results for orders placed or performed during the hospital encounter of 01/03/24  Resp panel by RT-PCR (RSV, Flu A&B, Covid) Anterior Nasal Swab     Status: None   Collection Time: 01/03/24  1:17 AM   Specimen: Anterior Nasal Swab  Result Value Ref Range Status   SARS Coronavirus 2 by RT PCR NEGATIVE NEGATIVE Final    Comment: (NOTE) SARS-CoV-2 target nucleic acids are NOT DETECTED.  The SARS-CoV-2 RNA is generally detectable in upper respiratory specimens during the acute phase of infection. The lowest concentration of SARS-CoV-2 viral copies this assay can detect is 138 copies/mL. A negative result does not preclude SARS-Cov-2 infection and should not be used as the sole basis for treatment or other patient management decisions. A negative result may occur with  improper specimen collection/handling, submission of specimen other than nasopharyngeal swab, presence of viral mutation(s) within the areas targeted by this assay, and inadequate number of viral copies(<138 copies/mL). A negative result must be combined with clinical observations, patient history, and epidemiological information. The expected result is Negative.  Fact Sheet for Patients:  bloggercourse.com  Fact Sheet for Healthcare Providers:   seriousbroker.it  This test is no t yet approved or cleared by the [place]  FDA and  has been authorized for detection and/or diagnosis of SARS-CoV-2 by FDA under an Emergency Use Authorization (EUA). This EUA will remain  in effect (meaning this test can be used) for the duration of the COVID-19 declaration under Section 564(b)(1) of the Act, 21 U.S.C.section 360bbb-3(b)(1), unless the authorization is terminated  or revoked sooner.       Influenza A by PCR NEGATIVE NEGATIVE Final   Influenza B by PCR NEGATIVE NEGATIVE Final    Comment: (NOTE) The Xpert Xpress SARS-CoV-2/FLU/RSV plus assay is intended as an aid in the diagnosis of influenza from Nasopharyngeal swab specimens and should not be used as a sole basis for treatment. Nasal washings and aspirates are unacceptable for Xpert Xpress SARS-CoV-2/FLU/RSV testing.  Fact Sheet for Patients: bloggercourse.com  Fact Sheet for Healthcare Providers: seriousbroker.it  This test is not yet approved or cleared by the [place]  FDA and has been authorized for detection and/or diagnosis of SARS-CoV-2 by FDA under an Emergency Use Authorization (EUA). This EUA will remain in effect (meaning this test can be used) for the duration of the COVID-19 declaration under Section 564(b)(1) of the Act, 21 U.S.C. section 360bbb-3(b)(1), unless the authorization is terminated or revoked.     Resp Syncytial Virus by PCR NEGATIVE NEGATIVE Final    Comment: (NOTE) Fact Sheet for Patients: bloggercourse.com  Fact Sheet for Healthcare Providers: seriousbroker.it  This test is not yet approved or cleared by the [place]  FDA and has been authorized for detection and/or diagnosis of SARS-CoV-2 by FDA under an Emergency Use Authorization (EUA). This EUA will remain in effect (meaning this test can be used) for  the duration of the COVID-19 declaration under Section 564(b)(1) of the Act, 21 U.S.C. section 360bbb-3(b)(1), unless the authorization is terminated or revoked.  Performed at [facility], [address] [place], [place] [address]   Gastrointestinal Panel by PCR , Stool     Status: None   Collection Time: 01/03/24  4:36 AM   Specimen: Stool  Result Value Ref Range Status   Campylobacter species NOT DETECTED NOT DETECTED Final   Plesimonas shigelloides NOT DETECTED NOT DETECTED Final   Salmonella species NOT DETECTED NOT DETECTED Final   Yersinia enterocolitica NOT DETECTED NOT DETECTED Final   Vibrio species NOT DETECTED NOT DETECTED Final   Vibrio cholerae NOT DETECTED NOT DETECTED Final   Enteroaggregative E coli (EAEC) NOT DETECTED NOT DETECTED Final   Enteropathogenic E coli (EPEC) NOT DETECTED NOT DETECTED Final   Enterotoxigenic E coli (ETEC) NOT DETECTED NOT DETECTED Final   Shiga like toxin producing E coli (STEC) NOT DETECTED NOT DETECTED Final   Shigella/Enteroinvasive E coli (EIEC) NOT DETECTED NOT DETECTED Final   Cryptosporidium NOT DETECTED NOT DETECTED Final   Cyclospora cayetanensis NOT DETECTED NOT DETECTED Final   Entamoeba histolytica NOT DETECTED NOT DETECTED Final   Giardia lamblia NOT DETECTED NOT DETECTED Final   Adenovirus F40/41 NOT DETECTED NOT DETECTED Final   Astrovirus NOT DETECTED NOT DETECTED Final   Norovirus GI/GII NOT DETECTED NOT DETECTED Final   Rotavirus A NOT DETECTED NOT DETECTED Final   Sapovirus (I, II, IV, and V) NOT DETECTED NOT DETECTED Final    Comment: Performed at [facility], [address] [place], [place] [address]  C Difficile Quick Screen w PCR reflex     Status: None   Collection Time: 01/03/24  4:36 AM   Specimen: Stool  Result Value Ref Range Status   C Diff antigen NEGATIVE NEGATIVE Final   C Diff toxin NEGATIVE NEGATIVE Final   C Diff interpretation No C. difficile detected.  Final    Comment:  Performed at [facility], [address] [place], [place] [address]    Labs: CBC: Recent Labs  Lab 04/11/24 1315 04/12/24 0635  WBC 7.5 6.2  HGB 9.9* 9.6*  HCT 32.8* 30.9*  MCV 104.5* 102.7*  PLT 318 280   Basic Metabolic Panel: Recent Labs  Lab 04/11/24 1315 04/12/24 0635  NA 141 142  K 4.5 4.7  CL 108 106  CO2 20* 23  GLUCOSE 124* 81  BUN 47* 44*  CREATININE 3.57* 3.37*  CALCIUM 9.3 9.3  MG 2.0  --    Liver Function Tests: Recent Labs  Lab 04/11/24 1315  AST 18  ALT 17  ALKPHOS 39  BILITOT <0.2  PROT 5.9*  ALBUMIN 3.8   CBG: Recent Labs  Lab 04/11/24 1644  GLUCAP 69*    Discharge time spent: greater than 30 minutes.  Signed: Leita Blanch, MD [facility] 04/12/2024

## 2024-04-12 NOTE — Consult Note (Signed)
 [facility] CARDIOLOGY CONSULT NOTE       Patient ID: [name] MRN: [id number] DOB/AGE: 76/01/1946 76 y.o.  Admit date: 04/11/2024 Referring Physician Dr. Morene Bathe Primary Physician Paraschos, Marsa, MD  Primary Cardiologist Dr. Ammon Reason for Consultation syncope  HPI: [name] is a 78 y.o. female  with a past medical history of coronary artery disease s/p CABG and multiple stents, hypertension, hyperlipidemia, CKD stage V, peripheral vascular disease who presented to the ED on 04/11/2024 for syncope. Cardiology was consulted for further evaluation.   Patient presented to the ED for further evaluation of multiple syncopal episodes recently.  Workup in the ED notable for creatinine 3.57, potassium 4.5, hemoglobin 9.9, WBC 7.5. Troponins 45. EKG in the ED NSR rate 77 bpm. CT head without acute abnormality, LE US  with no evidence of DVT.  At the time of my evaluation this AM, she is resting in hospital bed. Feeling well overall today, has any chest pain, shortness of breath, palpitation symptoms.  We discussed her presentation in further detail.  She states that she had an initial episode of syncope about 2 weeks ago, then another episode about a week and a half ago, and 1 more episode on Thanksgiving.  She states that overall she does not have any prodrome of symptoms prior to this but recently reports that she has had some lightheadedness with standing over the last few weeks.  Denies any chest pain, shortness of breath, palpitation symptoms at any time recently.  Denies any exertional issues.  States that she has never had any symptoms like this before.  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   CARDIAC CATHETERIZATION N/A 10/21/2015   Procedure: Left Heart Cath and Coronary Angiography;  Surgeon: Marsa Ammon, MD;  Location: [facility];  Service: Cardiovascular;  Laterality: N/A;   CARDIAC CATHETERIZATION N/A 10/21/2015   Procedure: Coronary Stent Intervention;  Surgeon: Marsa Ammon, MD;  Location: [facility];  Service: Cardiovascular;  Laterality: N/A;   CARDIAC CATHETERIZATION Left 11/24/2015   Procedure: Coronary Stent Intervention;  Surgeon: Marsa Ammon, MD;  Location: [facility];  Service: Cardiovascular;  Laterality: Left;   CARDIAC SURGERY  2007   cardiac stent place   CATARACT EXTRACTION  2012   CHOLECYSTECTOMY     CORONARY ANGIOPLASTY WITH STENT PLACEMENT  2013   CORONARY ARTERY BYPASS GRAFT  2008   CORONARY STENT INTERVENTION N/A 07/04/2017   Procedure: CORONARY STENT INTERVENTION;  Surgeon: Ammon Marsa, MD;  Location: [facility];  Service: Cardiovascular;  Laterality: N/A;   CORONARY STENT INTERVENTION N/A 08/08/2017   Procedure: CORONARY STENT INTERVENTION;  Surgeon: Ammon Marsa, MD;  Location: [facility];  Service:  Cardiovascular;  Laterality: N/A;   CYSTOSCOPY W/ RETROGRADES Bilateral 12/29/2014   Procedure: CYSTOSCOPY WITH RETROGRADE PYELOGRAM;  Surgeon: Charlie JONETTA Pack, MD;  Location: [facility];  Service: Urology;  Laterality: Bilateral;   CYSTOSCOPY WITH BIOPSY N/A 12/29/2014   Procedure: CYSTOSCOPY WITH BIOPSY;  Surgeon: Charlie JONETTA Pack, MD;  Location: [facility];  Service: Urology;  Laterality: N/A;   HAND SURGERY  1998   LEFT HEART CATH AND CORONARY  ANGIOGRAPHY Left 08/08/2017   Procedure: LEFT HEART CATH AND CORONARY ANGIOGRAPHY;  Surgeon: Ammon Blunt, MD;  Location: [facility];  Service: Cardiovascular;  Laterality: Left;   LEFT HEART CATH AND CORONARY ANGIOGRAPHY Left 08/01/2019   Procedure: LEFT HEART CATH AND CORONARY ANGIOGRAPHY;  Surgeon: Ammon Blunt, MD;  Location: [facility];  Service: Cardiovascular;  Laterality: Left;   LEFT HEART CATH AND CORS/GRAFTS ANGIOGRAPHY N/A 07/04/2017   Procedure: LEFT HEART CATH AND CORS/GRAFTS ANGIOGRAPHY;  Surgeon: Ammon Blunt, MD;  Location: [facility];  Service: Cardiovascular;  Laterality: N/A;   SIGMOID RESECTION / RECTOPEXY  2006   SKIN CANCER EXCISION  2013   TENDON REPAIR     right elbow   TRIGGER FINGER RELEASE      Medications Prior to Admission  Medication Sig Dispense Refill Last Dose/Taking   acetaminophen  (TYLENOL ) 325 MG tablet Take 650 mg by mouth every 6 (six) hours as needed (for pain.).      allopurinol  (ZYLOPRIM ) 100 MG tablet Take 100 mg by mouth daily with lunch.       aspirin  325 MG tablet Take 325 mg by mouth daily with lunch.       AZO-CRANBERRY PO Take 1 capsule by mouth daily with lunch.  (Patient not taking: Reported on 03/11/2024)      calcitRIOL (ROCALTROL) 0.25 MCG capsule 1 capsule once daily      calcium acetate (PHOSLO) 667 MG tablet Take by mouth.      CINNAMON PO Take 1,000 mg by mouth.      clindamycin (CLEOCIN) 300 MG capsule Take 300 mg by mouth.      clopidogrel  (PLAVIX ) 75 MG tablet Take 75 mg by mouth daily with lunch.       co-enzyme Q-10 30 MG capsule Take 30 mg by mouth daily. (Patient not taking: Reported on 03/11/2024)      ergocalciferol (VITAMIN D2) 1.25 MG (50000 UT) capsule Take by mouth. (Patient not taking: Reported on 03/11/2024)      furosemide  (LASIX ) 40 MG tablet Take 20 mg by mouth once a week. Every other day with lunch      gabapentin (NEURONTIN) 100 MG capsule Take 100 mg by mouth at  bedtime.       Garlic 1000 MG CAPS Take by mouth.      hydrOXYzine (ATARAX) 25 MG tablet TAKE 1 TABLET (25 MG TOTAL) BY MOUTH ONCE DAILY AS NEEDED FOR ITCHING FOR UP TO 90 DAYS      hyoscyamine (LEVSIN SL) 0.125 MG SL tablet Take 0.125 mg by mouth daily. (Patient not taking: Reported on 03/11/2024)      isosorbide  mononitrate (IMDUR ) 60 MG 24 hr tablet Take 60 mg by mouth daily.       Magnesium Citrate 100 MG TABS Take 1 tablet by mouth daily.      MAGNESIUM CITRATE PO Take 100 mg by mouth daily.       metoprolol  succinate (TOPROL -XL) 25 MG 24 hr tablet Take 25 mg by mouth daily with lunch.  1/2 tab QD      nitroGLYCERIN  (NITROSTAT ) 0.4 MG SL tablet Place 0.4 mg under the tongue as needed for chest pain.      ondansetron  (ZOFRAN -ODT) 4 MG disintegrating tablet Take 4 mg by mouth 3 (three) times daily as needed.      pantoprazole  (PROTONIX ) 40 MG tablet Take 40 mg by mouth daily before breakfast.       potassium chloride  (KLOR-CON  M) 10 MEQ tablet Take by mouth.      potassium chloride  (MICRO-K ) 10 MEQ CR capsule Take 20 mEq by mouth in the morning and at bedtime.       promethazine  (PHENERGAN ) 25 MG tablet Take 25 mg by mouth every 8 (eight) hours as needed for nausea or vomiting.       ranolazine (RANEXA) 500 MG 12 hr tablet Take by mouth.      riboflavin (VITAMIN B-2) 100 MG TABS tablet Take 100 mg by mouth daily with lunch.  (Patient not taking: Reported on 03/11/2024)      senna (SENOKOT) 8.6 MG tablet Take 4-6 tablets by mouth at bedtime.       sodium bicarbonate 650 MG tablet Take 650 mg by mouth daily with lunch.      vitamin E 400 UNIT capsule Take 400 Units by mouth daily with lunch.       Social History   Socioeconomic History   Marital status: Married    Spouse name: Not on file   Number of children: Not on file   Years of education: Not on file   Highest education level: Not on file  Occupational History   Not on file  Tobacco Use   Smoking status: Never    Passive exposure:  Never   Smokeless tobacco: Never  Vaping Use   Vaping status: Never Used  Substance and Sexual Activity   Alcohol use: No   Drug use: No   Sexual activity: Yes    Birth control/protection: Surgical  Other Topics Concern   Not on file  Social History Narrative   Not on file   Social Drivers of Health   Financial Resource Strain: Low Risk  (10/17/2023)   Received from [facility]   Overall Financial Resource Strain (CARDIA)    Difficulty of Paying Living Expenses: Not hard at all  Food Insecurity: Patient Declined (04/11/2024)   Hunger Vital Sign    Worried About Running Out of Food in the Last Year: Patient declined    Ran Out of Food in the Last Year: Patient declined  Transportation Needs: Patient Declined (04/11/2024)   PRAPARE - Administrator, Civil Service (Medical): Patient declined    Lack of Transportation (Non-Medical): Patient declined  Physical Activity: Not on file  Stress: Not on file  Social Connections: Patient Declined (04/11/2024)   Social Connection and Isolation Panel    Frequency of Communication with Friends and Family: Patient declined    Frequency of Social Gatherings with Friends and Family: Patient declined    Attends Religious Services: Patient declined    Database Administrator or Organizations: Patient declined    Attends Banker Meetings: Patient declined    Marital Status: Patient declined  Intimate Partner Violence: Patient Declined (04/11/2024)   Humiliation, Afraid, Rape, and Kick questionnaire    Fear of Current or Ex-Partner: Patient declined    Emotionally Abused: Patient declined    Physically Abused: Patient declined    Sexually Abused: Patient declined  Family History  Problem Relation Age of Onset   Kidney Stones Father    Prostate cancer Father    Heart disease Father    Kidney Stones Brother    Heart disease Mother      Vitals:   04/11/24 2342 04/12/24 0337 04/12/24 0500 04/12/24  0938  BP: (!) 112/53 101/61  (!) 160/75  Pulse: 79 75  86  Resp: 17 16  19   Temp: 98 F (36.7 C) 97.9 F (36.6 C)  98.2 F (36.8 C)  TempSrc: Oral Oral  Oral  SpO2: 95% 97%  98%  Weight:   48.4 kg   Height:        PHYSICAL EXAM General: Chronically ill-appearing elderly female, well nourished, in no acute distress. HEENT: Normocephalic and atraumatic. Neck: No JVD.  Lungs: Normal respiratory effort on room air. Clear bilaterally to auscultation. No wheezes, crackles, rhonchi.  Heart: HRRR. Normal S1 and S2 without gallops or murmurs.  Abdomen: Non-distended appearing.  Msk: Normal strength and tone for age. Extremities: Warm and well perfused. No clubbing, cyanosis.  No edema.  Neuro: Alert and oriented X 3. Psych: Answers questions appropriately.   Labs: Basic Metabolic Panel: Recent Labs    04/11/24 1315 04/12/24 0635  NA 141 142  K 4.5 4.7  CL 108 106  CO2 20* 23  GLUCOSE 124* 81  BUN 47* 44*  CREATININE 3.57* 3.37*  CALCIUM 9.3 9.3  MG 2.0  --    Liver Function Tests: Recent Labs    04/11/24 1315  AST 18  ALT 17  ALKPHOS 39  BILITOT <0.2  PROT 5.9*  ALBUMIN 3.8   No results for input(s): LIPASE, AMYLASE in the last 72 hours. CBC: Recent Labs    04/11/24 1315 04/12/24 0635  WBC 7.5 6.2  HGB 9.9* 9.6*  HCT 32.8* 30.9*  MCV 104.5* 102.7*  PLT 318 280   Cardiac Enzymes: No results for input(s): CKTOTAL, CKMB, CKMBINDEX, TROPONINIHS in the last 72 hours. BNP: No results for input(s): BNP in the last 72 hours. D-Dimer: No results for input(s): DDIMER in the last 72 hours. Hemoglobin A1C: No results for input(s): HGBA1C in the last 72 hours. Fasting Lipid Panel: No results for input(s): CHOL, HDL, LDLCALC, TRIG, CHOLHDL, LDLDIRECT in the last 72 hours. Thyroid Function Tests: Recent Labs    04/11/24 1315  TSH 2.200   Anemia Panel: No results for input(s): VITAMINB12, FOLATE, FERRITIN, TIBC, IRON ,  RETICCTPCT in the last 72 hours.   Radiology: US  Venous Img Lower Bilateral (DVT) Result Date: 04/12/2024 EXAM: ULTRASOUND DUPLEX OF THE BILATERAL LOWER EXTREMITY VEINS TECHNIQUE: Duplex ultrasound using B-mode/gray scaled imaging and Doppler spectral analysis and color flow was obtained of the deep venous structures of the bilateral lower extremity. COMPARISON: None available. CLINICAL HISTORY: Swelling. FINDINGS: LEFT: The common femoral vein, femoral vein, popliteal vein, and posterior tibial vein of the left lower extremity demonstrate normal compressibility with normal color flow and spectral analysis. RIGHT: The common femoral vein, femoral vein, popliteal vein, and posterior tibial vein of the right lower extremity demonstrate normal compressibility with normal color flow and spectral analysis. IMPRESSION: 1. No evidence of lower extremity deep venous thrombosis to explain the swelling. Electronically signed by: Katheleen Faes MD 04/12/2024 09:21 AM EST RP Workstation: [id number]   CT Head Wo Contrast Result Date: 04/11/2024 EXAM: CT HEAD WITHOUT CONTRAST 04/11/2024 01:33:32 PM TECHNIQUE: CT of the head was performed without the administration of intravenous contrast. Automated exposure control, iterative reconstruction, and/or weight  based adjustment of the mA/kV was utilized to reduce the radiation dose to as low as reasonably achievable. COMPARISON: MRI of the head dated 11/22/2023. CLINICAL HISTORY: Head trauma, GCS=15, no focal neuro findings (low risk) (Ped 0-17y) FINDINGS: BRAIN AND VENTRICLES: No acute hemorrhage. No evidence of acute infarct. No hydrocephalus. No extra-axial collection. No mass effect or midline shift. Mild periventricular white matter disease. ORBITS: No acute abnormality. Status post bilateral lens replacement. SINUSES: No acute abnormality. SOFT TISSUES AND SKULL: No acute soft tissue abnormality. No skull fracture. Moderate vascular calcifications. IMPRESSION: 1. No acute  intracranial abnormality. 2. Mild periventricular white matter disease. Electronically signed by: Evalene Coho MD 04/11/2024 02:03 PM EST RP Workstation: [id number]   CT Cervical Spine Wo Contrast Result Date: 04/11/2024 CLINICAL DATA:  Neck trauma (Age >= 65y) Three recent falls. EXAM: CT CERVICAL SPINE WITHOUT CONTRAST TECHNIQUE: Multidetector CT imaging of the cervical spine was performed without intravenous contrast. Multiplanar CT image reconstructions were also generated. RADIATION DOSE REDUCTION: This exam was performed according to the departmental dose-optimization program which includes automated exposure control, adjustment of the mA and/or kV according to patient size and/or use of iterative reconstruction technique. COMPARISON:  None Available. FINDINGS: Alignment: Convex left scoliosis. 3 mm of anterolisthesis at C3-4 and C4-5 which appears facet-mediated. Skull base and vertebrae: No evidence of acute fracture or traumatic subluxation. Soft tissues and spinal canal: No prevertebral fluid or swelling. No visible canal hematoma. Disc levels: Multilevel spondylosis. There is severe disc space narrowing and uncinate spurring at C4-5, C5-6 and C6-7. There is advanced asymmetric right-sided facet hypertrophy at C3-4 and C4-5. Mild multilevel foraminal narrowing appears greatest on the left at C5-6. No large disc herniation or high-grade spinal stenosis demonstrated. Upper chest: Mild emphysematous changes at both lung apices. Other: Bilateral carotid atherosclerosis. IMPRESSION: 1. No evidence of acute cervical spine fracture, traumatic subluxation or static signs of instability. 2. Multilevel cervical spondylosis as described. Electronically Signed   By: Elsie Perone M.D.   On: 04/11/2024 13:59    ECHO pending review  TELEMETRY (personally reviewed): NSR rate 70s, no events  EKG (personally reviewed): NSR rate 77 bpm  Data reviewed by me 04/12/2024: last 24h vitals tele labs imaging I/O  ED provider note, admission H&P  Principal Problem:   Syncope and collapse Active Problems:   Gastro-esophageal reflux disease without esophagitis   Hyperlipidemia   CAD (coronary artery disease)   Coronary atherosclerosis of autologous vein bypass graft   Hypertension   CKD (chronic kidney disease) stage 4, GFR 15-29 ml/min (HCC)    ASSESSMENT AND PLAN:  [name] is a 78 y.o. female  with a past medical history of coronary artery disease s/p CABG and multiple stents, hypertension, hyperlipidemia, CKD stage V, peripheral vascular disease who presented to the ED on 04/11/2024 for syncope. Cardiology was consulted for further evaluation.   # Syncope # Coronary artery disease s/p CABG # Hypertension # Chronic kidney disease stage V Patient presented with multiple recent syncopal episodes.  No prodrome of symptoms but does endorse some recent lightheadedness.  No chest pain, shortness of breath, palpitations.  Mildly elevated troponin.  EKG without acute ischemic changes.  Telemetry without any events thus far. - Echocardiogram done, pending review.  Further recommendations pending these results. - Would recommend 14-day Holter monitor on discharge for additional evaluation, reassuringly telemetry has been unrevealing thus far. - Per home med list it looks like she was taking full dose aspirin  and Plavix .  Will cut aspirin   to 81 mg daily and continue Plavix  75 mg daily. - Resume home Imdur  60 mg twice daily, metoprolol  succinate 25 mg daily. - Minimal troponin most consistent with demand/supply mismatch and not ACS .  If echo is unrevealing then can be discharged today from cardiac perspective. Will have staff place holter monitor this afternoon, follow up appointment scheduled for 05/06/24.   This patient's plan of care was discussed and created with Dr. Florencio and he is in agreement.  Signed: Danita Bloch, PA-C  04/12/2024, 11:10 AM [facility]

## 2024-04-13 LAB — URINE CULTURE: Culture: >=100000 — AB

## 2024-04-15 ENCOUNTER — Other Ambulatory Visit: Payer: Self-pay

## 2024-04-15 DIAGNOSIS — D631 Anemia in chronic kidney disease: Secondary | ICD-10-CM

## 2024-04-15 DIAGNOSIS — D508 Other iron deficiency anemias: Secondary | ICD-10-CM

## 2024-04-16 ENCOUNTER — Ambulatory Visit

## 2024-04-16 ENCOUNTER — Inpatient Hospital Stay: Attending: Oncology

## 2024-04-16 ENCOUNTER — Inpatient Hospital Stay

## 2024-04-16 VITALS — BP 103/62

## 2024-04-16 DIAGNOSIS — N184 Chronic kidney disease, stage 4 (severe): Secondary | ICD-10-CM | POA: Insufficient documentation

## 2024-04-16 DIAGNOSIS — D631 Anemia in chronic kidney disease: Secondary | ICD-10-CM | POA: Insufficient documentation

## 2024-04-16 LAB — CBC WITH DIFFERENTIAL (CANCER CENTER ONLY)
Abs Immature Granulocytes: 0.09 K/uL — ABNORMAL HIGH (ref 0.00–0.07)
Basophils Absolute: 0.1 K/uL (ref 0.0–0.1)
Basophils Relative: 1 %
Eosinophils Absolute: 0.3 K/uL (ref 0.0–0.5)
Eosinophils Relative: 4 %
HCT: 29.5 % — ABNORMAL LOW (ref 36.0–46.0)
Hemoglobin: 9.2 g/dL — ABNORMAL LOW (ref 12.0–15.0)
Immature Granulocytes: 1 %
Lymphocytes Relative: 19 %
Lymphs Abs: 1.4 K/uL (ref 0.7–4.0)
MCH: 31.8 pg (ref 26.0–34.0)
MCHC: 31.2 g/dL (ref 30.0–36.0)
MCV: 102.1 fL — ABNORMAL HIGH (ref 80.0–100.0)
Monocytes Absolute: 0.8 K/uL (ref 0.1–1.0)
Monocytes Relative: 10 %
Neutro Abs: 4.7 K/uL (ref 1.7–7.7)
Neutrophils Relative %: 65 %
Platelet Count: 284 K/uL (ref 150–400)
RBC: 2.89 MIL/uL — ABNORMAL LOW (ref 3.87–5.11)
RDW: 15 % (ref 11.5–15.5)
WBC Count: 7.4 K/uL (ref 4.0–10.5)
nRBC: 0 % (ref 0.0–0.2)

## 2024-04-16 MED ORDER — DARBEPOETIN ALFA 200 MCG/0.4ML IJ SOSY
200.0000 ug | PREFILLED_SYRINGE | Freq: Once | INTRAMUSCULAR | Status: AC
  Administered 2024-04-16: 200 ug via SUBCUTANEOUS
  Filled 2024-04-16: qty 0.4

## 2024-04-18 ENCOUNTER — Ambulatory Visit

## 2024-04-18 DIAGNOSIS — R262 Difficulty in walking, not elsewhere classified: Secondary | ICD-10-CM

## 2024-04-18 DIAGNOSIS — M6281 Muscle weakness (generalized): Secondary | ICD-10-CM | POA: Diagnosis not present

## 2024-04-18 DIAGNOSIS — R296 Repeated falls: Secondary | ICD-10-CM

## 2024-04-18 DIAGNOSIS — R2689 Other abnormalities of gait and mobility: Secondary | ICD-10-CM

## 2024-04-18 NOTE — Therapy (Signed)
 OUTPATIENT PHYSICAL THERAPY TREATMENT/RE-EVAL    Patient Name: [name] MRN: [id number] DOB:09/23/1945, 78 y.o., female Today's Date: 04/18/2024   PCP: Sadie Manna, MD  REFERRING PROVIDER: Maree Jannett MARLA, MD  END OF SESSION:  PT End of Session - 04/18/24 1102     Visit Number 4    Number of Visits 28    Date for Recertification  07/11/24    Progress Note Due on Visit 10    PT Start Time 1101    PT Stop Time 1144    PT Time Calculation (min) 43 min    Equipment Utilized During Treatment Gait belt    Activity Tolerance Patient tolerated treatment well    Behavior During Therapy WFL for tasks assessed/performed          Past Medical History:  Diagnosis Date   Acid reflux    Anemia    CAD (coronary artery disease)    Chronic constipation    Chronic kidney disease    stage 3 chronic kidney disease   Eczema    H/O heart artery stent    Hyperlipidemia    Hypertension    IBS (irritable bowel syndrome) 2004   Migraine    Mitral valve prolapse    Peptic ulcer 1989   Skin cancer    Past Surgical History:  Procedure Laterality Date   ABDOMINAL HYSTERECTOMY     APPENDECTOMY     BREAST BIOPSY Left 07/14/2021   left stereo bx ribbon clip path stromal fibrosis   CARDIAC CATHETERIZATION N/A 10/21/2015   Procedure: Left Heart Cath and Coronary Angiography;  Surgeon: Marsa Dooms, MD;  Location: [facility];  Service: Cardiovascular;  Laterality: N/A;   CARDIAC CATHETERIZATION N/A 10/21/2015   Procedure: Coronary Stent Intervention;  Surgeon: Marsa Dooms, MD;  Location: [facility];  Service: Cardiovascular;  Laterality: N/A;   CARDIAC CATHETERIZATION Left 11/24/2015   Procedure: Coronary Stent Intervention;  Surgeon: Marsa Dooms, MD;  Location: [facility];  Service: Cardiovascular;  Laterality: Left;   CARDIAC SURGERY  2007   cardiac stent place   CATARACT EXTRACTION  2012   CHOLECYSTECTOMY     CORONARY  ANGIOPLASTY WITH STENT PLACEMENT  2013   CORONARY ARTERY BYPASS GRAFT  2008   CORONARY STENT INTERVENTION N/A 07/04/2017   Procedure: CORONARY STENT INTERVENTION;  Surgeon: Dooms Marsa, MD;  Location: [facility];  Service: Cardiovascular;  Laterality: N/A;   CORONARY STENT INTERVENTION N/A 08/08/2017   Procedure: CORONARY STENT INTERVENTION;  Surgeon: Dooms Marsa, MD;  Location: [facility];  Service: Cardiovascular;  Laterality: N/A;   CYSTOSCOPY W/ RETROGRADES Bilateral 12/29/2014   Procedure: CYSTOSCOPY WITH RETROGRADE PYELOGRAM;  Surgeon: Charlie JONETTA Pack, MD;  Location: [facility];  Service: Urology;  Laterality: Bilateral;   CYSTOSCOPY WITH BIOPSY N/A 12/29/2014   Procedure: CYSTOSCOPY WITH BIOPSY;  Surgeon: Charlie JONETTA Pack, MD;  Location: [facility];  Service: Urology;  Laterality: N/A;   HAND SURGERY  1998   LEFT HEART CATH AND CORONARY ANGIOGRAPHY Left 08/08/2017   Procedure: LEFT HEART CATH AND CORONARY ANGIOGRAPHY;  Surgeon: Dooms Marsa, MD;  Location: [facility];  Service: Cardiovascular;  Laterality: Left;   LEFT HEART CATH AND CORONARY ANGIOGRAPHY Left 08/01/2019   Procedure: LEFT HEART CATH AND CORONARY ANGIOGRAPHY;  Surgeon: Dooms Marsa, MD;  Location: [facility];  Service: Cardiovascular;  Laterality: Left;   LEFT HEART CATH AND CORS/GRAFTS ANGIOGRAPHY N/A 07/04/2017   Procedure: LEFT  HEART CATH AND CORS/GRAFTS ANGIOGRAPHY;  Surgeon: Ammon Blunt, MD;  Location: [facility];  Service: Cardiovascular;  Laterality: N/A;   SIGMOID RESECTION / RECTOPEXY  2006   SKIN CANCER EXCISION  2013   TENDON REPAIR     right elbow   TRIGGER FINGER RELEASE     Patient Active Problem List   Diagnosis Date Noted   Syncope and collapse 04/11/2024   Age-related osteoporosis without current pathological fracture 09/20/2023   Chronic midline low back pain with bilateral sciatica 05/24/2023   Spondylolisthesis  at L5-S1 level 05/24/2023   Right leg pain 05/24/2023   Right leg weakness 05/24/2023   Anemia of chronic renal failure, stage 4 (severe) (HCC) 11/02/2020   Hypokalemia 12/09/2019   Carotid artery stenosis 07/05/2019   Palpitations 06/24/2019   Dyspnea on exertion 06/24/2019   Hyperparathyroidism due to renal insufficiency 03/10/2019   Renal artery stenosis 03/10/2019   Cardiac syncope 02/16/2016   Unstable angina (HCC) 11/24/2015   CAD (coronary artery disease) 10/21/2015   S/P cardiac catheterization 10/21/2015   Iron  deficiency anemia 09/14/2015   Chest pain 08/20/2015   Hematuria 12/17/2014   Allergic condition 11/04/2014   Mitral valve prolapse 11/04/2014   Peptic ulcer 11/04/2014   Gross hematuria 11/04/2014   Atrophic vaginitis 11/04/2014   Hyperlipidemia 12/11/2013   Intolerance of drug 12/11/2013   Regurgitation 12/11/2013   Coronary atherosclerosis of autologous vein bypass graft 12/11/2013   Hypertension 12/11/2013   Presence of coronary angioplasty implant and graft 12/11/2013   Statin intolerance 12/11/2013   Other specified health status 12/11/2013   Adverse effect of unspecified drugs, medicaments and biological substances, initial encounter 12/11/2013   Anemia 09/16/2013   Chronic urinary tract infection 09/16/2013   Irritable bowel syndrome (IBS) 09/16/2013   CKD (chronic kidney disease) stage 4, GFR 15-29 ml/min (HCC) 09/16/2013   Echocardiogram abnormal 08/02/2007   Arteriosclerosis of coronary artery 09/28/2005   Atherosclerotic heart disease of native coronary artery without angina pectoris 09/28/2005   Gastro-esophageal reflux disease without esophagitis 06/30/2003    ONSET DATE: since July 2025 balance has started decreasing  REFERRING DIAG: R26.89 (ICD-10-CM) - Other abnormalities of gait and mobility  THERAPY DIAG:  Muscle weakness (generalized)  Frequent falls  Impaired ambulation  Abnormality of gait due to impairment of  balance  Rationale for Evaluation and Treatment: Rehabilitation  SUBJECTIVE:                                                                                                                                                                                             SUBJECTIVE STATEMENT:  04/18/2024: Patient reports  having 3 falls in 2 weeks and went to ED on 12/4 - admitted and then discharged on 12/5 - Diagnosed with syncope- Follow up with cardiology on 05/06/2024 and sent home with holter monitor - til 12/18.    From previous visit: Reported a fall at her brother's house on Thanksgiving day where she completely blacked out and fell, causing bruises on B LE. Pt reports this is the first time it has occurred. Also reported a near fainting spell this past Saturday and had to sit down due to a drop in BP.  From Eval:Pt reports feeling okay today. Pt reports blurry vision consistently from a film over eyes from past eye surgery causing blurriness. Pt reports towards the end of July 2025 she began having difficulty with speech and getting words out. Pt reports sensory loss and tingling in the R and L LE, more so in the R. Reports it is the most severe in the R and L lateral border of her feet, and up the lateral sides of her calf, and to her knees. Pt reports most of her falls occur in the backwards direction. Reports she has had regular headaches everyday for a very long time. Reports she feels like she's floating when she gets up to walk when asked to describe her dizziness. Pt states she uses a SPC and a rollator with a seat while ambulating around her home and in the community. Seated Vitals- BP:116/58 HR: 76  Standing Vitals- BP: 122/54  HR 73  PERTINENT HISTORY:  From AMB refferral 03/05/24: History and Present Illness: Patient states she has been having issues with her speech, gait, and dizziness since around July 2025. Symptoms came on gradually. Speech has gotten better but dizziness and  gait have gotten worse. She has some associated headaches. No associated tinnitus or leg weakness. MRI was negative for stroke. Sensation of 'staggering' when walking. No orthostasis. Assessment and Plan: Gait disturbance and imbalance with dizziness due to silent stroke and moderate microvascular ischemic brain changes. She is at higher risk for falls and further strokes. Lack of movement exacerbates balance issues. Encourage use of walker with a seat to prevent falls and promote mobility. Advise reducing prolonged sitting and screen time to improve balance and prevent further deterioration. 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  PAIN:  Are you having pain? No R foot pain, numbness in both feet  PRECAUTIONS: None  RED FLAGS: None   WEIGHT BEARING RESTRICTIONS: No  FALLS: Has patient fallen in last 6 months? Yes. Number of falls 5, reports they were due to fumbling of her feet and one time was from the stairs into the garage; reports most falls occur in the backwards direction  LIVING ENVIRONMENT: Lives with: lives alone Lives in: House/apartment Stairs: 3 steps into the house, 3 on the sides, rails on both sets Has following equipment at home: Single point cane and rollator  PLOF: Independent  PATIENT GOALS: She would like to be able to walk without getting dizzy or fumbling her feet like she's drunk all the time.  OBJECTIVE:  Note: Objective measures were completed at Evaluation unless otherwise noted.  DIAGNOSTIC FINDINGS:  From 11/22/2023 CEREBRAL MRI WO Contrast IMPRESSION: 1. No acute intracranial abnormality. 2. Moderate chronic small vessel ischemic disease.  From Xray of Lumbar Spine on 08/30/2023: XRs of the lumbar spine from 08/30/2023 were independently reviewed and  interpreted, showing grade 2 spondylolisthesis at L5/S1. Disc height loss  at L5/S1. No other significant degenerative  changes noted. No fracture or   dislocation seen.    COGNITION: Overall cognitive status: Within functional limits for tasks assessed   SENSATION:  Light touch: Impaired  R side:  decreased sensation on upper lateral thigh,complete lack of sensation on lateral ankle, foot, and toes L side: lateral calf decreased sensation, lateral border of feet decreased  COORDINATION: H<>S= WFL RAMPS: WFL  POSTURE: rounded shoulders, forward head, decreased lumbar lordosis, increased thoracic kyphosis, and flexed trunk   LOWER EXTREMITY MMT:    MMT Right Eval Left Eval Right 04/18/24 Left  04/18/24  Hip flexion 3 4 3+ 4  Hip extension      Hip abduction 4+ 4+ 4 4  Hip adduction 4+ 4+ 4 4  Hip internal rotation      Hip external rotation      Knee flexion 4 4+ 4 4  Knee extension 4 4+ 3+ 4  Ankle dorsiflexion 3+ 4+ 4 4  Ankle plantarflexion      Ankle inversion      Ankle eversion      (Blank rows = not tested)   TRANSFERS: Sit to stand: Complete Independence  Assistive device utilized: None     Stand to sit: Complete Independence  Assistive device utilized: None       STAIRS: Not tested GAIT: Findings: Gait Characteristics: decreased step length- Right, decreased step length- Left, decreased stride length, lateral hip instability, decreased trunk rotation, trunk flexed, and narrow BOS, Distance walked: distance into and out of clinic. Distance taken to perform functional outcome measures, Assistive device utilized:Walker - 4 wheeled, Level of assistance: CGA and Min A, and Comments: Pt showed significant decrease in postural control during ambulation with shuffling and inversion/eversion of ankles for correction seen as well.  FUNCTIONAL TESTS:  -Pt performed 5 time sit<>stand (5xSTS): 9.80s with hands, 8.26s without hands (>15 sec indicates increased fall risk)    -10 Meter Walk Test: Patient instructed to walk 10 meters (32.8 ft) as quickly and as safely as possible at their normal speed x2 and at a fast  speed x2. Time measured from 2 meter mark to 8 meter mark to accommodate ramp-up and ramp-down.  Normal speed 1 no AD: 13.14s  Normal speed 2 no AD: 11.91s  -Avg speed with no AD: 0.719m/s Normal speed 1 w/ rollator: 8.97s Normal speed 2 w/ rollator: 9.27s  -Avg speed w/ rollator: 1.063m/s Cut off scores: <0.4 m/s = household Ambulator, 0.4-0.8 m/s = limited community Ambulator, >0.8 m/s = community Ambulator, >1.2 m/s = crossing a street, <1.0 = increased fall risk MCID 0.05 m/s (small), 0.13 m/s (moderate), 0.06 m/s (significant)  (ANPTA Core Set of Outcome Measures for Adults with Neurologic Conditions, 2018)   -PT instructed pt in TUG: 11.575s avg. Trial 1:12.08s, Trial 2: 11.07s (average of 3 trials; >13.5 sec indicates increased fall risk)  6 minute walk test: able to ambulate for around 3:73m before rest break due to R foot pain and B foot numbness  Berg Balance Scale:  Item Test date: 03/28/2024  Date:  Date:   Sitting to standing 4. able to stand without using hands and stabilize independently    2. Standing unsupported 1. needs several tries to stand 30 seconds unsupported    3. Sitting with back unsupported, feet supported 4. able to sit safely and securely for 2 minutes    4. Standing to sitting 4. sits safely with minimal use of hands    5. Pivot transfer  4. able to  transfer safely with minor use of hands    6. Standing unsupported with eyes closed 0. needs help to keep from falling    7. Standing unsupported with feet together 2. able to place feet together independently but unable to hold for 30 seconds    8. Reaching forward with outstretched arms while standing 3. can reach forward 12 cm (5 inches)    9. Pick up object from the floor from standing 4. able to pick up slipper safely and easily    10. Turning to look behind over left and right shoulders while standing 2. turns sideways but only maintains balance    11. Turn 360 degrees 0. needs assistance while turning     12. Place alternate foot on step or stool while standing unsupported 1. able to complete > 2 steps needs minimal assist    13. Standing unsupported one foot in front 0. loses balance while stepping or standing    14. Standing on one leg 0. unable to try of needs assist to prevent fall      Total Score 29/56 Total Score:    Total Score:    Patient demonstrates increased fall risk as noted by score of 29  /56 on Berg Balance Scale.  (<36= high risk for falls, close to 100%; 37-45 significant >80%; 46-51 moderate >50%; 52-55 lower >25%)  Activities-specific Balance Confidence Scale:  Score: 55.63% Increased risk of falls in community-dwelling, older adults <80% (79.89%)  0% = no confidence - 100% = complete confidence (ANPTA Core Set of Outcome Measures for Adults with Neurologic Conditions, 2018)   PATIENT SURVEYS: Pt ABC score 04/02/24: 55.63% VITALS:    Seated Vitals- BP: 116/58 HR: 76         Standing Vitals- BP:122/54  HR: 73                                                                                                                TREATMENT DATE: 04/18/2024 Vitals  Prior to session: Seated: BP 103/56, HR 80 Standing: BP 97/51, HR 83 Standing BP 107/57 HR= 84    Pt performed 5 time sit<>stand (5xSTS): 13.09 sec (>15 sec indicates increased fall risk)    10 Meter Walk Test: Patient instructed to walk 10 meters (32.8 ft) as quickly and as safely as possible at their normal speed x2 and at a fast speed x2. Time measured from 2 meter mark to 8 meter mark to accommodate ramp-up and ramp-down.  Normal speed 1: 0.8 m/s Normal speed 2: 0.81m/s Average Normal speed: 0.82 m/s Cut off scores: <0.4 m/s = household Ambulator, 0.4-0.8 m/s = limited community Ambulator, >0.8 m/s = community Ambulator, >1.2 m/s = crossing a street, <1.0 = increased fall risk MCID 0.05 m/s (small), 0.13 m/s (moderate), 0.06 m/s (significant)  (ANPTA Core Set of Outcome Measures for Adults with Neurologic  Conditions, 2018)   PT instructed pt in TUG: 13.77 sec (average of 3 trials; >13.5 sec indicates increased fall risk)    [facility] PT Assessment -  04/18/24 1141       Standardized Balance Assessment   Standardized Balance Assessment Berg Balance Test      Berg Balance Test   Sit to Stand Able to stand without using hands and stabilize independently    Standing Unsupported Able to stand safely 2 minutes    Sitting with Back Unsupported but Feet Supported on Floor or Stool Able to sit safely and securely 2 minutes    Stand to Sit Sits safely with minimal use of hands    Transfers Able to transfer safely, minor use of hands    Standing Unsupported with Eyes Closed Able to stand 10 seconds with supervision    Standing Unsupported with Feet Together Able to place feet together independently and stand for 1 minute with supervision    From Standing, Reach Forward with Outstretched Arm Can reach forward >5 cm safely (2)    From Standing Position, Pick up Object from Floor Able to pick up shoe, needs supervision    From Standing Position, Turn to Look Behind Over each Shoulder Turn sideways only but maintains balance    Turn 360 Degrees Needs close supervision or verbal cueing    Standing Unsupported, Alternately Place Feet on Step/Stool Able to stand independently and safely and complete 8 steps in 20 seconds    Standing Unsupported, One Foot in Front Able to take small step independently and hold 30 seconds    Standing on One Leg Tries to lift leg/unable to hold 3 seconds but remains standing independently    Total Score 41            *Pt required CGA-min assist during all activities in today's session unless otherwise stated.*  PATIENT EDUCATION: Education details: Pt educated in performance of functional tests and measures. PT discussed with sister in law pt history of multiple TIAs being the source of pt gait and balance impairments. Person educated: Patient and Sister in social worker Education  method: Explanation Education comprehension: verbalized understanding  HOME EXERCISE PROGRAM: Access Code: [email or web address] URL: [email or web address] Date: 04/03/2024 Prepared by: Massie Dollar  Exercises - Standing March with Counter Support - 1 x daily - 7 x weekly - 3 sets - 10 reps - Mini Squat with Counter Support - 1 x daily - 7 x weekly - 3 sets - 10 reps - Heel Toe Raises with Counter Support - 1 x daily - 7 x weekly - 3 sets - 10 reps - 3 second each direction hold   GOALS: Goals reviewed with patient? No   SHORT TERM GOALS: Target date: 04/25/2024   Patient will be independent in home exercise program to improve strength/mobility for better functional independence with ADLs. Baseline: 04/18/2024- patient reports has not really started her HEP due to having fainting spells and went to ED. Goal status: INITIAL   LONG TERM GOALS: Target date: 07/11/2024   Patient will increase ABC scale score >80% to demonstrate better functional mobility and better confidence with ADLs.  Baseline: 55.63% Goal status: INITIAL   2.  Patient will increase Berg Balance score by > 6 points to demonstrate decreased fall risk during functional activities Baseline: 29: 04/18/2024= 41 Goal status: Progressing  3.  Patient will increase 10 meter walk test without using AD to >1.37m/s as to improve gait speed for better community ambulation and to reduce fall risk. Baseline: 0.747m/s; 04/18/2024= 0.8 m/s without an AD Goal status: Ongoing  4.  Patient will reduce timed up and go to <11 seconds to reduce fall  risk and demonstrate improved transfer/gait ability. Baseline: 11.575s; 04/18/2024= 13.77 sec  Goal status: ONGOING   5. Pt will increase R LE MMT scores by 1 point in order to show improvement of strength for increased ability to perform functional activities and for stability during ambulation for fall prevention.  Baseline: 04/18/2024- see MMT Chart above  Goal status:  ONGOING  ASSESSMENT:  CLINICAL IMPRESSION: Patient returned to clinic after being briefly hospitalized from 12/4-12/5 with syncope. She presents today with ongoing BLE muscle weakness, Pain limited mobility due to chronic sciatica, impaired balance with increased risk of falling despite much improved BERG score. All initial goals remain relevant and patient exhibits good motivation to improve her condition. She has been limited by ongoing syncopal eqisodes and does have f/u cardiology Pt will continue to benefit from skilled therapy to address remaining deficits in order to improve overall QoL and return to PLOF.    OBJECTIVE IMPAIRMENTS: Abnormal gait, decreased activity tolerance, decreased balance, decreased endurance, decreased knowledge of condition, decreased knowledge of use of DME, decreased mobility, difficulty walking, decreased strength, decreased safety awareness, dizziness, impaired sensation, impaired vision/preception, improper body mechanics, and postural dysfunction.   ACTIVITY LIMITATIONS: carrying, lifting, bending, standing, reach over head, and locomotion level  PARTICIPATION LIMITATIONS: community activity and yard work  PERSONAL FACTORS: Age, Past/current experiences, Time since onset of injury/illness/exacerbation, and 3+ comorbidities: CAD, CKD, unstable angina are also affecting patient's functional outcome.   REHAB POTENTIAL: Good  CLINICAL DECISION MAKING: Evolving/moderate complexity  EVALUATION COMPLEXITY: Moderate  PLAN:  PT FREQUENCY: 1-2x/week  PT DURATION: 12 weeks  PLANNED INTERVENTIONS: [id number]- PT Re-evaluation, 97750- Physical Performance Testing, 97110-Therapeutic exercises, 97530- Therapeutic activity, 97112- Neuromuscular re-education, 97535- Self Care, [id number]- Manual therapy, [phone]- Gait training, [phone]- Orthotic Initial, [phone]- Orthotic/Prosthetic subsequent, [phone]- Aquatic Therapy, [phone]- Electrical stimulation (unattended), [phone] (1-2 muscles),  [phone] (3+ muscles)- Dry Needling, Patient/Family education, Balance training, Stair training, Joint mobilization, Joint manipulation, Spinal manipulation, Spinal mobilization, Vestibular training, DME instructions, Wheelchair mobility training, Cryotherapy, and Moist heat  PLAN FOR NEXT SESSION:  -monitor vitals and assess ongoing bouts of syncope -continue dynamic balance training, EO/EC, varying BoS and surface -look at ambulation/balance during head turning -review HEP and add balance activities    Chyrl London, PT Physical Therapist  [place] at [facility] [phone] 04/18/2024, 5:14 PM

## 2024-04-22 ENCOUNTER — Ambulatory Visit: Admitting: Dermatology

## 2024-04-22 ENCOUNTER — Encounter: Payer: Self-pay | Admitting: Dermatology

## 2024-04-22 DIAGNOSIS — L853 Xerosis cutis: Secondary | ICD-10-CM

## 2024-04-22 DIAGNOSIS — L209 Atopic dermatitis, unspecified: Secondary | ICD-10-CM

## 2024-04-22 MED ORDER — HYDROCORTISONE 2.5 % EX CREA: TOPICAL_CREAM | CUTANEOUS | 2 refills | Status: AC

## 2024-04-22 MED ORDER — TRIAMCINOLONE ACETONIDE 0.1 % EX CREA: TOPICAL_CREAM | CUTANEOUS | 2 refills | Status: AC

## 2024-04-22 NOTE — Patient Instructions (Addendum)
 Apply triamcinolone  0.1% cream twice a day as needed to affected body areas until rash has resolved. Avoid applying to face, groin, and axilla. Use as directed. Long-term use can cause thinning of the skin.  Apply hydrocortisone  2.5%% cream twice a day as needed to affected areas on face until rash has resolved. Use as directed. Long-term use can cause thinning of the skin.  Topical steroids (such as triamcinolone , fluocinolone, fluocinonide, mometasone, clobetasol, halobetasol, betamethasone, hydrocortisone ) can cause thinning and lightening of the skin if they are used for too long in the same area. Your physician has selected the right strength medicine for your problem and area affected on the body. Please use your medication only as directed by your physician to prevent side effects.        Gentle Skin Care Guide  1. Bathe no more than once a day.  2. Avoid bathing in hot water   3. Use a mild soap like Dove, Vanicream, Cetaphil, CeraVe. Can use Lever 2000 or Cetaphil antibacterial soap  4. Use soap only where you need it. On most days, use it under your arms, between your legs, and on your feet. Let the water  rinse other areas unless visibly dirty.  5. When you get out of the bath/shower, use a towel to gently blot your skin dry, don't rub it.  6. While your skin is still a little damp, apply a moisturizing cream such as Vanicream, CeraVe, Cetaphil, Eucerin, Sarna lotion or plain Vaseline Jelly. For hands apply Neutrogena Norwegian Hand Cream or Excipial Hand Cream.  7. Reapply moisturizer any time you start to itch or feel dry.  8. Sometimes using free and clear laundry detergents can be helpful. Fabric softener sheets should be avoided. Downy Free & Gentle liquid, or any liquid fabric softener that is free of dyes and perfumes, it acceptable to use  9. If your doctor has given you prescription creams you may apply moisturizers over them     Due to recent changes in healthcare  laws, you may see results of your pathology and/or laboratory studies on MyChart before the doctors have had a chance to review them. We understand that in some cases there may be results that are confusing or concerning to you. Please understand that not all results are received at the same time and often the doctors may need to interpret multiple results in order to provide you with the best plan of care or course of treatment. Therefore, we ask that you please give us  2 business days to thoroughly review all your results before contacting the office for clarification. Should we see a critical lab result, you will be contacted sooner.   If You Need Anything After Your Visit  If you have any questions or concerns for your doctor, please call our main line at [phone] and press option 4 to reach your doctor's medical assistant. If no one answers, please leave a voicemail as directed and we will return your call as soon as possible. Messages left after 4 pm will be answered the following business day.   You may also send us  a message via MyChart. We typically respond to MyChart messages within 1-2 business days.  For prescription refills, please ask your pharmacy to contact our office. Our fax number is [phone].  If you have an urgent issue when the clinic is closed that cannot wait until the next business day, you can page your doctor at the number below.    Please note that while we  do our best to be available for urgent issues outside of office hours, we are not available 24/7.   If you have an urgent issue and are unable to reach us , you may choose to seek medical care at your doctor's office, retail clinic, urgent care center, or emergency room.  If you have a medical emergency, please immediately call 911 or go to the emergency department.  Pager Numbers  - Dr. Hester: [phone]  - Dr. Jackquline: [phone]  - Dr. Claudene: [phone]   - Dr. Raymund: [phone]  In the  event of inclement weather, please call our main line at [phone] for an update on the status of any delays or closures.  Dermatology Medication Tips: Please keep the boxes that topical medications come in in order to help keep track of the instructions about where and how to use these. Pharmacies typically print the medication instructions only on the boxes and not directly on the medication tubes.   If your medication is too expensive, please contact our office at [phone] option 4 or send us  a message through MyChart.   We are unable to tell what your co-pay for medications will be in advance as this is different depending on your insurance coverage. However, we may be able to find a substitute medication at lower cost or fill out paperwork to get insurance to cover a needed medication.   If a prior authorization is required to get your medication covered by your insurance company, please allow us  1-2 business days to complete this process.  Drug prices often vary depending on where the prescription is filled and some pharmacies may offer cheaper prices.  The website www.goodrx.com contains coupons for medications through different pharmacies. The prices here do not account for what the cost may be with help from insurance (it may be cheaper with your insurance), but the website can give you the price if you did not use any insurance.  - You can print the associated coupon and take it with your prescription to the pharmacy.  - You may also stop by our office during regular business hours and pick up a GoodRx coupon card.  - If you need your prescription sent electronically to a different pharmacy, notify our office through [facility] or by phone at [phone] option 4.     Si Usted Necesita Algo Despus de Su Visita  Tambin puede enviarnos un mensaje a travs de Clinical Cytogeneticist. Por lo general respondemos a los mensajes de MyChart en el transcurso de 1 a 2 das hbiles.  Para  renovar recetas, por favor pida a su farmacia que se ponga en contacto con nuestra oficina. Randi lakes de fax es [place] [phone].  Si tiene un asunto urgente cuando la clnica est cerrada y que no puede esperar hasta el siguiente da hbil, puede llamar/localizar a su doctor(a) al nmero que aparece a continuacin.   Por favor, tenga en cuenta que aunque hacemos todo lo posible para estar disponibles para asuntos urgentes fuera del horario de [place], no estamos disponibles las 24 horas del da, los 7 [address]  [address] de la [place].   Si tiene un problema urgente y no puede comunicarse con nosotros, puede optar por buscar atencin mdica  en el consultorio de su doctor(a), en una clnica privada, en un centro de atencin urgente o en una sala de emergencias.  Si tiene engineer, drilling, por favor llame inmediatamente al 911 o vaya a la sala de emergencias.  Nmeros de bper  - Dr.  Hester: [phone]  - Dra. Jackquline: [phone]  - Dr. Claudene: [phone]  - Dra. Kitts: [phone]  En caso de inclemencias del [place], por favor llame a nuestra lnea principal al [phone] para una actualizacin sobre el estado de cualquier retraso o cierre.  Consejos para la medicacin en dermatologa: Por favor, guarde las cajas en las que vienen los medicamentos de uso tpico para ayudarle a seguir las instrucciones sobre dnde y cmo usarlos. Las farmacias generalmente imprimen las instrucciones del medicamento slo en las cajas y no directamente en los tubos del [place].   Si su medicamento es muy caro, por favor, pngase en contacto con landry rieger llamando al [phone] y presione la opcin 4 o envenos un mensaje a travs de Clinical Cytogeneticist.   No podemos decirle cul ser su copago por los medicamentos por adelantado ya que esto es diferente dependiendo de la cobertura de su seguro. Sin embargo, es posible que podamos encontrar un medicamento sustituto a audiological scientist un formulario para  que el seguro cubra el medicamento que se considera necesario.   Si se requiere una autorizacin previa para que su compaa de seguros cubra su medicamento, por favor permtanos de 1 a 2 das hbiles para completar este proceso.  Los precios de los medicamentos varan con frecuencia dependiendo del environmental consultant de dnde se surte la receta y alguna farmacias pueden ofrecer precios ms baratos.  El sitio web www.goodrx.com tiene cupones para medicamentos de health and safety inspector. Los precios aqu no tienen en cuenta lo que podra costar con la ayuda del seguro (puede ser ms barato con su seguro), pero el sitio web puede darle el precio si no utiliz tourist information centre manager.  - Puede imprimir el cupn correspondiente y llevarlo con su receta a la farmacia.  - Tambin puede pasar por nuestra oficina durante el horario de atencin regular y education officer, museum una tarjeta de cupones de GoodRx.  - Si necesita que su receta se enve electrnicamente a una farmacia diferente, informe a nuestra oficina a travs de MyChart de [place] o por telfono llamando al [phone] y presione la opcin 4.

## 2024-04-22 NOTE — Progress Notes (Signed)
° °  New Patient Visit   Subjective  [name] is a 78 y.o. female who presents for the following: Rash. Started on legs, now spreading to face, arms, hands. Dur: ~2 - 3 weeks. Oatmeal baths did not help. No Rx treatment. No new medications prior to rash. No new laundry detergent. Using Eucerin cream.  This patient is accompanied in the office by her sister-in-law.   The following portions of the chart were reviewed this encounter and updated as appropriate: medications, allergies, medical history  Review of Systems:  No other skin or systemic complaints except as noted in HPI or Assessment and Plan.  Objective  Well appearing patient in no apparent distress; mood and affect are within normal limits.  A focused examination was performed of the following areas: Face, arms, legs  Relevant exam findings are noted in the Assessment and Plan.    Assessment & Plan   ATOPIC DERMATITIS, UNSPECIFIED TYPE   XEROSIS CUTIS    Atopic dermatitis and xerosis with possible allergic/irritant contact dermatitis component from new products  Exam: Scaly pink papules coalescing to plaques at hands, face and thighs  Atopic dermatitis (eczema) is a chronic, relapsing, pruritic condition that can significantly affect quality of life. It is often associated with allergic rhinitis and/or asthma and can require treatment with topical medications, phototherapy, or in severe cases biologic injectable medication (Dupixent, Adbry, Ebglyss) or oral JAK inhibitors (Rinvoq, Cibinqo).   Treatment Plan: Apply triamcinolone  0.1% cream twice a day as needed to affected body areas until rash has resolved. Avoid applying to face, groin, and axilla. Use as directed. Long-term use can cause thinning of the skin.  Apply hydrocortisone  2.5%% cream twice a day as needed to affected areas on face until rash has resolved. Use as directed. Long-term use can cause thinning of the skin. Acne periorificial  dermatitis Avoid use of new facial products that may have triggered flare  Topical steroids (such as triamcinolone , fluocinolone, fluocinonide, mometasone, clobetasol, halobetasol, betamethasone, hydrocortisone ) can cause thinning and lightening of the skin if they are used for too long in the same area. Your physician has selected the right strength medicine for your problem and area affected on the body. Please use your medication only as directed by your physician to prevent side effects.     Return in about 1 month (around 05/23/2024) for Dermatitis follow up.  I, Jill Parcell, CMA, am acting as scribe for Boneta Sharps, MD.   Documentation: I have reviewed the above documentation for accuracy and completeness, and I agree with the above.  Boneta Sharps, MD

## 2024-04-23 ENCOUNTER — Ambulatory Visit

## 2024-04-25 ENCOUNTER — Ambulatory Visit: Admitting: Physical Therapy

## 2024-04-30 ENCOUNTER — Ambulatory Visit

## 2024-04-30 DIAGNOSIS — R262 Difficulty in walking, not elsewhere classified: Secondary | ICD-10-CM

## 2024-04-30 DIAGNOSIS — M6281 Muscle weakness (generalized): Secondary | ICD-10-CM | POA: Diagnosis not present

## 2024-04-30 DIAGNOSIS — R296 Repeated falls: Secondary | ICD-10-CM

## 2024-04-30 DIAGNOSIS — R2689 Other abnormalities of gait and mobility: Secondary | ICD-10-CM

## 2024-04-30 NOTE — Therapy (Signed)
 " OUTPATIENT PHYSICAL THERAPY TREATMENT   Patient Name: [name] MRN: [id number] DOB:[birth year], 78 y.o., female Today's Date: 05/01/2024   PCP: Sadie Manna, MD  REFERRING PROVIDER: Maree Jannett MARLA, MD  END OF SESSION:  PT End of Session - 04/30/24 1612     Visit Number 5    Number of Visits 28    Date for Recertification  07/11/24    Progress Note Due on Visit 10    PT Start Time 1613    PT Stop Time 1700    PT Time Calculation (min) 47 min    Equipment Utilized During Treatment Gait belt    Activity Tolerance Patient tolerated treatment well    Behavior During Therapy WFL for tasks assessed/performed          Past Medical History:  Diagnosis Date   Acid reflux    Anemia    CAD (coronary artery disease)    Chronic constipation    Chronic kidney disease    stage 3 chronic kidney disease   Eczema    H/O heart artery stent    Hyperlipidemia    Hypertension    IBS (irritable bowel syndrome) 2004   Migraine    Mitral valve prolapse    Peptic ulcer 1989   Skin cancer    Past Surgical History:  Procedure Laterality Date   ABDOMINAL HYSTERECTOMY     APPENDECTOMY     BREAST BIOPSY Left 07/14/2021   left stereo bx ribbon clip path stromal fibrosis   CARDIAC CATHETERIZATION N/A 10/21/2015   Procedure: Left Heart Cath and Coronary Angiography;  Surgeon: Marsa Dooms, MD;  Location: [facility];  Service: Cardiovascular;  Laterality: N/A;   CARDIAC CATHETERIZATION N/A 10/21/2015   Procedure: Coronary Stent Intervention;  Surgeon: Marsa Dooms, MD;  Location: [facility];  Service: Cardiovascular;  Laterality: N/A;   CARDIAC CATHETERIZATION Left 11/24/2015   Procedure: Coronary Stent Intervention;  Surgeon: Marsa Dooms, MD;  Location: [facility];  Service: Cardiovascular;  Laterality: Left;   CARDIAC SURGERY  2007   cardiac stent place   CATARACT EXTRACTION  2012   CHOLECYSTECTOMY     CORONARY  ANGIOPLASTY WITH STENT PLACEMENT  2013   CORONARY ARTERY BYPASS GRAFT  2008   CORONARY STENT INTERVENTION N/A 07/04/2017   Procedure: CORONARY STENT INTERVENTION;  Surgeon: Dooms Marsa, MD;  Location: [facility];  Service: Cardiovascular;  Laterality: N/A;   CORONARY STENT INTERVENTION N/A 08/08/2017   Procedure: CORONARY STENT INTERVENTION;  Surgeon: Dooms Marsa, MD;  Location: [facility];  Service: Cardiovascular;  Laterality: N/A;   CYSTOSCOPY W/ RETROGRADES Bilateral 12/29/2014   Procedure: CYSTOSCOPY WITH RETROGRADE PYELOGRAM;  Surgeon: Charlie JONETTA Pack, MD;  Location: [facility];  Service: Urology;  Laterality: Bilateral;   CYSTOSCOPY WITH BIOPSY N/A 12/29/2014   Procedure: CYSTOSCOPY WITH BIOPSY;  Surgeon: Charlie JONETTA Pack, MD;  Location: [facility];  Service: Urology;  Laterality: N/A;   HAND SURGERY  1998   LEFT HEART CATH AND CORONARY ANGIOGRAPHY Left 08/08/2017   Procedure: LEFT HEART CATH AND CORONARY ANGIOGRAPHY;  Surgeon: Dooms Marsa, MD;  Location: [facility];  Service: Cardiovascular;  Laterality: Left;   LEFT HEART CATH AND CORONARY ANGIOGRAPHY Left 08/01/2019   Procedure: LEFT HEART CATH AND CORONARY ANGIOGRAPHY;  Surgeon: Dooms Marsa, MD;  Location: [facility];  Service: Cardiovascular;  Laterality: Left;   LEFT HEART CATH AND CORS/GRAFTS ANGIOGRAPHY N/A 07/04/2017   Procedure: LEFT  HEART CATH AND CORS/GRAFTS ANGIOGRAPHY;  Surgeon: Ammon Blunt, MD;  Location: [facility];  Service: Cardiovascular;  Laterality: N/A;   SIGMOID RESECTION / RECTOPEXY  2006   SKIN CANCER EXCISION  2013   TENDON REPAIR     right elbow   TRIGGER FINGER RELEASE     Patient Active Problem List   Diagnosis Date Noted   Syncope and collapse 04/11/2024   Age-related osteoporosis without current pathological fracture 09/20/2023   Chronic midline low back pain with bilateral sciatica 05/24/2023   Spondylolisthesis  at L5-S1 level 05/24/2023   Right leg pain 05/24/2023   Right leg weakness 05/24/2023   Anemia of chronic renal failure, stage 4 (severe) (HCC) 11/02/2020   Hypokalemia 12/09/2019   Carotid artery stenosis 07/05/2019   Palpitations 06/24/2019   Dyspnea on exertion 06/24/2019   Hyperparathyroidism due to renal insufficiency 03/10/2019   Renal artery stenosis 03/10/2019   Cardiac syncope 02/16/2016   Unstable angina (HCC) 11/24/2015   CAD (coronary artery disease) 10/21/2015   S/P cardiac catheterization 10/21/2015   Iron  deficiency anemia 09/14/2015   Chest pain 08/20/2015   Hematuria 12/17/2014   Allergic condition 11/04/2014   Mitral valve prolapse 11/04/2014   Peptic ulcer 11/04/2014   Gross hematuria 11/04/2014   Atrophic vaginitis 11/04/2014   Hyperlipidemia 12/11/2013   Intolerance of drug 12/11/2013   Regurgitation 12/11/2013   Coronary atherosclerosis of autologous vein bypass graft 12/11/2013   Hypertension 12/11/2013   Presence of coronary angioplasty implant and graft 12/11/2013   Statin intolerance 12/11/2013   Other specified health status 12/11/2013   Adverse effect of unspecified drugs, medicaments and biological substances, initial encounter 12/11/2013   Anemia 09/16/2013   Chronic urinary tract infection 09/16/2013   Irritable bowel syndrome (IBS) 09/16/2013   CKD (chronic kidney disease) stage 4, GFR 15-29 ml/min (HCC) 09/16/2013   Echocardiogram abnormal 08/02/2007   Arteriosclerosis of coronary artery 09/28/2005   Atherosclerotic heart disease of native coronary artery without angina pectoris 09/28/2005   Gastro-esophageal reflux disease without esophagitis 06/30/2003    ONSET DATE: since July 2025 balance has started decreasing  REFERRING DIAG: R26.89 (ICD-10-CM) - Other abnormalities of gait and mobility  THERAPY DIAG:  Muscle weakness (generalized)  Frequent falls  Impaired ambulation  Abnormality of gait due to impairment of  balance  Rationale for Evaluation and Treatment: Rehabilitation  SUBJECTIVE:                                                                                                                                                                                             SUBJECTIVE STATEMENT:  05/01/2024: Patient reports  feeling okay - no falls  From previous visit: Reported a fall at her brother's house on Thanksgiving day where she completely blacked out and fell, causing bruises on B LE. Pt reports this is the first time it has occurred. Also reported a near fainting spell this past Saturday and had to sit down due to a drop in BP.  From Eval:Pt reports feeling okay today. Pt reports blurry vision consistently from a film over eyes from past eye surgery causing blurriness. Pt reports towards the end of July 2025 she began having difficulty with speech and getting words out. Pt reports sensory loss and tingling in the R and L LE, more so in the R. Reports it is the most severe in the R and L lateral border of her feet, and up the lateral sides of her calf, and to her knees. Pt reports most of her falls occur in the backwards direction. Reports she has had regular headaches everyday for a very long time. Reports she feels like she's floating when she gets up to walk when asked to describe her dizziness. Pt states she uses a SPC and a rollator with a seat while ambulating around her home and in the community. Seated Vitals- BP:116/58 HR: 76  Standing Vitals- BP: 122/54  HR 73  PERTINENT HISTORY:  From AMB refferral 03/05/24: History and Present Illness: Patient states she has been having issues with her speech, gait, and dizziness since around July 2025. Symptoms came on gradually. Speech has gotten better but dizziness and gait have gotten worse. She has some associated headaches. No associated tinnitus or leg weakness. MRI was negative for stroke. Sensation of 'staggering' when walking. No  orthostasis. Assessment and Plan: Gait disturbance and imbalance with dizziness due to silent stroke and moderate microvascular ischemic brain changes. She is at higher risk for falls and further strokes. Lack of movement exacerbates balance issues. Encourage use of walker with a seat to prevent falls and promote mobility. Advise reducing prolonged sitting and screen time to improve balance and prevent further deterioration. 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  PAIN:  Are you having pain? No R foot pain, numbness in both feet  PRECAUTIONS: None  RED FLAGS: None   WEIGHT BEARING RESTRICTIONS: No  FALLS: Has patient fallen in last 6 months? Yes. Number of falls 5, reports they were due to fumbling of her feet and one time was from the stairs into the garage; reports most falls occur in the backwards direction  LIVING ENVIRONMENT: Lives with: lives alone Lives in: House/apartment Stairs: 3 steps into the house, 3 on the sides, rails on both sets Has following equipment at home: Single point cane and rollator  PLOF: Independent  PATIENT GOALS: She would like to be able to walk without getting dizzy or fumbling her feet like she's drunk all the time.  OBJECTIVE:  Note: Objective measures were completed at Evaluation unless otherwise noted.  DIAGNOSTIC FINDINGS:  From 11/22/2023 CEREBRAL MRI WO Contrast IMPRESSION: 1. No acute intracranial abnormality. 2. Moderate chronic small vessel ischemic disease.  From Xray of Lumbar Spine on 08/30/2023: XRs of the lumbar spine from 08/30/2023 were independently reviewed and  interpreted, showing grade 2 spondylolisthesis at L5/S1. Disc height loss  at L5/S1. No other significant degenerative changes noted. No fracture or  dislocation seen.    COGNITION: Overall cognitive status: Within functional limits for tasks assessed   SENSATION:  Light touch: Impaired  R side:  decreased sensation on  upper lateral thigh,complete lack of sensation on lateral ankle, foot, and toes L side: lateral calf decreased sensation, lateral border of feet decreased  COORDINATION: H<>S= WFL RAMPS: WFL  POSTURE: rounded shoulders, forward head, decreased lumbar lordosis, increased thoracic kyphosis, and flexed trunk   LOWER EXTREMITY MMT:    MMT Right Eval Left Eval Right 04/18/24 Left  04/18/24  Hip flexion 3 4 3+ 4  Hip extension      Hip abduction 4+ 4+ 4 4  Hip adduction 4+ 4+ 4 4  Hip internal rotation      Hip external rotation      Knee flexion 4 4+ 4 4  Knee extension 4 4+ 3+ 4  Ankle dorsiflexion 3+ 4+ 4 4  Ankle plantarflexion      Ankle inversion      Ankle eversion      (Blank rows = not tested)   TRANSFERS: Sit to stand: Complete Independence  Assistive device utilized: None     Stand to sit: Complete Independence  Assistive device utilized: None       STAIRS: Not tested GAIT: Findings: Gait Characteristics: decreased step length- Right, decreased step length- Left, decreased stride length, lateral hip instability, decreased trunk rotation, trunk flexed, and narrow BOS, Distance walked: distance into and out of clinic. Distance taken to perform functional outcome measures, Assistive device utilized:Walker - 4 wheeled, Level of assistance: CGA and Min A, and Comments: Pt showed significant decrease in postural control during ambulation with shuffling and inversion/eversion of ankles for correction seen as well.  FUNCTIONAL TESTS:  -Pt performed 5 time sit<>stand (5xSTS): 9.80s with hands, 8.26s without hands (>15 sec indicates increased fall risk)    -10 Meter Walk Test: Patient instructed to walk 10 meters (32.8 ft) as quickly and as safely as possible at their normal speed x2 and at a fast speed x2. Time measured from 2 meter mark to 8 meter mark to accommodate ramp-up and ramp-down.  Normal speed 1 no AD: 13.14s  Normal speed 2 no AD: 11.91s  -Avg speed with no AD:  0.774m/s Normal speed 1 w/ rollator: 8.97s Normal speed 2 w/ rollator: 9.27s  -Avg speed w/ rollator: 1.08m/s Cut off scores: <0.4 m/s = household Ambulator, 0.4-0.8 m/s = limited community Ambulator, >0.8 m/s = community Ambulator, >1.2 m/s = crossing a street, <1.0 = increased fall risk MCID 0.05 m/s (small), 0.13 m/s (moderate), 0.06 m/s (significant)  (ANPTA Core Set of Outcome Measures for Adults with Neurologic Conditions, 2018)   -PT instructed pt in TUG: 11.575s avg. Trial 1:12.08s, Trial 2: 11.07s (average of 3 trials; >13.5 sec indicates increased fall risk)  6 minute walk test: able to ambulate for around 3:3m before rest break due to R foot pain and B foot numbness  Berg Balance Scale:  Item Test date: 03/28/2024  Date:  Date:   Sitting to standing 4. able to stand without using hands and stabilize independently    2. Standing unsupported 1. needs several tries to stand 30 seconds unsupported    3. Sitting with back unsupported, feet supported 4. able to sit safely and securely for 2 minutes    4. Standing to sitting 4. sits safely with minimal use of hands    5. Pivot transfer  4. able to transfer safely with minor use of hands    6. Standing unsupported with eyes closed 0. needs help to keep from falling    7. Standing unsupported with feet together 2. able to place  feet together independently but unable to hold for 30 seconds    8. Reaching forward with outstretched arms while standing 3. can reach forward 12 cm (5 inches)    9. Pick up object from the floor from standing 4. able to pick up slipper safely and easily    10. Turning to look behind over left and right shoulders while standing 2. turns sideways but only maintains balance    11. Turn 360 degrees 0. needs assistance while turning    12. Place alternate foot on step or stool while standing unsupported 1. able to complete > 2 steps needs minimal assist    13. Standing unsupported one foot in front 0. loses balance  while stepping or standing    14. Standing on one leg 0. unable to try of needs assist to prevent fall      Total Score 29/56 Total Score:    Total Score:    Patient demonstrates increased fall risk as noted by score of 29  /56 on Berg Balance Scale.  (<36= high risk for falls, close to 100%; 37-45 significant >80%; 46-51 moderate >50%; 52-55 lower >25%)  Activities-specific Balance Confidence Scale:  Score: 55.63% Increased risk of falls in community-dwelling, older adults <80% (79.89%)  0% = no confidence - 100% = complete confidence (ANPTA Core Set of Outcome Measures for Adults with Neurologic Conditions, 2018)   PATIENT SURVEYS: Pt ABC score 04/02/24: 55.63% VITALS:    Seated Vitals- BP: 116/58 HR: 76         Standing Vitals- BP:122/54  HR: 73                                                                                                                TREATMENT DATE: 04/30/2024 Vitals  Prior to session: Seated: BP 125/68, HR 92 Standing: BP 118/69, HR 94 Standing BP 131/71 HR= 96  NMR:  High knee march walking in //bars - hold knee up for 2 sec - down and back x 8- patient endorsed increased fatigue today yet no pain and some SOB- O2 sat =99 and HR= 102 bpm  Rockerboad -stand neutral hold 60 sec x 3 Rockerboard - A/P weight shifting x 20 reps (only initialy posterior LOB)   Discussed standing in corner with tandem standing but performed in // bars - hold 30 sec x 3 each side.   Self care- added tandem standing to HEP and instructed only to perform with someone present or in corner of home with chair in front of her.     *Pt required CGA-min assist during all activities in today's session unless otherwise stated.*  PATIENT EDUCATION: Education details: Pt educated in performance of functional tests and measures. PT discussed with sister in law pt history of multiple TIAs being the source of pt gait and balance impairments. Person educated: Patient and Sister in  social worker Education method: Explanation Education comprehension: verbalized understanding  HOME EXERCISE PROGRAM: Access Code: [email or web address] URL: [email or web address] Date: 04/03/2024 Prepared by: Massie Dollar  Exercises - Standing  March with Counter Support - 1 x daily - 7 x weekly - 3 sets - 10 reps - Mini Squat with Counter Support - 1 x daily - 7 x weekly - 3 sets - 10 reps - Heel Toe Raises with Counter Support - 1 x daily - 7 x weekly - 3 sets - 10 reps - 3 second each direction hold   GOALS: Goals reviewed with patient? No   SHORT TERM GOALS: Target date: 04/25/2024   Patient will be independent in home exercise program to improve strength/mobility for better functional independence with ADLs. Baseline: 04/18/2024- patient reports has not really started her HEP due to having fainting spells and went to ED. Goal status: INITIAL   LONG TERM GOALS: Target date: 07/11/2024   Patient will increase ABC scale score >80% to demonstrate better functional mobility and better confidence with ADLs.  Baseline: 55.63% Goal status: INITIAL   2.  Patient will increase Berg Balance score by > 6 points to demonstrate decreased fall risk during functional activities Baseline: 29: 04/18/2024= 41 Goal status: Progressing  3.  Patient will increase 10 meter walk test without using AD to >1.68m/s as to improve gait speed for better community ambulation and to reduce fall risk. Baseline: 0.77m/s; 04/18/2024= 0.8 m/s without an AD Goal status: Ongoing  4.  Patient will reduce timed up and go to <11 seconds to reduce fall risk and demonstrate improved transfer/gait ability. Baseline: 11.575s; 04/18/2024= 13.77 sec  Goal status: ONGOING   5. Pt will increase R LE MMT scores by 1 point in order to show improvement of strength for increased ability to perform functional activities and for stability during ambulation for fall prevention.  Baseline: 04/18/2024- see MMT Chart above  Goal  status: ONGOING  ASSESSMENT:  CLINICAL IMPRESSION: Patient responded well to treatment today- no dizzy or whoozy symptoms. She was able to progress to standing and working on some balance issues. She was initially unsteady with activities but did demonstrate some in session progress - notably with high knee march, tandem standing and anterior/posterior weight shifting.  Pt will continue to benefit from skilled therapy to address remaining deficits in order to improve overall QoL and return to PLOF.    OBJECTIVE IMPAIRMENTS: Abnormal gait, decreased activity tolerance, decreased balance, decreased endurance, decreased knowledge of condition, decreased knowledge of use of DME, decreased mobility, difficulty walking, decreased strength, decreased safety awareness, dizziness, impaired sensation, impaired vision/preception, improper body mechanics, and postural dysfunction.   ACTIVITY LIMITATIONS: carrying, lifting, bending, standing, reach over head, and locomotion level  PARTICIPATION LIMITATIONS: community activity and yard work  PERSONAL FACTORS: Age, Past/current experiences, Time since onset of injury/illness/exacerbation, and 3+ comorbidities: CAD, CKD, unstable angina are also affecting patient's functional outcome.   REHAB POTENTIAL: Good  CLINICAL DECISION MAKING: Evolving/moderate complexity  EVALUATION COMPLEXITY: Moderate  PLAN:  PT FREQUENCY: 1-2x/week  PT DURATION: 12 weeks  PLANNED INTERVENTIONS: [id number]- PT Re-evaluation, 97750- Physical Performance Testing, 97110-Therapeutic exercises, 97530- Therapeutic activity, [id number]- Neuromuscular re-education, 97535- Self Care, [id number]- Manual therapy, [id number]- Gait training, [phone]- Orthotic Initial, [phone]- Orthotic/Prosthetic subsequent, [phone]- Aquatic Therapy, [phone]- Electrical stimulation (unattended), [phone] (1-2 muscles), [id number] (3+ muscles)- Dry Needling, Patient/Family education, Balance training, Stair training, Joint mobilization, Joint  manipulation, Spinal manipulation, Spinal mobilization, Vestibular training, DME instructions, Wheelchair mobility training, Cryotherapy, and Moist heat  PLAN FOR NEXT SESSION:  -monitor vitals and assess ongoing bouts of syncope -continue dynamic balance training, EO/EC, varying BoS and surface -look at ambulation/balance during head turning -review HEP  and add balance activities    Chyrl London, PT Physical Therapist  [place] [facility] [phone] 05/01/2024, 9:13 PM        "

## 2024-05-07 ENCOUNTER — Ambulatory Visit: Admitting: Physical Therapy

## 2024-05-07 DIAGNOSIS — R296 Repeated falls: Secondary | ICD-10-CM

## 2024-05-07 DIAGNOSIS — R262 Difficulty in walking, not elsewhere classified: Secondary | ICD-10-CM

## 2024-05-07 DIAGNOSIS — M6281 Muscle weakness (generalized): Secondary | ICD-10-CM | POA: Diagnosis not present

## 2024-05-07 DIAGNOSIS — R2689 Other abnormalities of gait and mobility: Secondary | ICD-10-CM

## 2024-05-07 NOTE — Therapy (Signed)
 " OUTPATIENT PHYSICAL THERAPY TREATMENT   Patient Name: [name] MRN: [id number] DOB:01-May-1946, 78 y.o., female Today's Date: 05/07/2024   PCP: Sadie Manna, MD  REFERRING PROVIDER: Maree Jannett MARLA, MD  END OF SESSION:  PT End of Session - 05/07/24 1557     Visit Number 6    Number of Visits 28    Date for Recertification  07/11/24    Progress Note Due on Visit 10    PT Start Time 1617    PT Stop Time 1644    PT Time Calculation (min) 27 min    Equipment Utilized During Treatment Gait belt    Activity Tolerance Patient tolerated treatment well    Behavior During Therapy WFL for tasks assessed/performed           Past Medical History:  Diagnosis Date   Acid reflux    Anemia    CAD (coronary artery disease)    Chronic constipation    Chronic kidney disease    stage 3 chronic kidney disease   Eczema    H/O heart artery stent    Hyperlipidemia    Hypertension    IBS (irritable bowel syndrome) 2004   Migraine    Mitral valve prolapse    Peptic ulcer 1989   Skin cancer    Past Surgical History:  Procedure Laterality Date   ABDOMINAL HYSTERECTOMY     APPENDECTOMY     BREAST BIOPSY Left 07/14/2021   left stereo bx ribbon clip path stromal fibrosis   CARDIAC CATHETERIZATION N/A 10/21/2015   Procedure: Left Heart Cath and Coronary Angiography;  Surgeon: Marsa Dooms, MD;  Location: [facility];  Service: Cardiovascular;  Laterality: N/A;   CARDIAC CATHETERIZATION N/A 10/21/2015   Procedure: Coronary Stent Intervention;  Surgeon: Marsa Dooms, MD;  Location: [facility];  Service: Cardiovascular;  Laterality: N/A;   CARDIAC CATHETERIZATION Left 11/24/2015   Procedure: Coronary Stent Intervention;  Surgeon: Marsa Dooms, MD;  Location: [facility];  Service: Cardiovascular;  Laterality: Left;   CARDIAC SURGERY  2007   cardiac stent place   CATARACT EXTRACTION  2012   CHOLECYSTECTOMY     CORONARY  ANGIOPLASTY WITH STENT PLACEMENT  2013   CORONARY ARTERY BYPASS GRAFT  2008   CORONARY STENT INTERVENTION N/A 07/04/2017   Procedure: CORONARY STENT INTERVENTION;  Surgeon: Dooms Marsa, MD;  Location: [facility];  Service: Cardiovascular;  Laterality: N/A;   CORONARY STENT INTERVENTION N/A 08/08/2017   Procedure: CORONARY STENT INTERVENTION;  Surgeon: Dooms Marsa, MD;  Location: [facility];  Service: Cardiovascular;  Laterality: N/A;   CYSTOSCOPY W/ RETROGRADES Bilateral 12/29/2014   Procedure: CYSTOSCOPY WITH RETROGRADE PYELOGRAM;  Surgeon: Charlie JONETTA Pack, MD;  Location: [facility];  Service: Urology;  Laterality: Bilateral;   CYSTOSCOPY WITH BIOPSY N/A 12/29/2014   Procedure: CYSTOSCOPY WITH BIOPSY;  Surgeon: Charlie JONETTA Pack, MD;  Location: [facility];  Service: Urology;  Laterality: N/A;   HAND SURGERY  1998   LEFT HEART CATH AND CORONARY ANGIOGRAPHY Left 08/08/2017   Procedure: LEFT HEART CATH AND CORONARY ANGIOGRAPHY;  Surgeon: Dooms Marsa, MD;  Location: [facility];  Service: Cardiovascular;  Laterality: Left;   LEFT HEART CATH AND CORONARY ANGIOGRAPHY Left 08/01/2019   Procedure: LEFT HEART CATH AND CORONARY ANGIOGRAPHY;  Surgeon: Dooms Marsa, MD;  Location: [facility];  Service: Cardiovascular;  Laterality: Left;   LEFT HEART CATH AND CORS/GRAFTS ANGIOGRAPHY N/A 07/04/2017   Procedure:  LEFT HEART CATH AND CORS/GRAFTS ANGIOGRAPHY;  Surgeon: Ammon Blunt, MD;  Location: [facility];  Service: Cardiovascular;  Laterality: N/A;   SIGMOID RESECTION / RECTOPEXY  2006   SKIN CANCER EXCISION  2013   TENDON REPAIR     right elbow   TRIGGER FINGER RELEASE     Patient Active Problem List   Diagnosis Date Noted   Syncope and collapse 04/11/2024   Age-related osteoporosis without current pathological fracture 09/20/2023   Chronic midline low back pain with bilateral sciatica 05/24/2023   Spondylolisthesis  at L5-S1 level 05/24/2023   Right leg pain 05/24/2023   Right leg weakness 05/24/2023   Anemia of chronic renal failure, stage 4 (severe) (HCC) 11/02/2020   Hypokalemia 12/09/2019   Carotid artery stenosis 07/05/2019   Palpitations 06/24/2019   Dyspnea on exertion 06/24/2019   Hyperparathyroidism due to renal insufficiency 03/10/2019   Renal artery stenosis 03/10/2019   Cardiac syncope 02/16/2016   Unstable angina (HCC) 11/24/2015   CAD (coronary artery disease) 10/21/2015   S/P cardiac catheterization 10/21/2015   Iron  deficiency anemia 09/14/2015   Chest pain 08/20/2015   Hematuria 12/17/2014   Allergic condition 11/04/2014   Mitral valve prolapse 11/04/2014   Peptic ulcer 11/04/2014   Gross hematuria 11/04/2014   Atrophic vaginitis 11/04/2014   Hyperlipidemia 12/11/2013   Intolerance of drug 12/11/2013   Regurgitation 12/11/2013   Coronary atherosclerosis of autologous vein bypass graft 12/11/2013   Hypertension 12/11/2013   Presence of coronary angioplasty implant and graft 12/11/2013   Statin intolerance 12/11/2013   Other specified health status 12/11/2013   Adverse effect of unspecified drugs, medicaments and biological substances, initial encounter 12/11/2013   Anemia 09/16/2013   Chronic urinary tract infection 09/16/2013   Irritable bowel syndrome (IBS) 09/16/2013   CKD (chronic kidney disease) stage 4, GFR 15-29 ml/min (HCC) 09/16/2013   Echocardiogram abnormal 08/02/2007   Arteriosclerosis of coronary artery 09/28/2005   Atherosclerotic heart disease of native coronary artery without angina pectoris 09/28/2005   Gastro-esophageal reflux disease without esophagitis 06/30/2003    ONSET DATE: since July 2025 balance has started decreasing  REFERRING DIAG: R26.89 (ICD-10-CM) - Other abnormalities of gait and mobility  THERAPY DIAG:  Frequent falls  Impaired ambulation  Abnormality of gait due to impairment of balance  Muscle weakness  (generalized)  Rationale for Evaluation and Treatment: Rehabilitation  SUBJECTIVE:                                                                                                                                                                                             SUBJECTIVE STATEMENT:  05/07/2024: Patient  reports feeling okay - no falls  From previous visit: Reported a fall at her brother's house on Thanksgiving day where she completely blacked out and fell, causing bruises on B LE. Pt reports this is the first time it has occurred. Also reported a near fainting spell this past Saturday and had to sit down due to a drop in BP.  From Eval:Pt reports feeling okay today. Pt reports blurry vision consistently from a film over eyes from past eye surgery causing blurriness. Pt reports towards the end of July 2025 she began having difficulty with speech and getting words out. Pt reports sensory loss and tingling in the R and L LE, more so in the R. Reports it is the most severe in the R and L lateral border of her feet, and up the lateral sides of her calf, and to her knees. Pt reports most of her falls occur in the backwards direction. Reports she has had regular headaches everyday for a very long time. Reports she feels like she's floating when she gets up to walk when asked to describe her dizziness. Pt states she uses a SPC and a rollator with a seat while ambulating around her home and in the community. Seated Vitals- BP:116/58 HR: 76  Standing Vitals- BP: 122/54  HR 73  PERTINENT HISTORY:  From AMB refferral 03/05/24: History and Present Illness: Patient states she has been having issues with her speech, gait, and dizziness since around July 2025. Symptoms came on gradually. Speech has gotten better but dizziness and gait have gotten worse. She has some associated headaches. No associated tinnitus or leg weakness. MRI was negative for stroke. Sensation of 'staggering' when walking. No  orthostasis. Assessment and Plan: Gait disturbance and imbalance with dizziness due to silent stroke and moderate microvascular ischemic brain changes. She is at higher risk for falls and further strokes. Lack of movement exacerbates balance issues. Encourage use of walker with a seat to prevent falls and promote mobility. Advise reducing prolonged sitting and screen time to improve balance and prevent further deterioration. 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  PAIN:  Are you having pain? No R foot pain, numbness in both feet  PRECAUTIONS: None  RED FLAGS: None   WEIGHT BEARING RESTRICTIONS: No  FALLS: Has patient fallen in last 6 months? Yes. Number of falls 5, reports they were due to fumbling of her feet and one time was from the stairs into the garage; reports most falls occur in the backwards direction  LIVING ENVIRONMENT: Lives with: lives alone Lives in: House/apartment Stairs: 3 steps into the house, 3 on the sides, rails on both sets Has following equipment at home: Single point cane and rollator  PLOF: Independent  PATIENT GOALS: She would like to be able to walk without getting dizzy or fumbling her feet like she's drunk all the time.  OBJECTIVE:  Note: Objective measures were completed at Evaluation unless otherwise noted.  DIAGNOSTIC FINDINGS:  From 11/22/2023 CEREBRAL MRI WO Contrast IMPRESSION: 1. No acute intracranial abnormality. 2. Moderate chronic small vessel ischemic disease.  From Xray of Lumbar Spine on 08/30/2023: XRs of the lumbar spine from 08/30/2023 were independently reviewed and  interpreted, showing grade 2 spondylolisthesis at L5/S1. Disc height loss  at L5/S1. No other significant degenerative changes noted. No fracture or  dislocation seen.    COGNITION: Overall cognitive status: Within functional limits for tasks assessed   SENSATION:  Light touch: Impaired  R side:  decreased sensation  on  upper lateral thigh,complete lack of sensation on lateral ankle, foot, and toes L side: lateral calf decreased sensation, lateral border of feet decreased  COORDINATION: H<>S= WFL RAMPS: WFL  POSTURE: rounded shoulders, forward head, decreased lumbar lordosis, increased thoracic kyphosis, and flexed trunk   LOWER EXTREMITY MMT:    MMT Right Eval Left Eval Right 04/18/24 Left  04/18/24  Hip flexion 3 4 3+ 4  Hip extension      Hip abduction 4+ 4+ 4 4  Hip adduction 4+ 4+ 4 4  Hip internal rotation      Hip external rotation      Knee flexion 4 4+ 4 4  Knee extension 4 4+ 3+ 4  Ankle dorsiflexion 3+ 4+ 4 4  Ankle plantarflexion      Ankle inversion      Ankle eversion      (Blank rows = not tested)   TRANSFERS: Sit to stand: Complete Independence  Assistive device utilized: None     Stand to sit: Complete Independence  Assistive device utilized: None       STAIRS: Not tested GAIT: Findings: Gait Characteristics: decreased step length- Right, decreased step length- Left, decreased stride length, lateral hip instability, decreased trunk rotation, trunk flexed, and narrow BOS, Distance walked: distance into and out of clinic. Distance taken to perform functional outcome measures, Assistive device utilized:Walker - 4 wheeled, Level of assistance: CGA and Min A, and Comments: Pt showed significant decrease in postural control during ambulation with shuffling and inversion/eversion of ankles for correction seen as well.  FUNCTIONAL TESTS:  -Pt performed 5 time sit<>stand (5xSTS): 9.80s with hands, 8.26s without hands (>15 sec indicates increased fall risk)    -10 Meter Walk Test: Patient instructed to walk 10 meters (32.8 ft) as quickly and as safely as possible at their normal speed x2 and at a fast speed x2. Time measured from 2 meter mark to 8 meter mark to accommodate ramp-up and ramp-down.  Normal speed 1 no AD: 13.14s  Normal speed 2 no AD: 11.91s  -Avg speed with no AD:  0.791m/s Normal speed 1 w/ rollator: 8.97s Normal speed 2 w/ rollator: 9.27s  -Avg speed w/ rollator: 1.033m/s Cut off scores: <0.4 m/s = household Ambulator, 0.4-0.8 m/s = limited community Ambulator, >0.8 m/s = community Ambulator, >1.2 m/s = crossing a street, <1.0 = increased fall risk MCID 0.05 m/s (small), 0.13 m/s (moderate), 0.06 m/s (significant)  (ANPTA Core Set of Outcome Measures for Adults with Neurologic Conditions, 2018)   -PT instructed pt in TUG: 11.575s avg. Trial 1:12.08s, Trial 2: 11.07s (average of 3 trials; >13.5 sec indicates increased fall risk)  6 minute walk test: able to ambulate for around 3:77m before rest break due to R foot pain and B foot numbness  Berg Balance Scale:  Item Test date: 03/28/2024  Date:  Date:   Sitting to standing 4. able to stand without using hands and stabilize independently    2. Standing unsupported 1. needs several tries to stand 30 seconds unsupported    3. Sitting with back unsupported, feet supported 4. able to sit safely and securely for 2 minutes    4. Standing to sitting 4. sits safely with minimal use of hands    5. Pivot transfer  4. able to transfer safely with minor use of hands    6. Standing unsupported with eyes closed 0. needs help to keep from falling    7. Standing unsupported with feet together 2. able  to place feet together independently but unable to hold for 30 seconds    8. Reaching forward with outstretched arms while standing 3. can reach forward 12 cm (5 inches)    9. Pick up object from the floor from standing 4. able to pick up slipper safely and easily    10. Turning to look behind over left and right shoulders while standing 2. turns sideways but only maintains balance    11. Turn 360 degrees 0. needs assistance while turning    12. Place alternate foot on step or stool while standing unsupported 1. able to complete > 2 steps needs minimal assist    13. Standing unsupported one foot in front 0. loses balance  while stepping or standing    14. Standing on one leg 0. unable to try of needs assist to prevent fall      Total Score 29/56 Total Score:    Total Score:    Patient demonstrates increased fall risk as noted by score of 29  /56 on Berg Balance Scale.  (<36= high risk for falls, close to 100%; 37-45 significant >80%; 46-51 moderate >50%; 52-55 lower >25%)  Activities-specific Balance Confidence Scale:  Score: 55.63% Increased risk of falls in community-dwelling, older adults <80% (79.89%)  0% = no confidence - 100% = complete confidence (ANPTA Core Set of Outcome Measures for Adults with Neurologic Conditions, 2018)   PATIENT SURVEYS: Pt ABC score 04/02/24: 55.63% VITALS:    Seated Vitals- BP: 116/58 HR: 76         Standing Vitals- BP:122/54  HR: 73                                                                                                                TREATMENT DATE: 05/07/2024 Self Care Vitals  start to session: Seated: BP 84/51 Standing: BP 93/50 Standing: 89/46 Seated rest and had pt drink medium sized cup of water , explained why BP management important.  Pt instructed to go home and drink more fluids and to take further BP measurements. Sister in law who bought her to PT was instructed to check in with pt to ensure she adheres to this.    *Pt required CGA-min assist during all activities in today's session unless otherwise stated.*  PATIENT EDUCATION: Education details: Pt educated in performance of functional tests and measures. PT discussed with sister in law pt history of multiple TIAs being the source of pt gait and balance impairments. Person educated: Patient and Sister in social worker Education method: Explanation Education comprehension: verbalized understanding  HOME EXERCISE PROGRAM: Access Code: [email or web address] URL: [email or web address] Date: 04/03/2024 Prepared by: Massie Dollar  Exercises - Standing March with Counter Support - 1 x daily - 7 x weekly - 3  sets - 10 reps - Mini Squat with Counter Support - 1 x daily - 7 x weekly - 3 sets - 10 reps - Heel Toe Raises with Counter Support - 1 x daily - 7 x weekly - 3 sets - 10 reps - 3 second  each direction hold   GOALS: Goals reviewed with patient? No   SHORT TERM GOALS: Target date: 04/25/2024   Patient will be independent in home exercise program to improve strength/mobility for better functional independence with ADLs. Baseline: 04/18/2024- patient reports has not really started her HEP due to having fainting spells and went to ED. Goal status: INITIAL   LONG TERM GOALS: Target date: 07/11/2024   Patient will increase ABC scale score >80% to demonstrate better functional mobility and better confidence with ADLs.  Baseline: 55.63% Goal status: INITIAL   2.  Patient will increase Berg Balance score by > 6 points to demonstrate decreased fall risk during functional activities Baseline: 29: 04/18/2024= 41 Goal status: Progressing  3.  Patient will increase 10 meter walk test without using AD to >1.57m/s as to improve gait speed for better community ambulation and to reduce fall risk. Baseline: 0.771m/s; 04/18/2024= 0.8 m/s without an AD Goal status: Ongoing  4.  Patient will reduce timed up and go to <11 seconds to reduce fall risk and demonstrate improved transfer/gait ability. Baseline: 11.575s; 04/18/2024= 13.77 sec  Goal status: ONGOING   5. Pt will increase R LE MMT scores by 1 point in order to show improvement of strength for increased ability to perform functional activities and for stability during ambulation for fall prevention.  Baseline: 04/18/2024- see MMT Chart above  Goal status: ONGOING  ASSESSMENT:  CLINICAL IMPRESSION:  Patient presents with low blood pressure at start of session.  During seated and standing at blood pressure measurements were both low although no orthostatic change was noted.  Patient provided with medium size cup of water  an upon drinking  this still had low blood pressure.  Patient and sister-in-law who brought her here with both instructed to ensure patient checks her blood pressure when she gets home and to ensure it is at a more reasonable level prior to going to bed tonight otherwise she should seek medical attention.  Both patient and caregiver verbalized understanding of importance of instruction.Pt will continue to benefit from skilled physical therapy intervention to address impairments, improve QOL, and attain therapy goals.    OBJECTIVE IMPAIRMENTS: Abnormal gait, decreased activity tolerance, decreased balance, decreased endurance, decreased knowledge of condition, decreased knowledge of use of DME, decreased mobility, difficulty walking, decreased strength, decreased safety awareness, dizziness, impaired sensation, impaired vision/preception, improper body mechanics, and postural dysfunction.   ACTIVITY LIMITATIONS: carrying, lifting, bending, standing, reach over head, and locomotion level  PARTICIPATION LIMITATIONS: community activity and yard work  PERSONAL FACTORS: Age, Past/current experiences, Time since onset of injury/illness/exacerbation, and 3+ comorbidities: CAD, CKD, unstable angina are also affecting patient's functional outcome.   REHAB POTENTIAL: Good  CLINICAL DECISION MAKING: Evolving/moderate complexity  EVALUATION COMPLEXITY: Moderate  PLAN:  PT FREQUENCY: 1-2x/week  PT DURATION: 12 weeks  PLANNED INTERVENTIONS: [id number]- PT Re-evaluation, 97750- Physical Performance Testing, 97110-Therapeutic exercises, 97530- Therapeutic activity, [id number]- Neuromuscular re-education, 97535- Self Care, [id number]- Manual therapy, [id number]- Gait training, [phone]- Orthotic Initial, [phone]- Orthotic/Prosthetic subsequent, [phone]- Aquatic Therapy, [phone]- Electrical stimulation (unattended), [phone] (1-2 muscles), [id number] (3+ muscles)- Dry Needling, Patient/Family education, Balance training, Stair training, Joint mobilization, Joint  manipulation, Spinal manipulation, Spinal mobilization, Vestibular training, DME instructions, Wheelchair mobility training, Cryotherapy, and Moist heat  PLAN FOR NEXT SESSION:  -monitor vitals and assess ongoing bouts of syncope -continue dynamic balance training, EO/EC, varying BoS and surface -look at ambulation/balance during head turning -review HEP and add balance activities    Note: Portions of this document were prepared  using Conservation officer, historic buildings and although reviewed may contain unintentional dictation errors in syntax, grammar, or spelling.  Lonni KATHEE Gainer PT ,DPT Physical Therapist- [facility]   05/07/2024, 4:03 PM        "

## 2024-05-09 ENCOUNTER — Encounter: Payer: Self-pay | Admitting: Oncology

## 2024-05-13 ENCOUNTER — Ambulatory Visit: Admitting: Physical Therapy

## 2024-05-16 ENCOUNTER — Ambulatory Visit: Admitting: Physical Therapy

## 2024-05-21 ENCOUNTER — Ambulatory Visit

## 2024-05-21 NOTE — Addendum Note (Signed)
 Encounter addended by: Amauri Medellin L on: 05/21/2024 10:06 AM  Actions taken: Imaging Exam ended

## 2024-05-23 ENCOUNTER — Ambulatory Visit

## 2024-05-28 ENCOUNTER — Ambulatory Visit

## 2024-05-30 ENCOUNTER — Ambulatory Visit

## 2024-06-03 ENCOUNTER — Ambulatory Visit: Admitting: Dermatology

## 2024-06-04 ENCOUNTER — Ambulatory Visit

## 2024-06-06 ENCOUNTER — Ambulatory Visit

## 2024-06-11 ENCOUNTER — Ambulatory Visit

## 2024-06-13 ENCOUNTER — Ambulatory Visit

## 2024-06-14 ENCOUNTER — Other Ambulatory Visit: Payer: Self-pay | Admitting: Oncology

## 2024-06-14 ENCOUNTER — Other Ambulatory Visit: Payer: Self-pay

## 2024-06-14 DIAGNOSIS — D508 Other iron deficiency anemias: Secondary | ICD-10-CM

## 2024-06-14 DIAGNOSIS — D631 Anemia in chronic kidney disease: Secondary | ICD-10-CM

## 2024-06-17 ENCOUNTER — Inpatient Hospital Stay: Payer: Self-pay

## 2024-06-17 ENCOUNTER — Inpatient Hospital Stay: Payer: Self-pay | Attending: Oncology

## 2024-06-18 ENCOUNTER — Ambulatory Visit

## 2024-06-20 ENCOUNTER — Ambulatory Visit

## 2024-06-25 ENCOUNTER — Ambulatory Visit

## 2024-06-27 ENCOUNTER — Ambulatory Visit

## 2024-07-02 ENCOUNTER — Ambulatory Visit

## 2024-07-04 ENCOUNTER — Ambulatory Visit

## 2024-08-12 ENCOUNTER — Ambulatory Visit: Admitting: Dermatology

## 2024-08-15 ENCOUNTER — Ambulatory Visit

## 2024-08-15 ENCOUNTER — Other Ambulatory Visit

## 2024-08-15 ENCOUNTER — Ambulatory Visit: Admitting: Oncology

## 2024-09-09 ENCOUNTER — Ambulatory Visit (INDEPENDENT_AMBULATORY_CARE_PROVIDER_SITE_OTHER): Admitting: Vascular Surgery

## 2024-09-09 ENCOUNTER — Encounter (INDEPENDENT_AMBULATORY_CARE_PROVIDER_SITE_OTHER)
